# Patient Record
Sex: Male | Born: 1935 | Race: White | Hispanic: No | Smoking: Never smoker
Health system: Southern US, Community
[De-identification: ages and names within clinical notes are randomized; demographics above are authoritative.]

## PROBLEM LIST (undated history)

## (undated) DIAGNOSIS — Z860100 Personal history of colon polyps, unspecified: Secondary | ICD-10-CM

## (undated) DIAGNOSIS — Z9889 Other specified postprocedural states: Secondary | ICD-10-CM

## (undated) DIAGNOSIS — E785 Hyperlipidemia, unspecified: Secondary | ICD-10-CM

## (undated) DIAGNOSIS — F419 Anxiety disorder, unspecified: Secondary | ICD-10-CM

## (undated) DIAGNOSIS — K5790 Diverticulosis of intestine, part unspecified, without perforation or abscess without bleeding: Secondary | ICD-10-CM

## (undated) DIAGNOSIS — T7840XA Allergy, unspecified, initial encounter: Secondary | ICD-10-CM

## (undated) DIAGNOSIS — M5430 Sciatica, unspecified side: Secondary | ICD-10-CM

## (undated) DIAGNOSIS — E291 Testicular hypofunction: Secondary | ICD-10-CM

## (undated) DIAGNOSIS — Z8719 Personal history of other diseases of the digestive system: Secondary | ICD-10-CM

## (undated) HISTORY — DX: Anxiety disorder, unspecified: F41.9

## (undated) HISTORY — DX: Other specified postprocedural states: Z98.890

## (undated) HISTORY — DX: Personal history of colon polyps, unspecified: Z86.0100

## (undated) HISTORY — DX: Testicular hypofunction: E29.1

## (undated) HISTORY — DX: Diverticulosis of intestine, part unspecified, without perforation or abscess without bleeding: K57.90

## (undated) HISTORY — PX: HERNIA REPAIR: SHX51

## (undated) HISTORY — DX: Allergy, unspecified, initial encounter: T78.40XA

## (undated) HISTORY — DX: Sciatica, unspecified side: M54.30

## (undated) HISTORY — PX: CARPAL TUNNEL RELEASE: SHX101

## (undated) HISTORY — DX: Hyperlipidemia, unspecified: E78.5

## (undated) HISTORY — DX: Personal history of other diseases of the digestive system: Z87.19

## (undated) HISTORY — DX: Personal history of colonic polyps: Z86.010

---

## 1997-11-12 ENCOUNTER — Other Ambulatory Visit: Admission: RE | Admit: 1997-11-12 | Discharge: 1997-11-12 | Payer: Self-pay | Admitting: Orthopedic Surgery

## 2004-10-25 ENCOUNTER — Encounter: Admission: RE | Admit: 2004-10-25 | Discharge: 2004-10-25 | Payer: Self-pay | Admitting: Surgery

## 2004-10-27 ENCOUNTER — Ambulatory Visit (HOSPITAL_COMMUNITY): Admission: RE | Admit: 2004-10-27 | Discharge: 2004-10-27 | Payer: Self-pay | Admitting: Surgery

## 2004-10-27 ENCOUNTER — Ambulatory Visit (HOSPITAL_BASED_OUTPATIENT_CLINIC_OR_DEPARTMENT_OTHER): Admission: RE | Admit: 2004-10-27 | Discharge: 2004-10-28 | Payer: Self-pay | Admitting: Surgery

## 2005-11-08 ENCOUNTER — Ambulatory Visit: Payer: Self-pay | Admitting: Gastroenterology

## 2005-12-06 ENCOUNTER — Encounter (INDEPENDENT_AMBULATORY_CARE_PROVIDER_SITE_OTHER): Payer: Self-pay | Admitting: Specialist

## 2005-12-06 ENCOUNTER — Ambulatory Visit: Payer: Self-pay | Admitting: Gastroenterology

## 2006-05-24 ENCOUNTER — Ambulatory Visit: Payer: Self-pay | Admitting: Gastroenterology

## 2006-07-07 ENCOUNTER — Ambulatory Visit: Payer: Self-pay | Admitting: Internal Medicine

## 2007-05-30 ENCOUNTER — Encounter: Payer: Self-pay | Admitting: Internal Medicine

## 2007-07-19 ENCOUNTER — Encounter: Payer: Self-pay | Admitting: Internal Medicine

## 2007-07-19 DIAGNOSIS — Z860100 Personal history of colon polyps, unspecified: Secondary | ICD-10-CM | POA: Insufficient documentation

## 2007-07-19 DIAGNOSIS — Z9889 Other specified postprocedural states: Secondary | ICD-10-CM

## 2007-07-19 DIAGNOSIS — E785 Hyperlipidemia, unspecified: Secondary | ICD-10-CM

## 2007-07-19 DIAGNOSIS — E291 Testicular hypofunction: Secondary | ICD-10-CM

## 2007-07-19 DIAGNOSIS — Z8719 Personal history of other diseases of the digestive system: Secondary | ICD-10-CM | POA: Insufficient documentation

## 2007-07-19 DIAGNOSIS — R030 Elevated blood-pressure reading, without diagnosis of hypertension: Secondary | ICD-10-CM | POA: Insufficient documentation

## 2007-08-20 ENCOUNTER — Ambulatory Visit: Payer: Self-pay | Admitting: Internal Medicine

## 2007-08-20 DIAGNOSIS — K5730 Diverticulosis of large intestine without perforation or abscess without bleeding: Secondary | ICD-10-CM | POA: Insufficient documentation

## 2007-08-20 DIAGNOSIS — J309 Allergic rhinitis, unspecified: Secondary | ICD-10-CM | POA: Insufficient documentation

## 2007-08-20 DIAGNOSIS — E739 Lactose intolerance, unspecified: Secondary | ICD-10-CM | POA: Insufficient documentation

## 2007-08-20 DIAGNOSIS — M5430 Sciatica, unspecified side: Secondary | ICD-10-CM

## 2007-11-30 ENCOUNTER — Encounter: Payer: Self-pay | Admitting: Internal Medicine

## 2008-02-05 ENCOUNTER — Ambulatory Visit: Payer: Self-pay | Admitting: Internal Medicine

## 2008-02-05 LAB — CONVERTED CEMR LAB
HDL goal, serum: 40 mg/dL
LDL Goal: 100 mg/dL

## 2008-09-03 ENCOUNTER — Emergency Department (HOSPITAL_COMMUNITY): Admission: EM | Admit: 2008-09-03 | Discharge: 2008-09-03 | Payer: Self-pay | Admitting: Family Medicine

## 2009-01-01 ENCOUNTER — Ambulatory Visit: Payer: Self-pay | Admitting: Internal Medicine

## 2009-01-01 DIAGNOSIS — L719 Rosacea, unspecified: Secondary | ICD-10-CM

## 2009-01-01 DIAGNOSIS — F4322 Adjustment disorder with anxiety: Secondary | ICD-10-CM

## 2009-05-06 ENCOUNTER — Telehealth: Payer: Self-pay | Admitting: Internal Medicine

## 2009-09-04 ENCOUNTER — Emergency Department (HOSPITAL_COMMUNITY): Admission: EM | Admit: 2009-09-04 | Discharge: 2009-09-04 | Payer: Self-pay | Admitting: Family Medicine

## 2010-01-26 ENCOUNTER — Encounter: Payer: Self-pay | Admitting: Internal Medicine

## 2010-01-26 ENCOUNTER — Ambulatory Visit: Payer: Self-pay | Admitting: Internal Medicine

## 2010-01-26 LAB — CONVERTED CEMR LAB
BUN: 18 mg/dL (ref 6–23)
Basophils Relative: 0.7 % (ref 0.0–3.0)
Bilirubin, Direct: 0.2 mg/dL (ref 0.0–0.3)
CO2: 33 meq/L — ABNORMAL HIGH (ref 19–32)
Cholesterol: 215 mg/dL — ABNORMAL HIGH (ref 0–200)
Direct LDL: 136.2 mg/dL
Eosinophils Absolute: 0.3 10*3/uL (ref 0.0–0.7)
HCT: 46 % (ref 39.0–52.0)
HDL: 44.9 mg/dL (ref >39.00)
Hemoglobin: 15.6 g/dL (ref 13.0–17.0)
Lymphs Abs: 2.1 10*3/uL (ref 0.7–4.0)
MCHC: 33.8 g/dL (ref 30.0–36.0)
MCV: 93.7 fL (ref 78.0–100.0)
Monocytes Absolute: 0.6 10*3/uL (ref 0.1–1.0)
Neutro Abs: 4.2 10*3/uL (ref 1.4–7.7)
Neutrophils Relative %: 58.7 % (ref 43.0–77.0)
Platelets: 225 10*3/uL (ref 150.0–400.0)
RDW: 13.7 % (ref 11.5–14.6)
Sodium: 141 meq/L (ref 135–145)
Total Bilirubin: 0.8 mg/dL (ref 0.3–1.2)
Total CHOL/HDL Ratio: 5
Triglycerides: 173 mg/dL — ABNORMAL HIGH (ref 0.0–149.0)

## 2010-04-08 NOTE — Assessment & Plan Note (Signed)
Summary: wellness exam/#/cd   Vital Signs:  Patient profile:   77 year old male Height:      69 inches Weight:      178 pounds BMI:     26.38 O2 Sat:      96 % on Room air Temp:     98.4 degrees F oral Pulse rate:   64 / minute BP sitting:   130 / 60  (left arm) Cuff size:   regular  Vitals Entered By: Bill Salinas CMA (January 26, 2010 1:24 PM)  O2 Flow:  Room air  Primary Care Provider:  Jacques Navy MD   History of Present Illness: Patient presents for a preventive care visit. He reports that he is doing well. He does report that he has increased  flatus but he has no other complaints.  He is very active and 100% independent in ADLs. He lives alone and manages all his own affairs and business including balancing his checkbook. His daughters does help. He has had no falls and has no increased risk. He has no depression. He does have a girlfriend and they travel and have fun together.  Preventive Screening-Counseling & Management  Alcohol-Tobacco     Alcohol drinks/day: <1     Alcohol type: mixed drinks     >5/day in last 3 mos: yes     Smoking Status: quit     Year Quit: 1930s  Caffeine-Diet-Exercise     Caffeine use/day: none     Diet Comments: healthy     Does Patient Exercise: yes     Type of exercise: walking     Exercise (avg: min/session): <30     Times/week: <3  Hep-HIV-STD-Contraception     Hepatitis Risk: no risk noted     HIV Risk: no risk noted     STD Risk: no risk noted     Dental Visit-last 6 months no     Sun Exposure-Excessive: no  Safety-Violence-Falls     Seat Belt Use: yes     Helmet Use: n/a     Firearms in the Home: firearms in the home     Smoke Detectors: yes     Violence in the Home: no risk noted     Sexual Abuse: no     Fall Risk: slight fall risk      Drug Use:  never.        Blood Transfusions:  no.    Current Medications (verified): 1)  Vitamin D3 1000 Unit Tabs (Cholecalciferol) .... Take 1 Tablet By Mouth Once A  Day 2)  Aspirin 81 Mg  Tabs (Aspirin) .... Take 1 Tablet By Mouth Once A Day 3)  Fish Oil 1000 Mg Caps (Omega-3 Fatty Acids) .... Take 1 Tablet By Mouth Once A Day 4)  Multivitamins   Tabs (Multiple Vitamin) .... Take 1 Tablet By Mouth Once A Day 5)  Flax Seed Oil 1000 Mg Caps (Flaxseed (Linseed)) .... Take 1 Tablet By Mouth Three Times A Day 6)  Metronidazole 0.75 % Gel (Metronidazole) .... Apply Once A Day To Facial Rash Prn 7)  Alprazolam 0.5 Mg Tabs (Alprazolam) .Marland Kitchen.. 1 By Mouth Q 6 As Needed "nerves."  Allergies (verified): 1)  ! Penicillin 2)  ! Novocain 3)  ! * Bee Sting  Past History:  Past Medical History: ANXIETY DISORDER, SITUATIONAL, MILD (ICD-309.24) ROSACEA (ICD-695.3) DIVERTICULOSIS, COLON (ICD-562.10) GLUCOSE INTOLERANCE (ICD-271.3) ALLERGIC RHINITIS (ICD-477.9) SCIATICA, RIGHT (ICD-724.3) INGUINAL HERNIORRHAPHY, RIGHT, HX OF (ICD-V45.89) HEMORRHOIDS, HX OF (ICD-V12.79)  HYPOGONADISM (ICD-257.2) HYPERTENSION (ICD-401.9) HYPERLIPIDEMIA (ICD-272.4) COLONIC POLYPS, HX OF (ICD-V12.72)  Past Surgical History: Reviewed history from 02/05/2008 and no changes required. 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 He is dating- 50 y.o woman he knew her in high school.   Caffeine use/day:  none Does Patient Exercise:  yes Dental Care w/in 6 mos.:  no Sun Exposure-Excessive:  no Seat Belt Use:  yes Fall Risk:  slight fall risk Hepatitis Risk:  no risk noted HIV Risk:  no risk noted STD Risk:  no risk noted Drug Use:  never Blood Transfusions:  no  Review of Systems  The patient denies anorexia, fever, weight loss, weight gain, decreased hearing, syncope, peripheral edema, prolonged cough, hemoptysis, abdominal pain, incontinence, muscle weakness,  difficulty walking, unusual weight change, abnormal bleeding, and angioedema.         nocturia x 1  Physical Exam  General:  alert, well-developed, and well-nourished.   Head:  Normocephalic and atraumatic without obvious abnormalities. No apparent alopecia or balding. Eyes:  vision grossly intact, pupils equal, pupils round, and corneas and lenses clear.   Ears:  R ear normal and L ear normal.   Nose:  no external deformity and no external erythema.   Mouth:  upper denture, missing many lower teeth Neck:  supple, full ROM, and no thyromegaly.   Chest Wall:  no deformities and no tenderness.   Lungs:  Normal respiratory effort, chest expands symmetrically. Lungs are clear to auscultation, no crackles or wheezes. Heart:  Normal rate and regular rhythm. S1 and S2 normal without gallop, murmur, click, rub or other extra sounds. Abdomen:  soft, non-tender, no guarding, and no hepatomegaly.   Prostate:  deferred to age Msk:  normal ROM, no joint tenderness, no joint swelling, no joint warmth, and no joint deformities.   Pulses:  2+ radial pulses Extremities:  No clubbing, cyanosis, edema, or deformity noted with normal full range of motion of all joints.   Neurologic:  alert & oriented X3, cranial nerves II-XII intact, strength normal in all extremities, gait normal, and DTRs symmetrical and normal.   Skin:  turgor normal, color normal, no rashes, and no suspicious lesions.   Cervical Nodes:  no anterior cervical adenopathy and no posterior cervical adenopathy.   Psych:  Oriented X3, memory intact for recent and remote, normally interactive, good eye contact, and not anxious appearing.     Impression & Recommendations:  Problem # 1:  ROSACEA (ICD-695.3) stable without current flare  Problem # 2:  GLUCOSE INTOLERANCE (ICD-271.3) diet management only. Due for lab.  addendum - serum glucose 105 - normal range  Problem # 3:  HYPOGONADISM (ICD-257.2)  Orders: TLB-Testosterone, Total  (84403-TESTO)  testosterone low at 131.  Plan - patient candidate for testosterone replacement - will need to discuss options at return visit.  Problem # 4:  HYPERTENSION (ICD-401.9)  Orders: TLB-BMP (Basic Metabolic Panel-BMET) (80048-METABOL)  BP today: 130/60 Prior BP: 140/62 (01/01/2009)  Normal range BP - no need for medication at this level  Problem # 5:  HYPERLIPIDEMIA (ICD-272.4)  Orders: TLB-Lipid Panel (80061-LIPID) TLB-Hepatic/Liver Function Pnl (80076-HEPATIC) TLB-TSH (Thyroid Stimulating Hormone) (84443-TSH)  addendum - LDL 136  Plan - no indication for medical management           recommend heart healthy diet.  Problem # 6:  Preventive Health Care (ICD-V70.0)  Unremarkable interval history and a normal physical exam. Lab results are within normal limits except for low testosterone. Current with colorectal cancer screening with last colonoscopy in  Oct '07. Immunizations: Tetnus Dec '09; Pneumovax Dec '09. 12 lead EKG without evidence of active ischemia or injury.  In summary - a very nice man who is medically stable and doing well. He is advised/conseled to follow a heart healthy diet, to exercise on a regular basis and to win at the slots with my lucky quarter. He will return as needed or in 1 year.   Orders: Medicare -1st Annual Wellness Visit [phone])  Complete Medication List: 1)  Vitamin D3 1000 Unit Tabs (Cholecalciferol) .... Take 1 tablet by mouth once a day 2)  Aspirin 81 Mg Tabs (Aspirin) .... Take 1 tablet by mouth once a day 3)  Fish Oil 1000 Mg Caps (Omega-3 fatty acids) .... Take 1 tablet by mouth once a day 4)  Multivitamins Tabs (Multiple vitamin) .... Take 1 tablet by mouth once a day 5)  Flax Seed Oil 1000 Mg Caps (Flaxseed (linseed)) .... Take 1 tablet by mouth three times a day 6)  Metronidazole 0.75 % Gel (Metronidazole) .... Apply once a day to facial rash prn 7)  Alprazolam 0.5 Mg Tabs (Alprazolam) .Marland Kitchen.. 1 by mouth q 6 as needed  "nerves."  Other Orders: TLB-CBC Platelet - w/Differential (85025-CBCD)  Patient: Manuel Reilly Note: All result statuses are Final unless otherwise noted.  Tests: (1) Testosterone, Total (TESTO)   Testosterone         [L]  131.29 ng/dL                914.78-295.62  Tests: (2) BMP (METABOL)   Sodium                    141 mEq/L                   135-145   Potassium                 4.6 mEq/L                   3.5-5.1   Chloride                  103 mEq/L                   96-112   Carbon Dioxide       [H]  33 mEq/L                    19-32   Glucose              [H]  105 mg/dL                   13-08   BUN                       18 mg/dL                    6-57   Creatinine                0.8 mg/dL                   8.4-6.9   Calcium  9.2 mg/dL                   1.6-10.9   GFR                       103.66 mL/min               >60  Tests: (3) Lipid Panel (LIPID)   Cholesterol          [H]  215 mg/dL                   6-045     ATP III Classification            Desirable:  < 200 mg/dL                    Borderline High:  200 - 239 mg/dL               High:  > = 240 mg/dL   Triglycerides        [H]  173.0 mg/dL                 4.0-981.1     Normal:  <150 mg/dL     Borderline High:  914 - 199 mg/dL   HDL                       78.29 mg/dL                 >56.21   VLDL Cholesterol          34.6 mg/dL                  3.0-86.5  CHO/HDL Ratio:  CHD Risk                             5                    Men          Women     1/2 Average Risk     3.4          3.3     Average Risk          5.0          4.4     2X Average Risk          9.6          7.1     3X Average Risk          15.0          11.0                           Tests: (4) Hepatic/Liver Function Panel (HEPATIC)   Total Bilirubin           0.8 mg/dL                   7.8-4.6   Direct Bilirubin          0.2 mg/dL                   9.6-2.9   Alkaline Phosphatase      85 U/L  39-117    AST                       19 U/L                      0-37   ALT                       17 U/L                      0-53   Total Protein             6.8 g/dL                    1.6-1.0   Albumin                   4.1 g/dL                    9.6-0.4  Tests: (5) TSH (TSH)   FastTSH                   1.87 uIU/mL                 0.35-5.50  Tests: (6) CBC Platelet w/Diff (CBCD)   White Cell Count          7.2 K/uL                    4.5-10.5   Red Cell Count            4.91 Mil/uL                 4.22-5.81   Hemoglobin                15.6 g/dL                   54.0-98.1   Hematocrit                46.0 %                      39.0-52.0   MCV                       93.7 fl                     78.0-100.0   MCHC                      33.8 g/dL                   19.1-47.8   RDW                       13.7 %                      11.5-14.6   Platelet Count            225.0 K/uL                  150.0-400.0   Neutrophil %              58.7 %  43.0-77.0   Lymphocyte %              28.7 %                      12.0-46.0   Monocyte %                8.3 %                       3.0-12.0   Eosinophils%              3.6 %                       0.0-5.0   Basophils %               0.7 %                       0.0-3.0   Neutrophill Absolute      4.2 K/uL                    1.4-7.7   Lymphocyte Absolute       2.1 K/uL                    0.7-4.0   Monocyte Absolute         0.6 K/uL                    0.1-1.0  Eosinophils, Absolute                             0.3 K/uL                    0.0-0.7   Basophils Absolute        0.0 K/uL                    0.0-0.1  Tests: (7) Cholesterol LDL - Direct (DIRLDL)  Cholesterol LDL - Direct                             136.2 mg/dLPrescriptions: ALPRAZOLAM 0.5 MG TABS (ALPRAZOLAM) 1 by mouth q 6 as needed "nerves." Brand medically necessary #100 x 5   Entered and Authorized by:   Jacques Navy MD   Signed by:   Jacques Navy MD on 01/26/2010   Method  used:   Print then Give to Patient   RxID:   [id number] METRONIDAZOLE 0.75 % GEL (METRONIDAZOLE) apply once a day to facial rash prn  #60g x 1   Entered and Authorized by:   Jacques Navy MD   Signed by:   Jacques Navy MD on 01/26/2010   Method used:   Print then Give to Patient   RxID:   [id number]    Orders Added: 1)  Medicare -1st Annual Wellness Visit [G0438] 2)  Est. Patient Level III [84696] 3)  TLB-Testosterone, Total [84403-TESTO] 4)  TLB-BMP (Basic Metabolic Panel-BMET) [80048-METABOL] 5)  TLB-Lipid Panel [80061-LIPID] 6)  TLB-Hepatic/Liver Function Pnl [80076-HEPATIC] 7)  TLB-TSH (Thyroid Stimulating Hormone) [84443-TSH] 8)  TLB-CBC Platelet - w/Differential [85025-CBCD]  Appended Document: wellness exam/#/cd cognition - patient is highly functional in all realms of cognition - capable, competent and focused.

## 2010-04-08 NOTE — Progress Notes (Signed)
Summary: Alprazolam  Phone Note Refill Request Message from:  Fax from Pharmacy on May 06, 2009 2:53 PM  Refills Requested: Medication #1:  ALPRAZOLAM 0.5 MG TABS 1 by mouth q 6 as needed "nerves.". Initial call taken by: Lucious Groves,  May 06, 2009 2:53 PM  Follow-up for Phone Call        OK 2 ref OV w/Dr Laverne Klugh Follow-up by: Tresa Garter MD,  May 06, 2009 4:06 PM    Prescriptions: ALPRAZOLAM 0.5 MG TABS (ALPRAZOLAM) 1 by mouth q 6 as needed "nerves." Brand medically necessary #100 x 2   Entered by:   Lucious Groves   Authorized by:   Tresa Garter MD   Signed by:   Lucious Groves on 05/06/2009   Method used:   Telephoned to ...       [facility] [address] [phone]* (retail)       [address]       [place], [place]  [address]       Ph: [phone]       Fax: [phone]   RxID:   [id number]

## 2010-07-23 NOTE — Assessment & Plan Note (Signed)
[facility]                           PRIMARY CARE OFFICE NOTE   NIVIN, BRANIFF                    MRN:          [id number]  DATE:07/07/2006                            DOB:          1923/03/10    Mr. Manuel Reilly is an 77 year old Caucasian gentleman who presents to  establish for ongoing continuity care.  Patient has no active complaints  at today's visit.  Patient was formally followed by Dr. Mosetta Putt,  and because of insurance reasons, has had to change primary care  physicians.   PAST MEDICAL HISTORY:  1. Surgery:  Patient had a right herniorrhaphy in the 60s with a      repeat herniorrhaphy repair in a similar area in 2006.  2. Trauma:  None.  3. Patient had the usual childhood diseases.  4. Hypertension with lifestyle management.  5. Hyperlipidemia with lifestyle management.  6. Colon polyps.  7. Hemorrhoids.  8. Hypogonadism.   CURRENT MEDICATIONS:  1. Aspirin 81 mg daily.  2. __________ weekly.  3. AndroGel applied daily.   DRUG ALLERGIES:  PENICILLIN, NOVOCAIN, BEE STINGS.   FAMILY HISTORY:  Positive for arthritis.  Mother had colon cancer.  No  family history of lung cancer, prostate cancer, heart disease, diabetes.   SOCIAL HISTORY:  Patient is a high Garment/textile technologist.  He has been an  Multimedia programmer.  He has worked in a Education officer, environmental.  He has had  his own curb market.  He is presently considering himself retired.  He  has been married 57 years.  He has three daughters, five grandchildren,  and one great-grandchild.  Patient is totally independent in his  activities of daily living.   HABITS:  Alcohol none but a former drinker.  Tobacco none but a former  smoker.  Patient does exercise on a regular basis.   REVIEW OF SYSTEMS:  Patient has had no fevers, sweats, chills, or other  constitutional symptoms.  He has had an eye exam in approximately the  last 12 months.  HEENT:  The patient is edentulous on the  maxilla.  Has remaining  dentition on the mandible.  No hearing loss.  CARDIOVASCULAR:  The  patient has had palpitations in the past.  No chest pain, chest  discomfort, or limitations in activities.  The patient reports that he  has apneic spells as reported by his wife.  The patient does have  occasional bloating and flatus, but no other GI complaints.  GU is  negative with nocturia x0 to 1.  MUSCULOSKELETAL:  Significant for some  right arm pain.  Dermatologic and neurological is normal.   EXAMINATION:  Temperature was 98.1, blood pressure 132/69, pulse 75,  weight 185.4.  GENERAL APPEARANCE:  This is a well-nourished, well-developed gentleman  in no acute distress.  HEENT:  Normocephalic, atraumatic.  EACs and TMs normal.  The patient  has no oral lesions with dentition as noted in the review of systems.  Conjunctivae and sclerae clear.  Pupils are equal, round, and reactive  to light and accommodation.  NECK:  Supple without thyromegaly.  NODES:  No adenopathy  was noted in the cervical or supraclavicular  regions.  CHEST:  No CVA tenderness.  LUNGS:  Clear.  CARDIOVASCULAR:  2+ radial pulses.  No JVD or carotid bruits.  He had a  quiet precordium with a regular rate and rhythm without murmurs, rubs,  or gallops.  ABDOMEN:  Soft with no guarding or rebound.  No organo-splenomegaly was  noted.  No further examination conducted.   No laboratory at this time.   ASSESSMENT AND PLAN:  1. Hypertension.  The patient's blood pressure is well controlled on      present lifestyle regimen without need of medications and will      continue the same.  2. Lipids.  I have asked the patient to obtain records from Dr.      Duaine Dredge before I order laboratory.  3. Hypo-kinetism.  The patient was given refills of prescriptions for      vardenafil and AndroGel patches.  4. Health maintenance.  The patient had a colonoscopy October, I      believe, of 2007 by Dr. Claudette Head.  Report on the  chart      indicates colon polyps with diverticulosis with followup study      recommended in 10 years.  He otherwise is stable from a ti_      perspective.   SUMMARY:  He is a very pleasant gentleman who is established for ongoing  continuity care.  He was oriented to the practice, our call service, our  hospital service, and issues of access.  I have asked him to return to  see me on a p.r.n. basis or in 6 months for followup.     Rosalyn Gess Norins, MD  Electronically Signed    MEN/MedQ  DD: 07/08/2006  DT: 07/08/2006  Job #: [id number]   cc:   Angelyn Punt

## 2010-07-23 NOTE — Op Note (Signed)
NAMECHALMERS, IDDINGS             ACCOUNT NO.:  [id number]   MEDICAL RECORD NO.:  [id number]          PATIENT TYPE:  AMB   LOCATION:  [facility]                          FACILITY:  [facility]   PHYSICIAN:  Thornton Park. Daphine Deutscher, MD  DATE OF BIRTH:  1924-01-18   DATE OF PROCEDURE:  10/27/2004  DATE OF DISCHARGE:                                 OPERATIVE REPORT   PREOPERATIVE DIAGNOSIS:  Recurrent right inguinal hernia.   POSTOPERATIVE DIAGNOSIS:  P[place]aloon type hernia involving internal ring and  a medial more Spigelian type hernia in the right lower quadrant.   PROCEDURE:  Repair with mesh.   SURGEON:  Thornton Park. Daphine Deutscher, M.D.   ANESTHESIA:  General.   DESCRIPTION OF PROCEDURE:  Mr. Grover is a 77 year old man who had a right  inguinal hernia repair by Dr. Leona Singleton many years ago. Recently, he has noticed  a bulge and squishing feeling in his right lower quadrant. He was seen and  referred. He was taken back to room #4 at [facility], given  general anesthesia. The abdomen was prepped with Betadine and draped  sterilely. I had previously marked the area and made a transverse incision  cut down on this. There I found a dilated ring with evidence of  properitoneal recurrence. When I freed this up and put my finger into this,  I could feel a second defect medially which I cut down on and demonstrated  via the external oblique. I then dissected that sac inside and reduced it. I  put a piece of Prolene mesh system which is a dual layer mesh, inserted it  from the internal ring and pulled it out and used it to buttress the medial  hole suturing it to the edge with some horizontal mattress sutures. I then,  after closing that and closing that layer, laid the outside layer on top of  the external oblique and then tacked it to the anterior fascia with  interrupted 2-0 Prolenes. External oblique had been closed. The wound was  irrigated. It was infiltrated with 0.5% Marcaine and then the wound  was  closed 4-0 Vicryl subcutaneously and 5-0 Vicryl subcuticularly with Benzoin  and Steri-Strips. The patient seemed to tolerate the procedure well and was  taken to the recovery room in satisfactory condition.      Thornton Park Daphine Deutscher, MD  Electronically Signed     MBM/MEDQ  D:  10/27/2004  T:  10/27/2004  Job:  [id number]   cc:   Mosetta Putt, M.D.  [address] [place]  [place] [address]  Fax: [phone]

## 2010-07-23 NOTE — Assessment & Plan Note (Signed)
[facility]                         GASTROENTEROLOGY OFFICE NOTE   AMOR, PACKARD                    MRN:          [id number]  DATE:05/24/2006                            DOB:          [birth year]    Mr. Moring relates problems with significant intestinal gas and flatus.  He notes no diarrhea or constipation. He does relate frequent bloating.  These symptoms have been present for many years and do not appear to  have changed. He has a history of adenomatous colon polyps and his last  colonoscopy was in October 2007, which showed a tubulovillous adenoma.  He relates no rectal bleeding or change in bowel habit. He is changing  primary physicians as Dr. Duaine Dredge no longer takes his insurance.   CURRENT MEDICATIONS:  Listed on the chart, updated and reviewed.   ALLERGIES:  PENICILLIN  BEE STINGS  NOVOCAIN   PHYSICAL EXAMINATION:  GENERAL:  No acute distress.  VITAL SIGNS:  Weight 185 pounds, blood pressure 138/60, pulse 84 and  regular.  CHEST:  Clear to auscultation bilaterally.  CARDIAC:  Regular rate and rhythm without murmurs.  ABDOMEN:  Soft and nontender.   ASSESSMENT AND PLAN:  1. Gas, bloating, and flatulence. He is to begin a lactose free diet      for one week to assess the impact on his symptoms. If this not      effective, he will begin a standard low gas diet with the use of      Gas-X daily p.r.n. If this is ineffective, he will consider a trial      of Xifaxan.  2. Personal history of tubulovillous adenomatous colon polyps. Recall      colonoscopy for October 2010.  3. Primary care physician referral to [facility], as the      patient is changing physicians due to insurance reasons.     Venita Lick. Russella Dar, MD, [facility]  Electronically Signed    MTS/MedQ  DD: 05/24/2006  DT: 05/25/2006  Job #: [id number]

## 2010-07-23 NOTE — Assessment & Plan Note (Signed)
[facility]                           GASTROENTEROLOGY OFFICE NOTE   NAME:COLEMANJobe Igo                    MRN:          [id number]  DATE:11/08/2005                            DOB:          [birth year]    CHIEF COMPLAINT:  An 77 year old white male who is self referred for  evaluation of intermittent diarrhea associated with gas and bloating.  He  has a family history of colon cancer.  His mother developed colon cancer in  her 2s and he has two brothers with colon polyps.  Last colonoscopy was  performed in July 2004 which showed diverticulosis, internal hemorrhoids and  two adenomatous colon polyps.  Blood work performed in May of this year at  Dr. Geoffery Lyons office revealed a normal CBC, CMET, TSH and PSA except for  minimally  elevated glucose at 130.  He denies any melena, hematochezia,  change in stool caliber, weight loss, fevers, chills, nausea or vomiting.   PAST MEDICAL HISTORY:  1. Hypertension.  2. Arthritis.  3. Allergies.  4. Erectile dysfunction.  5. Elevated glucose.  6. Status post right inguinal hernia repair.  7. Known left inguinal hernia - not repaired.  8. Status post right carpal tunnel release.   MEDICATIONS:  On the chart and reviewed.   ALLERGIES:  PENICILLIN leading to a rash and shortness of breath.  NOVOCAIN.   SOCIAL HISTORY AND REVIEW OF SYSTEMS:  Per the hand written evaluation form.   EXAMINATION:  GENERAL:  In now acute distress.  VITAL SIGNS:  Height 5 feet 10 inches.  Weight 183.6 pounds.  Blood pressure  is 124/48.  Pulse 74 and regular.  HEENT:  Anicteric sclerae.  . Oropharynx clear.  CHEST:  Clear to auscultation bilaterally.  CARDIAC:  Regular rate and rhythm without murmurs appreciated.  ABDOMEN:  Soft, non-tender, non-distended.  Normal active bowel sounds, no  organomegaly, no masses. Small left inguinal hernia is appreciated.  EXTREMITIES:  Are without clubbing, cyanosis, or edema.  NEUROLOGIC:  Alert and oriented x3.  Cranial nerves II-XII are intact.   ASSESSMENT/PLAN:  Change in bowel habits with increased diarrhea with  intermittent gas.  Family history has colon cancer, family history of colon  polyps, personal history of colon polyps.  He was begun on a low gas diet  and decrease milk product intake.  He is to avoid carbonated  beverages. Risks, benefits and alternatives to coloscopy, possible biopsy,  possible polypectomy was discussed with the patient and he consents to  proceed. He has been scheduled electively.                                   Venita Lick. Russella Dar, MD, Clementeen Graham   MTS/MedQ  DD:  11/16/2005  DT:  11/17/2005  Job #:  [id number]   cc:   Mosetta Putt, M.D.

## 2010-07-23 NOTE — Assessment & Plan Note (Signed)
[facility]                           PRIMARY CARE OFFICE NOTE   NAME:COLEMANJobe Igo                    MRN:          [id number]  DATE:07/08/2006                            DOB:          Feb 19, 1924    This is Dr. Debby Bud continuing a dictation that was interrupted on Mr.  Myrick Mcnairy.  __________ try to make this a continuous dictation.   REVIEW OF SYSTEMS:  HEENT:  The patient is edentulous on the maxilla.  Has remaining dentition on the mandible.  No hearing loss.  CARDIOVASCULAR:  The patient has had palpitations in the past.  No chest  pain, chest discomfort, or limitations in activities.  The patient  reports that he has apneic spells as reported by his wife.  The patient  does have occasional bloating and flatus, but no other GI complaints.  GU is negative with nocturia x0 to 1.  MUSCULOSKELETAL:  Significant for  some right arm pain.  Dermatologic and neurological is normal.   EXAMINATION:  Temperature was 98.1, blood pressure 132/69, pulse 75,  weight 185.4.  GENERAL APPEARANCE:  This is a well-nourished, well-developed gentleman  in no acute distress.  HEENT:  Normocephalic, atraumatic.  EACs and TMs normal.  The patient  has no oral lesions with dentition as noted in the review of systems.  Conjunctivae and sclerae clear.  Pupils are equal, round, and reactive  to light and accommodation.  NECK:  Supple without thyromegaly.  NODES:  No adenopathy was noted in the cervical or supraclavicular  regions.  CHEST:  No CVA tenderness.  LUNGS:  Clear.  CARDIOVASCULAR:  2+ radial pulses.  No JVD or carotid bruits.  He had a  quiet precordium with a regular rate and rhythm without murmurs, rubs,  or gallops.  ABDOMEN:  Soft with no guarding or rebound.  No organo-splenomegaly was  noted.  No further examination conducted.   No laboratory at this time.   ASSESSMENT AND PLAN:  1. Hypertension.  The patient's blood pressure is well  controlled on      present lifestyle regimen without need of medications and will      continue the same.  2. Lipids.  I have asked the patient to obtain records from Dr.      Duaine Dredge before I order laboratory.  3. Hypo-kinetism.  The patient was given refills of prescriptions for      vardenafil and AndroGel patches.  4. Health maintenance.  The patient had a colonoscopy October, I      believe, of 2007 by Dr. Claudette Head.  Report on the chart      indicates colon polyps with diverticulosis with followup study      recommended in 10 years.  He otherwise is stable from a ti_      perspective.   SUMMARY:  He is a very pleasant gentleman who is established for ongoing  continuity care.  He was oriented to the practice, our call service, our  hospital service, and issues of access.  I have asked him to return to  see me on a p.r.n. basis  or in 6 months for followup.     Rosalyn Gess Norins, MD  Electronically Signed    MEN/MedQ  DD: 07/08/2006  DT: 07/08/2006  Job #: [id number]   cc:   Angelyn Punt

## 2012-04-03 ENCOUNTER — Encounter (HOSPITAL_COMMUNITY): Payer: Self-pay | Admitting: *Deleted

## 2012-04-03 ENCOUNTER — Emergency Department (HOSPITAL_COMMUNITY)
Admission: EM | Admit: 2012-04-03 | Discharge: 2012-04-03 | Disposition: A | Discharge status: Home / Self Care | Payer: Medicare Other | Source: Home / Self Care | Attending: Emergency Medicine | Admitting: Emergency Medicine

## 2012-04-03 DIAGNOSIS — J209 Acute bronchitis, unspecified: Secondary | ICD-10-CM

## 2012-04-03 MED ORDER — AZITHROMYCIN 250 MG PO TABS: 250.0000 mg | ORAL_TABLET | Freq: Every day | ORAL | Status: DC

## 2012-04-03 MED ORDER — PREDNISONE 20 MG PO TABS: 20.0000 mg | ORAL_TABLET | Freq: Every day | ORAL | Status: AC

## 2012-04-03 MED ORDER — GUAIFENESIN-CODEINE 100-10 MG/5ML PO SYRP: 5.0000 mL | ORAL_SOLUTION | Freq: Three times a day (TID) | ORAL | Status: DC | PRN

## 2012-04-03 NOTE — ED Notes (Signed)
Pt  Reports  Symptoms  Of  Cough  /  Congestion  X   4  Days   With  Some  Shortness  Of  Breath  As  Well     Pt  Reports  The   Symptoms  Not  releived  By otc  meds   The  Pt  Is   Awake  And  Alert and  Is  Sitting  Upright on  Exam table   No  History  Of  Any  Copd

## 2012-04-03 NOTE — ED Provider Notes (Signed)
History     CSN: [id number]  Arrival date & time 04/03/12  1136   First MD Initiated Contact with Patient 04/03/12 1159      Chief Complaint  Patient presents with  . Cough    (Consider location/radiation/quality/duration/timing/severity/associated sxs/prior treatment) HPI Comments: Patient presents urgent care describing for about 4 days he's been coughing and congestion of his nose. He has some shortness of breath. You feel like having a cold, some sore throat that he had to do gargles with water and peroxide. Symptoms have subsided now feel better mild nasal congestion, denies any fevers chest pains, nausea vomiting or diarrheas. No feels his chest congestions getting up some phlegm.  Patient is a 79 y.o. male presenting with cough. The history is provided by the patient.  Cough This is a new problem. The current episode started more than 2 days ago. The problem occurs constantly. The problem has been gradually worsening. The cough is productive of sputum. Associated symptoms include chills, rhinorrhea, sore throat, myalgias and shortness of breath. Pertinent negatives include no wheezing. The treatment provided no relief.    History reviewed. No pertinent past medical history.  Past Surgical History  Procedure Date  . Hernia repair     No family history on file.  History  Substance Use Topics  . Smoking status: Not on file  . Smokeless tobacco: Not on file  . Alcohol Use:       Review of Systems  Constitutional: Positive for fever, chills, diaphoresis, activity change and appetite change.  HENT: Positive for sore throat and rhinorrhea. Negative for mouth sores, neck pain, neck stiffness and sinus pressure.   Respiratory: Positive for cough and shortness of breath. Negative for wheezing.   Musculoskeletal: Positive for myalgias.  Skin: Negative for rash.  Neurological: Negative for dizziness.    Allergies  Penicillins and Procaine hcl  Home Medications    Current Outpatient Rx  Name  Route  Sig  Dispense  Refill  . AZITHROMYCIN 250 MG PO TABS   Oral   Take 1 tablet (250 mg total) by mouth daily. Take first 2 tablets together, then 1 every day until finished.   6 tablet   0   . GUAIFENESIN-CODEINE 100-10 MG/5ML PO SYRP   Oral   Take 5 mLs by mouth 3 (three) times daily as needed for cough.   120 mL   0   . PREDNISONE 20 MG PO TABS   Oral   Take 1 tablet (20 mg total) by mouth daily.   5 tablet   0     BP 139/54  Pulse 70  Temp 98.5 F (36.9 C) (Oral)  Resp 16  SpO2 95%  Physical Exam  Vitals reviewed. Constitutional: Vital signs are normal. He appears well-developed and well-nourished.  Non-toxic appearance. He does not have a sickly appearance. He does not appear ill.  HENT:  Head: Normocephalic.  Right Ear: Tympanic membrane normal.  Left Ear: Tympanic membrane normal.  Mouth/Throat: Mucous membranes are normal. Posterior oropharyngeal erythema present.  Eyes: Conjunctivae normal are normal. Pupils are equal, round, and reactive to light. No scleral icterus.  Neck: Neck supple. No JVD present.  Cardiovascular: Normal rate and regular rhythm.  Exam reveals no gallop and no friction rub.   No murmur heard. Pulmonary/Chest: Effort normal and breath sounds normal. No respiratory distress. He has no decreased breath sounds. He has no wheezes. He has no rhonchi. He has no rales. He exhibits no tenderness.  Lymphadenopathy:  He has no cervical adenopathy.  Neurological: He is alert.  Skin: Rash noted.    ED Course  Procedures (including critical care time)  Labs Reviewed - No data to display No results found.   1. Bronchitis, acute       MDM  Patient looks comfortable. In no respiratory distress afebrile. Have encouraged patient to symptomatically treat his symptoms with both a cough syrup and prednisone for the next couple days. If no improvement worsening symptoms to either return or start with provided  condition antibiotic.        Jimmie Molly, MD 04/03/12 1416

## 2012-04-21 ENCOUNTER — Other Ambulatory Visit: Payer: Self-pay

## 2012-11-26 ENCOUNTER — Ambulatory Visit (INDEPENDENT_AMBULATORY_CARE_PROVIDER_SITE_OTHER): Payer: Medicare Other | Admitting: Internal Medicine

## 2012-11-26 ENCOUNTER — Ambulatory Visit (INDEPENDENT_AMBULATORY_CARE_PROVIDER_SITE_OTHER)
Admission: RE | Admit: 2012-11-26 | Discharge: 2012-11-26 | Disposition: A | Discharge status: Home / Self Care | Payer: Medicare Other | Source: Ambulatory Visit | Attending: Internal Medicine | Admitting: Internal Medicine

## 2012-11-26 ENCOUNTER — Encounter: Payer: Self-pay | Admitting: Internal Medicine

## 2012-11-26 VITALS — BP 138/52 | HR 72 | Temp 99.0°F | Wt 182.4 lb

## 2012-11-26 DIAGNOSIS — J449 Chronic obstructive pulmonary disease, unspecified: Secondary | ICD-10-CM

## 2012-11-26 NOTE — Progress Notes (Signed)
Subjective:    Patient ID: Manuel Reilly, male    DOB: [birth year], 79 y.o.   MRN: [id number]  HPI Mr. Rathgeber was last seen Nov 2011. In the interval he has been doing OK. He was seen at urgent care 1 year ago for bronchitis, treated and resolved. He was seen at [facility] April 16, '14 for sinusitis/bronchitis, Sept 2, '14 for recurrent bronchitis.Records reviewed.  At the last visit he was treated with doxycyline. He denied having fever at that time, cough was minimally productive but he was feeling a little SOB. He reports that cough did clear up. He reports he never did feel bad.   He returns today due to concern about his condition and the need for a follow up chest x-ray. He currently has no fever, no cough, no increased SOB  IN the 3 year interval he has otherwise been well.   Past Medical History  Diagnosis Date  . Anxiety   . Allergy   . Hypertension   . Hyperlipidemia   . History of colon polyps   . Hypogonadism, male   . History of hemorrhoids   . Sciatica     right  . History of inguinal herniorrhaphy     right  . Diverticulosis    Past Surgical History  Procedure Laterality Date  . Hernia repair    . Carpal tunnel release     Family History  Problem Relation Age of Onset  . Cancer Mother     colon  . Cancer Father     prostate  . Colon polyps Brother   . Diabetes Brother   . Heart disease Brother    History   Social History  . Marital Status: Widowed    Spouse Name: N/A    Number of Children: N/A  . Years of Education: N/A   Occupational History  . Not on file.   Social History Main Topics  . Smoking status: Never Smoker   . Smokeless tobacco: Not on file  . Alcohol Use: Yes  . Drug Use: No  . Sexual Activity: Not on file   Other Topics Concern  . 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   No current facility-administered medications on file prior to visit.      Review of Systems System review is negative for any constitutional, cardiac, pulmonary, GI or neuro symptoms or complaints other than as described in the HPI.      Objective:   Physical Exam Filed Vitals:   11/26/12 1639  BP: 138/52  Pulse: 72  Temp: 99 F (37.2 C)   Wt Readings from Last 3 Encounters:  11/26/12 182 lb 6.4 oz (82.736 kg)  01/26/10 178 lb (80.74 kg)  01/01/09 181 lb (82.101 kg)   Gen'l - elderly white man in no distress looks younger than his stated age.  HEENT C&S clear Cor - 2+ radial pulse, RRR but distant Heart sounds PUlm - increased AP diameter, no increased WOB, Breath sounds diminished, no rales or wheezing  Neuro - A&O x 3, normal get up and go, normal gait.        Assessment &  Plan:

## 2012-11-26 NOTE — Assessment & Plan Note (Signed)
Patient recurrent bronchitis with 3 events in the last year - followed at [facility]. No major DOE/SOB  Plan CXR with recommendations to follow - may need PFTs.

## 2012-11-26 NOTE — Patient Instructions (Addendum)
Chronic bronchitis - question of COPD/chronic bronchitis  Plan  No need for medical treatment today  Chest x-ray with recommendations to follow.   Chronic Obstructive Pulmonary Disease Exacerbation Chronic obstructive pulmonary disease (COPD) is a condition that limits airflow. COPD may include chronic bronchitis, pulmonary emphysema, or both. COPD exacerbation means that your COPD has gotten worse. Without treatment, this can be a life-threatening problem. COPD exacerbation requires immediate medical care. CAUSES  COPD exacerbation can be caused by:  Exposure to smoke.  Exposure to air pollution, chemical fumes, or dust.  Respiratory infections.  Genetics, particularly alpha 1-antitrypsin deficiency.  A condition in which the body's immune system attacks itself (autoimmunity). SYMPTOMS   Increased coughing.  Increased wheezing.  Increased shortness of breath.  Swelling due to a buildup of fluid (peripheral edema) related to heart strain.  Rapid breathing.  Chest enlargement (barrel chest).  Chest tightness. DIAGNOSIS  There is no single test that can diagnosis COPD exacerbation. Your history, physical exam, and other tests will help your caregiver make a diagnosis. Tests may include a chest X-ray, pulmonary function tests, spirometry, basic lab tests, and an arterial blood gas test. TREATMENT  Severe problems may require a stay in the hospital. Depending on the cause of your problems, the following may be prescribed:  Antibiotic medicines.  Bronchodilators (inhaled or tablets).  Cortisone medicines (inhaled or tablets).  Supplemental oxygen therapy.  Pulmonary rehabilitation. This is a broad program that may involve exercise, nutrition counseling, breathing techniques, and further education about your condition. It is important to use good technique with inhaled medicines. Spacer devices may be needed to help improve drug delivery. HOME CARE INSTRUCTIONS   Do not  smoke. Quitting smoking is very important to prevent worsening of COPD.  Avoid exposure to all substances that irritate the airway, especially tobacco smoke.  If prescribed, take your antibiotics as directed. Finish them even if you start to feel better.  Only take over-the-counter or prescription medicines as directed by your caregiver.  Drink enough fluids to keep your urine clear or pale yellow. This can help thin bronchial secretions.  Use a cool mist vaporizer. This makes it easier to clear your chest when you cough.  If you have a home nebulizer and oxygen, continue to use them as directed.  Maintain all necessary vaccinations to prevent infections.  Exercise regularly.  Eat a healthy diet.  Keep all follow-up appointments as directed by your caregiver. SEEK IMMEDIATE MEDICAL CARE IF:  You have extreme shortness of breath.  You have trouble talking.  You have severe chest pain or blood in your sputum.  You have a high fever, weakness, repeated vomiting, or fainting.  You feel confused.  You keep getting worse. MAKE SURE YOU:   Understand these instructions.  Will watch your condition.  Will get help right away if you are not doing well or get worse. Document Released: 12/19/2006 Document Revised: 05/16/2011 Document Reviewed: 10/19/2010 [facility] Patient Information 2014 [place], [place].

## 2012-12-04 ENCOUNTER — Encounter: Payer: Self-pay | Admitting: Internal Medicine

## 2012-12-04 ENCOUNTER — Ambulatory Visit (INDEPENDENT_AMBULATORY_CARE_PROVIDER_SITE_OTHER): Payer: Medicare Other | Admitting: Internal Medicine

## 2012-12-04 VITALS — BP 158/62 | HR 77 | Temp 98.8°F | Wt 179.0 lb

## 2012-12-04 DIAGNOSIS — J449 Chronic obstructive pulmonary disease, unspecified: Secondary | ICD-10-CM

## 2012-12-04 MED ORDER — SILDENAFIL CITRATE 50 MG PO TABS: 50.0000 mg | ORAL_TABLET | Freq: Every day | ORAL | Status: DC | PRN

## 2012-12-04 NOTE — Patient Instructions (Addendum)
Thanks for coming in.  Your exam is normal.  Will check liver functions and abdominal U/S as follow up of fatty liver.  Flu shot today - this should be acceptable to [facility].  You need an eye exam please - checking for glaucoma  Return in 2 years for routine follow up sooner as needed.

## 2012-12-04 NOTE — Assessment & Plan Note (Signed)
Patient with senile emphysema, chronic recurrent bronchitis. He is not in need of any chronic therapy.  Plan - advised to return at first sign of any respiratory infection  Continue to walk and be active.

## 2012-12-04 NOTE — Progress Notes (Signed)
  Subjective:    Patient ID: Manuel Reilly, male    DOB: 1923-12-02, 78 y.o.   MRN: [id number]  HPI Mr. Rasmusson presents to discuss x-ray findings. He was seen for acute bronchitis on 9/22. He did well with antibiotic with complete resolution of his symptoms. Xray that date did reveal COPD and mild emphysematous change. Reviewed CXR from '06 - also with COPD on report.   He is otherwise doing very well, on his way to [place] to push the buttons and have a good time.   PMH, FamHx and SocHx reviewed for any changes and relevance.  Current Outpatient Prescriptions on File Prior to Visit  Medication Sig Dispense Refill  . aspirin 81 MG tablet Take 81 mg by mouth daily.      . cholecalciferol (VITAMIN D) 1000 UNITS tablet Take 1,000 Units by mouth daily.      . Cyanocobalamin (VITAMIN B 12 PO) Take 1,000 mcg by mouth daily.      . fish oil-omega-3 fatty acids 1000 MG capsule Take 2 g by mouth daily.      . Multiple Vitamin (MULTI-VITAMIN DAILY PO) Take 1 tablet by mouth daily.       No current facility-administered medications on file prior to visit.       Review of Systems Rosb     Objective:   Physical Exam Filed Vitals:   12/04/12 0922  BP: 158/62  Pulse: 77  Temp: 98.8 F (37.1 C)   BP Readings from Last 3 Encounters:  12/04/12 158/62  11/26/12 138/52  04/03/12 139/54   Gen'l - WNWD elderly man looking younger than his stated age. HEENT- C&S clear, missing dentition Cor - 2+, RRR, no murmurs PUlm - no increased WOB, no rales or wheezes Neuro - intact, alert, ambulates normally w/o assist.       Assessment & Plan:

## 2013-08-06 ENCOUNTER — Telehealth: Payer: Self-pay | Admitting: Internal Medicine

## 2013-08-06 NOTE — Telephone Encounter (Signed)
rec'd records form [facility]., Forwarding 4 pages to Dr.John,James

## 2013-12-13 ENCOUNTER — Other Ambulatory Visit (INDEPENDENT_AMBULATORY_CARE_PROVIDER_SITE_OTHER): Payer: Medicare Other

## 2013-12-13 ENCOUNTER — Ambulatory Visit (INDEPENDENT_AMBULATORY_CARE_PROVIDER_SITE_OTHER): Payer: Medicare Other | Admitting: Internal Medicine

## 2013-12-13 ENCOUNTER — Encounter: Payer: Self-pay | Admitting: Internal Medicine

## 2013-12-13 VITALS — BP 140/62 | HR 72 | Temp 98.8°F | Resp 18 | Ht 70.0 in | Wt 180.8 lb

## 2013-12-13 DIAGNOSIS — Z0000 Encounter for general adult medical examination without abnormal findings: Secondary | ICD-10-CM

## 2013-12-13 DIAGNOSIS — E785 Hyperlipidemia, unspecified: Secondary | ICD-10-CM

## 2013-12-13 DIAGNOSIS — J449 Chronic obstructive pulmonary disease, unspecified: Secondary | ICD-10-CM

## 2013-12-13 DIAGNOSIS — R030 Elevated blood-pressure reading, without diagnosis of hypertension: Secondary | ICD-10-CM

## 2013-12-13 LAB — BASIC METABOLIC PANEL
BUN: 19 mg/dL (ref 6–23)
CALCIUM: 9.3 mg/dL (ref 8.4–10.5)
CO2: 27 meq/L (ref 19–32)
CREATININE: 0.9 mg/dL (ref 0.4–1.5)
Chloride: 109 mEq/L (ref 96–112)
GFR: 85.61 mL/min (ref >60.00)
GLUCOSE: 92 mg/dL (ref 70–99)
POTASSIUM: 4.4 meq/L (ref 3.5–5.1)
SODIUM: 141 meq/L (ref 135–145)

## 2013-12-13 LAB — LIPID PANEL
CHOL/HDL RATIO: 6
CHOLESTEROL: 207 mg/dL — AB (ref 0–200)
HDL: 34.7 mg/dL — ABNORMAL LOW (ref >39.00)
NonHDL: 172.3
TRIGLYCERIDES: 275 mg/dL — AB (ref 0.0–149.0)
VLDL: 55 mg/dL — ABNORMAL HIGH (ref 0.0–40.0)

## 2013-12-13 MED ORDER — ALPRAZOLAM 0.25 MG PO TABS: 0.2500 mg | ORAL_TABLET | Freq: Every evening | ORAL | Status: DC | PRN

## 2013-12-13 NOTE — Assessment & Plan Note (Signed)
He is not symptomatic from this and not breathless so will not pursue further workup at this time.

## 2013-12-13 NOTE — Assessment & Plan Note (Signed)
Check lipid panel today 

## 2013-12-13 NOTE — Patient Instructions (Signed)
We will have you come back in about 1 year if you are doing well. If you have any problems please come back sooner or call us.  You are doing great!  We will check your blood work today and call you with the results.

## 2013-12-13 NOTE — Assessment & Plan Note (Signed)
BP normal today, not on medicines will observe.

## 2013-12-13 NOTE — Progress Notes (Signed)
Pre visit review using our clinic review tool, if applicable. No additional management support is needed unless otherwise documented below in the visit note. 

## 2013-12-13 NOTE — Progress Notes (Signed)
   Subjective:    Patient ID: Manuel Reilly, male    DOB: [birth year], 80 y.o.   MRN: [id number]  HPI The patient is an 80 YO man who is coming in today to establish care. He has PMH of high blood pressure without hypertension, allergies, chronic bronchitis. He has something on his face that he would like me to look at. He denies chest pains, SOB, abdominal pain, constipation, diarrhea. He was in the [employer] so he has decreased hearing as a result of noise exposure and uses hearing aid with good success. No problems with his balance and no recent falls. He does have a girlfriend and no mood problems.   Review of Systems  Constitutional: Negative for fever, activity change, appetite change and fatigue.  HENT: Negative.   Eyes: Negative.   Respiratory: Negative for cough, chest tightness, shortness of breath and wheezing.   Cardiovascular: Negative for chest pain, palpitations and leg swelling.  Gastrointestinal: Negative for abdominal pain, diarrhea, constipation and abdominal distention.  Genitourinary: Negative.   Musculoskeletal: Negative.   Neurological: Negative.   Psychiatric/Behavioral: Negative.        Objective:   Physical Exam  Constitutional: He is oriented to person, place, and time. He appears well-developed and well-nourished.  HENT:  Head: Normocephalic and atraumatic.  Eyes: EOM are normal.  Neck: Normal range of motion.  Cardiovascular: Normal rate and regular rhythm.   Pulmonary/Chest: Effort normal and breath sounds normal. No respiratory distress. He has no wheezes. He has no rales.  Abdominal: Soft. Bowel sounds are normal. He exhibits no distension. There is no tenderness. There is no rebound.  Neurological: He is alert and oriented to person, place, and time. Coordination normal.  Skin: Skin is warm and dry.  Small lesion on the cheek left side, not worrisome for cancer will follow at next visit.   Filed Vitals:   12/13/13 1543  BP: 140/62  Pulse: 72  Temp:  98.8 F (37.1 C)  TempSrc: Oral  Resp: 18  Height: 5\' 10"  (1.778 m)  Weight: 180 lb 12.8 oz (82.01 kg)  SpO2: 90%      Assessment & Plan:  Flu shot given at today's visit.

## 2013-12-16 LAB — LDL CHOLESTEROL, DIRECT: LDL DIRECT: 132.9 mg/dL

## 2013-12-17 ENCOUNTER — Telehealth: Payer: Self-pay | Admitting: Internal Medicine

## 2013-12-17 NOTE — Telephone Encounter (Signed)
Patient would like the results of the labs that were drawn recently.

## 2013-12-18 NOTE — Telephone Encounter (Signed)
Patient returned your call.

## 2013-12-18 NOTE — Telephone Encounter (Signed)
Left message for patient to call me back. 

## 2014-01-06 ENCOUNTER — Encounter: Payer: Self-pay | Admitting: Gastroenterology

## 2014-05-12 DIAGNOSIS — J018 Other acute sinusitis: Secondary | ICD-10-CM | POA: Diagnosis not present

## 2014-05-15 ENCOUNTER — Telehealth: Payer: Self-pay | Admitting: Internal Medicine

## 2014-05-15 NOTE — Telephone Encounter (Signed)
rec'd records from [facility] Forwarding 4pages to Dr.John

## 2014-05-19 ENCOUNTER — Telehealth: Payer: Self-pay | Admitting: Internal Medicine

## 2014-05-19 NOTE — Telephone Encounter (Signed)
Rec'd from [facility] forward 4 pages  to Dr. John °

## 2014-06-05 DIAGNOSIS — J209 Acute bronchitis, unspecified: Secondary | ICD-10-CM | POA: Diagnosis not present

## 2014-06-05 DIAGNOSIS — J018 Other acute sinusitis: Secondary | ICD-10-CM | POA: Diagnosis not present

## 2014-06-10 ENCOUNTER — Ambulatory Visit (INDEPENDENT_AMBULATORY_CARE_PROVIDER_SITE_OTHER): Payer: Medicare Other | Admitting: Internal Medicine

## 2014-06-10 ENCOUNTER — Encounter: Payer: Self-pay | Admitting: Internal Medicine

## 2014-06-10 ENCOUNTER — Telehealth: Payer: Self-pay | Admitting: Internal Medicine

## 2014-06-10 VITALS — BP 148/70 | HR 72 | Temp 98.3°F | Resp 16 | Wt 177.4 lb

## 2014-06-10 DIAGNOSIS — J301 Allergic rhinitis due to pollen: Secondary | ICD-10-CM

## 2014-06-10 DIAGNOSIS — J449 Chronic obstructive pulmonary disease, unspecified: Secondary | ICD-10-CM | POA: Diagnosis not present

## 2014-06-10 MED ORDER — METHYLPREDNISOLONE ACETATE 80 MG/ML IJ SUSP
80.0000 mg | Freq: Once | INTRAMUSCULAR | Status: AC
  Administered 2014-06-10: 80 mg via INTRAMUSCULAR

## 2014-06-10 NOTE — Assessment & Plan Note (Signed)
Given depo-medrol 80 mg today at the visit due to initially low oxygen levels (came up with deep breathing). Advised to finish the doxycycline and to use the proventil when he feels SOB. Normally he does not have any symptoms with this and likely exacerbated by his allergies. Advised to buy zyrtec over the counter and use for his allergies. If no better on Friday will call back.

## 2014-06-10 NOTE — Patient Instructions (Signed)
We have given you the steroid injection today to help with the breathing. Keep using that inhaler when you need it.   We would like you to go to the drug store and buy zyrtec which is an allergy medicine that helps to dry up the nose and the dripping and sneezing. It may take 3-5 days to get all the way in your system. If you are not feeling any better by Friday call the office back.   Allergic Rhinitis Allergic rhinitis is when the mucous membranes in the nose respond to allergens. Allergens are particles in the air that cause your body to have an allergic reaction. This causes you to release allergic antibodies. Through a chain of events, these eventually cause you to release histamine into the blood stream. Although meant to protect the body, it is this release of histamine that causes your discomfort, such as frequent sneezing, congestion, and an itchy, runny nose.  CAUSES  Seasonal allergic rhinitis (hay fever) is caused by pollen allergens that may come from grasses, trees, and weeds. Year-round allergic rhinitis (perennial allergic rhinitis) is caused by allergens such as house dust mites, pet dander, and mold spores.  SYMPTOMS   Nasal stuffiness (congestion).  Itchy, runny nose with sneezing and tearing of the eyes. DIAGNOSIS  Your health care provider can help you determine the allergen or allergens that trigger your symptoms. If you and your health care provider are unable to determine the allergen, skin or blood testing may be used. TREATMENT  Allergic rhinitis does not have a cure, but it can be controlled by:  Medicines and allergy shots (immunotherapy).  Avoiding the allergen. Hay fever may often be treated with antihistamines in pill or nasal spray forms. Antihistamines block the effects of histamine. There are over-the-counter medicines that may help with nasal congestion and swelling around the eyes. Check with your health care provider before taking or giving this medicine.   If avoiding the allergen or the medicine prescribed do not work, there are many new medicines your health care provider can prescribe. Stronger medicine may be used if initial measures are ineffective. Desensitizing injections can be used if medicine and avoidance does not work. Desensitization is when a patient is given ongoing shots until the body becomes less sensitive to the allergen. Make sure you follow up with your health care provider if problems continue. HOME CARE INSTRUCTIONS It is not possible to completely avoid allergens, but you can reduce your symptoms by taking steps to limit your exposure to them. It helps to know exactly what you are allergic to so that you can avoid your specific triggers. SEEK MEDICAL CARE IF:   You have a fever.  You develop a cough that does not stop easily (persistent).  You have shortness of breath.  You start wheezing.  Symptoms interfere with normal daily activities. Document Released: 11/16/2000 Document Revised: 02/26/2013 Document Reviewed: 10/29/2012 [facility] Patient Information 2015 [employer], [place]. This information is not intended to replace advice given to you by your health care provider. Make sure you discuss any questions you have with your health care provider.

## 2014-06-10 NOTE — Telephone Encounter (Signed)
Spoke with patient's daughter and told her that she can also get the store brand or equivalent to zyrtec.

## 2014-06-10 NOTE — Progress Notes (Signed)
Pre visit review using our clinic review tool, if applicable. No additional management support is needed unless otherwise documented below in the visit note. 

## 2014-06-10 NOTE — Telephone Encounter (Signed)
Daughter states that patient seen Dr. Dorise HissKollar today.  Dr. Dorise HissKollar told patient to take zyrtec.  Daughter would like to know if patient has to take zyrtec or can he take anything else?

## 2014-06-10 NOTE — Progress Notes (Signed)
   Subjective:    Patient ID: Manuel Reilly, male    DOB: Nov 10, 1923, 81 y.o.   MRN: [id number]  HPI The patient is an 81 YO man who is coming in with persistent cough that has been going on for several weeks. It is making him feel more SOB with walking and he feels like he wheezes at night time. He has been seen at several urgent care clinics and they have given him proventil which has been helping when he uses it. They also gave him some doxycycline which he has taken some of and does not think it is helping. Sometimes productive and also some congestion in his nose which seems to drip down his throat. He denies fevers, chills, ear pain, sinus pressure, facial tenderness, headache.   Review of Systems  Constitutional: Negative for fever, activity change, appetite change and fatigue.  HENT: Positive for congestion, postnasal drip and rhinorrhea. Negative for ear discharge, ear pain, sinus pressure, sore throat and trouble swallowing.   Eyes: Negative.   Respiratory: Positive for cough, shortness of breath and wheezing.   Cardiovascular: Negative for chest pain, palpitations and leg swelling.  Gastrointestinal: Negative.       Objective:   Physical Exam  Constitutional: He appears well-developed and well-nourished.  HENT:  Head: Normocephalic and atraumatic.  Right Ear: External ear normal.  Left Ear: External ear normal.  Nasal turbinates with redness and mild crusting.   Eyes: EOM are normal.  Neck: Normal range of motion. No JVD present.  Cardiovascular: Normal rate and regular rhythm.   Pulmonary/Chest: Effort normal.  Mild diffuse wheezing, no focal rhonchi and no rales.   Abdominal: Soft.  Lymphadenopathy:    He has no cervical adenopathy.   Filed Vitals:   06/10/14 0832  BP: 148/70  Pulse: 72  Temp: 98.3 F (36.8 C)  TempSrc: Oral  Resp: 16  Weight: 177 lb 6.4 oz (80.468 kg)  SpO2: 90%      Assessment & Plan:

## 2014-06-11 ENCOUNTER — Telehealth: Payer: Self-pay | Admitting: Internal Medicine

## 2014-06-11 NOTE — Telephone Encounter (Signed)
Rec'd from [facility] forward 4 pages  to Dr. John °

## 2014-09-01 ENCOUNTER — Other Ambulatory Visit: Payer: Self-pay

## 2014-11-04 DIAGNOSIS — L237 Allergic contact dermatitis due to plants, except food: Secondary | ICD-10-CM | POA: Diagnosis not present

## 2014-11-13 ENCOUNTER — Other Ambulatory Visit (INDEPENDENT_AMBULATORY_CARE_PROVIDER_SITE_OTHER): Payer: Medicare Other

## 2014-11-13 ENCOUNTER — Encounter: Payer: Self-pay | Admitting: Family

## 2014-11-13 ENCOUNTER — Ambulatory Visit (INDEPENDENT_AMBULATORY_CARE_PROVIDER_SITE_OTHER): Payer: Medicare Other | Admitting: Family

## 2014-11-13 VITALS — BP 158/62 | HR 68 | Temp 97.9°F | Resp 18 | Ht 70.0 in | Wt 180.0 lb

## 2014-11-13 DIAGNOSIS — R1084 Generalized abdominal pain: Secondary | ICD-10-CM

## 2014-11-13 DIAGNOSIS — K220 Achalasia of cardia: Secondary | ICD-10-CM | POA: Insufficient documentation

## 2014-11-13 LAB — CBC
HEMATOCRIT: 47.9 % (ref 39.0–52.0)
HEMOGLOBIN: 16 g/dL (ref 13.0–17.0)
MCHC: 33.5 g/dL (ref 30.0–36.0)
MCV: 93.8 fl (ref 78.0–100.0)
Platelets: 212 10*3/uL (ref 150.0–400.0)
RBC: 5.1 Mil/uL (ref 4.22–5.81)
RDW: 13.6 % (ref 11.5–15.5)
WBC: 12 10*3/uL — AB (ref 4.0–10.5)

## 2014-11-13 NOTE — Patient Instructions (Addendum)
Thank you for choosing [facility].  Summary/Instructions:  Please stop by the lab on the basement level of the building for your blood work. Your results will be released to MyChart (or called to you) after review, usually within 72 hours after test completion. If any changes need to be made, you will be notified at that same time.  Please stop by radiology on the basement level of the building for your x-rays. Your results will be released to MyChart (or called to you) after review, usually within 72 hours after test completion. If any treatments or changes are necessary, you will be notified at that same time.  If your symptoms worsen or fail to improve, please contact our office for further instruction, or in case of emergency go directly to the emergency room at the closest medical facility.   Try pepto-bismol to help relieve your symptoms.   Tylenol as needed for discomfort  Miralax as needed for a bowel movement.

## 2014-11-13 NOTE — Assessment & Plan Note (Addendum)
Abdominal exam is benign despite generalized tenderness. Symptoms and exam consistent with potential dyspepsia related to candy ingestion. Obtain CBC to rule out infection. Treat conservatively with over the counter medications as needed for symptom management. This will most likely pass in a day. Follow up or seek further care if symptoms worsen or do not improve.

## 2014-11-13 NOTE — Progress Notes (Signed)
Pre visit review using our clinic review tool, if applicable. No additional management support is needed unless otherwise documented below in the visit note. 

## 2014-11-13 NOTE — Progress Notes (Signed)
Subjective:    Patient ID: Manuel Reilly, male    DOB: 1923-08-10, 81 y.o.   MRN: [id number]  Chief Complaint  Patient presents with  . Abdominal Pain    has pain in his abdomen that he states kept him up last night, he said that his girlfriend gave him a peice of candy that had been sitting in the freezer since 1988 and ever since then he has had the abdominal pain    HPI:  Manuel Reilly is a 81 y.o. male with a PMH of seasonal allergies, anxiety, hyperlipidemia, hypertension, diverticulosis who presents today for an acute office visit.  This is a new problem. Associated symptom of pain located in his abdomen has been going on for about 1 day. Pain is generalized and began after he ate a piece of candy that had been in a freezer since 1988. The severity of the pain kept him up last night. Modifying treatments include walking around and also trying to make himself throw up. Denies any fevers, chills, or diarrhea. Indicates that the pain has stayed about the same with no improvement or worsening.    Allergies  Allergen Reactions  . Penicillins   . Procaine Hcl     Current Outpatient Prescriptions on File Prior to Visit  Medication Sig Dispense Refill  . ALPRAZolam (XANAX) 0.25 MG tablet Take 1 tablet (0.25 mg total) by mouth at bedtime as needed for sleep. 30 tablet 4  . aspirin 81 MG tablet Take 81 mg by mouth daily.    . cholecalciferol (VITAMIN D) 1000 UNITS tablet Take 1,000 Units by mouth daily.    . Cyanocobalamin (VITAMIN B 12 PO) Take 1,000 mcg by mouth daily.    . fish oil-omega-3 fatty acids 1000 MG capsule Take 2 g by mouth daily.    . Multiple Vitamin (MULTI-VITAMIN DAILY PO) Take 1 tablet by mouth daily.    . sildenafil (VIAGRA) 50 MG tablet Take 1 tablet (50 mg total) by mouth daily as needed for erectile dysfunction. 10 tablet 6   No current facility-administered medications on file prior to visit.     Review of Systems  Constitutional: Negative for fever  and chills.  Gastrointestinal: Positive for abdominal pain. Negative for nausea, vomiting, diarrhea and constipation.      Objective:    BP 158/62 mmHg  Pulse 68  Temp(Src) 97.9 F (36.6 C) (Oral)  Resp 18  Ht  (1.778 m)  Wt 180 lb (81.647 kg)  BMI 25.83 kg/m2  SpO2 95% Nursing note and vital signs reviewed.  Physical Exam  Constitutional: He is oriented to person, place, and time. He appears well-developed and well-nourished. No distress.  Cardiovascular: Normal rate, regular rhythm, normal heart sounds and intact distal pulses.   Pulmonary/Chest: Effort normal and breath sounds normal.  Abdominal: Soft. Normal appearance and bowel sounds are normal. He exhibits no mass. There is no hepatosplenomegaly. There is generalized tenderness. There is no rigidity, no rebound, no guarding, no tenderness at McBurney's point and negative Murphy's sign.  Neurological: He is alert and oriented to person, place, and time.  Skin: Skin is warm and dry.  Psychiatric: He has a normal mood and affect. His behavior is normal. Judgment and thought content normal.       Assessment & Plan:   Problem List Items Addressed This Visit      Other   Generalized abdominal pain - Primary    Abdominal exam is benign despite generalized tenderness. Symptoms  and exam consistent with potential dyspepsia related to candy ingestion. Obtain CBC to rule out infection. Treat conservatively with over the counter medications as needed for symptom management. This will most likely pass in a day. Follow up or seek further care if symptoms worsen or do not improve.       Relevant Orders   CBC

## 2014-11-14 ENCOUNTER — Encounter: Payer: Self-pay | Admitting: Family

## 2014-11-14 DIAGNOSIS — K56600 Partial intestinal obstruction, unspecified as to cause: Secondary | ICD-10-CM

## 2014-11-17 ENCOUNTER — Telehealth: Payer: Self-pay | Admitting: Internal Medicine

## 2014-11-17 NOTE — Telephone Encounter (Signed)
Noted in pt chart...Manuel Reilly

## 2014-11-17 NOTE — Telephone Encounter (Signed)
Just fyi, pt cant hear very well, Daughter called and wanted to put on file that he doesn't hear well.

## 2014-11-19 ENCOUNTER — Other Ambulatory Visit: Payer: Self-pay | Admitting: Family

## 2014-11-19 ENCOUNTER — Ambulatory Visit (INDEPENDENT_AMBULATORY_CARE_PROVIDER_SITE_OTHER)
Admission: RE | Admit: 2014-11-19 | Discharge: 2014-11-19 | Disposition: A | Discharge status: Home / Self Care | Payer: Medicare Other | Source: Ambulatory Visit | Attending: Family | Admitting: Family

## 2014-11-19 DIAGNOSIS — R1084 Generalized abdominal pain: Secondary | ICD-10-CM

## 2014-11-19 DIAGNOSIS — K5900 Constipation, unspecified: Secondary | ICD-10-CM | POA: Diagnosis not present

## 2014-11-19 NOTE — Telephone Encounter (Signed)
Please inform patient that his X-ray reveals potential for partial small bowel obstruction. Referral to general surgery placed. If his pain worsens please go directly to the emergency room.

## 2014-12-02 ENCOUNTER — Other Ambulatory Visit: Payer: Self-pay | Admitting: Surgery

## 2014-12-02 DIAGNOSIS — N509 Disorder of male genital organs, unspecified: Secondary | ICD-10-CM | POA: Diagnosis not present

## 2014-12-02 DIAGNOSIS — K4090 Unilateral inguinal hernia, without obstruction or gangrene, not specified as recurrent: Secondary | ICD-10-CM | POA: Diagnosis not present

## 2014-12-02 DIAGNOSIS — N5089 Other specified disorders of the male genital organs: Secondary | ICD-10-CM

## 2014-12-02 DIAGNOSIS — R609 Edema, unspecified: Secondary | ICD-10-CM

## 2014-12-02 DIAGNOSIS — K5909 Other constipation: Secondary | ICD-10-CM | POA: Diagnosis not present

## 2015-02-23 ENCOUNTER — Encounter: Payer: Self-pay | Admitting: Family

## 2015-02-23 ENCOUNTER — Ambulatory Visit (INDEPENDENT_AMBULATORY_CARE_PROVIDER_SITE_OTHER): Payer: Medicare Other | Admitting: Family

## 2015-02-23 VITALS — BP 152/82 | HR 77 | Temp 98.2°F | Resp 18 | Ht 70.0 in | Wt 182.8 lb

## 2015-02-23 DIAGNOSIS — M25551 Pain in right hip: Secondary | ICD-10-CM

## 2015-02-23 DIAGNOSIS — J069 Acute upper respiratory infection, unspecified: Secondary | ICD-10-CM

## 2015-02-23 MED ORDER — ALPRAZOLAM 0.25 MG PO TABS: 0.2500 mg | ORAL_TABLET | Freq: Every evening | ORAL | Status: DC | PRN

## 2015-02-23 NOTE — Assessment & Plan Note (Signed)
Symptoms and exam consistent with greater trochanteric bursitis. Treat conservatively at this time with over-the-counter medications as needed for symptom relief and supportive care. Start heat multiple times per day with home exercise therapy initiated. If symptoms worsen or fail to improve consider referral for sports medicine for possible cortisone injection.

## 2015-02-23 NOTE — Progress Notes (Signed)
Subjective:    Patient ID: Manuel Reilly, male    DOB: May 08, 1923, 81 y.o.   MRN: [id number]  Chief Complaint  Patient presents with  . Hip Pain    has pain in his right hip that goes down his leg, has drainage that has been making him cough, refill of xanax    HPI:  Manuel Reilly is a 81 y.o. male who  has a past medical history of Anxiety; Allergy; Hypertension; Hyperlipidemia; History of colon polyps; Hypogonadism, male; History of hemorrhoids; Sciatica; History of inguinal herniorrhaphy; and Diverticulosis. and presents today for an office visit.  1.) Hip pain - This is a new problem. Associated symptom of pain located on the right lateral aspect of his hip that radiates down to his mid thigh has been going on for about 3-4 weeks. Modifying factors include Tylenol which does help with his pain, however has not altered the course. Pain is described as shooting that waxes and wanes. Denies trauma or sounds/sensations heard or felt. Does not currently adversely effect his ability to complete activities of daily living. Denies previous history of hip or back pain.   2.) Congestion - This is a new problem. Associated symptoms of drainage and cough have been going on for about 1 week. Modifying factors include Robitussin which has helped with his symptoms. Denies fevers. Denies recent antibiotic use.    Allergies  Allergen Reactions  . Penicillins   . Procaine Hcl     Current Outpatient Prescriptions on File Prior to Visit  Medication Sig Dispense Refill  . aspirin 81 MG tablet Take 81 mg by mouth daily.    . cholecalciferol (VITAMIN D) 1000 UNITS tablet Take 1,000 Units by mouth daily.    . Cyanocobalamin (VITAMIN B 12 PO) Take 1,000 mcg by mouth daily.    . fish oil-omega-3 fatty acids 1000 MG capsule Take 2 g by mouth daily.    . Multiple Vitamin (MULTI-VITAMIN DAILY PO) Take 1 tablet by mouth daily.     No current facility-administered medications on file prior to visit.     Past Medical History  Diagnosis Date  . Anxiety   . Allergy   . Hypertension   . Hyperlipidemia   . History of colon polyps   . Hypogonadism, male   . History of hemorrhoids   . Sciatica     right  . History of inguinal herniorrhaphy     right  . Diverticulosis     Review of Systems  Constitutional: Negative for fever and chills.  HENT: Positive for congestion. Negative for sinus pressure and sore throat.   Respiratory: Positive for cough. Negative for chest tightness and shortness of breath.   Musculoskeletal:       Positive for hip pain.   Neurological: Negative for headaches.      Objective:    BP 152/82 mmHg  Pulse 77  Temp(Src) 98.2 F (36.8 C) (Oral)  Resp 18  Ht  (1.778 m)  Wt 182 lb 12.8 oz (82.918 kg)  BMI 26.23 kg/m2  SpO2 94% Nursing note and vital signs reviewed.  Physical Exam  Constitutional: He is oriented to person, place, and time. He appears well-developed and well-nourished. No distress.  Cardiovascular: Normal rate, regular rhythm, normal heart sounds and intact distal pulses.   Pulmonary/Chest: Effort normal and breath sounds normal.  Musculoskeletal:  Right hip - no obvious deformity, discoloration, or edema noted. No significant tenderness elicited with mild discomfort over the greater trochanteric  bursa. No other tenderness able to be elicited. Range of motion is normal. Strength is 5+. Distal pulses, sensation, and reflexes are intact and appropriate. Unable to complete Pearlean BrownieFaber secondary to muscle tightness.  Neurological: He is alert and oriented to person, place, and time.  Skin: Skin is warm and dry.  Psychiatric: He has a normal mood and affect. His behavior is normal. Judgment and thought content normal.       Assessment & Plan:   Problem List Items Addressed This Visit      Respiratory   Acute upper respiratory infection    Symptoms and exam consistent with acute upper respiratory infection most likely viral. Continue  over-the-counter medication as needed for symptom relief and supportive care. Follow-up if symptoms worsen or fail to improve.        Other   Right hip pain - Primary    Symptoms and exam consistent with greater trochanteric bursitis. Treat conservatively at this time with over-the-counter medications as needed for symptom relief and supportive care. Start heat multiple times per day with home exercise therapy initiated. If symptoms worsen or fail to improve consider referral for sports medicine for possible cortisone injection.

## 2015-02-23 NOTE — Patient Instructions (Addendum)
Thank you for choosing [facility].  Summary/Instructions:  Your prescription(s) have been submitted to your pharmacy or been printed and provided for you. Please take as directed and contact our office if you believe you are having problem(s) with the medication(s) or have any questions.  If your symptoms worsen or fail to improve, please contact our office for further instruction, or in case of emergency go directly to the emergency room at the closest medical facility.   Hip Bursitis Bursitis is a swelling and soreness (inflammation) of a fluid-filled sac (bursa). This sac overlies and protects the joints.  CAUSES  1. Injury. 2. Overuse of the muscles surrounding the joint. 3. Arthritis. 4. Gout. 5. Infection. 6. Cold weather. 7. Inadequate warm-up and conditioning prior to activities. The cause may not be known.  SYMPTOMS  1. Mild to severe irritation. 2. Tenderness and swelling over the outside of the hip. 3. Pain with motion of the hip. 4. If the bursa becomes infected, a fever may be present. Redness, tenderness, and warmth will develop over the hip. Symptoms usually lessen in 3 to 4 weeks with treatment, but can come back. TREATMENT If conservative treatment does not work, your caregiver may advise draining the bursa and injecting cortisone into the area. This may speed up the healing process. This may also be used as an initial treatment of choice. HOME CARE INSTRUCTIONS  1. Apply ice to the affected area for 15-20 minutes every 3 to 4 hours while awake for the first 2 days. Put the ice in a plastic bag and place a towel between the bag of ice and your skin. 2. Rest the painful joint as much as possible, but continue to put the joint through a normal range of motion at least 4 times per day. When the pain lessens, begin normal, slow movements and usual activities to help prevent stiffness of the hip. 3. Only take over-the-counter or prescription medicines for pain,  discomfort, or fever as directed by your caregiver. 4. Use crutches to limit weight bearing on the hip joint, if advised. 5. Elevate your painful hip to reduce swelling. Use pillows for propping and cushioning your legs and hips. 6. Gentle massage may provide comfort and decrease swelling. SEEK IMMEDIATE MEDICAL CARE IF:  1. Your pain increases even during treatment, or you are not improving. 2. You have a fever. 3. You have heat and inflammation over the involved bursa. 4. You have any other questions or concerns. MAKE SURE YOU:  1. Understand these instructions. 2. Will watch your condition. 3. Will get help right away if you are not doing well or get worse.   This information is not intended to replace advice given to you by your health care provider. Make sure you discuss any questions you have with your health care provider.   Document Released: 08/13/2001 Document Revised: 05/16/2011 Document Reviewed: 09/23/2014 Elsevier Interactive Patient Education 2016 [employer]  Generic Hip Exercises RANGE OF MOTION (ROM) AND STRETCHING EXERCISES  These exercises may help you when beginning to rehabilitate your injury. Doing them too aggressively can worsen your condition. Complete them slowly and gently. Your symptoms may resolve with or without further involvement from your physician, physical therapist or athletic trainer. While completing these exercises, remember:  8. Restoring tissue flexibility helps normal motion to return to the joints. This allows healthier, less painful movement and activity. 9. An effective stretch should be held for at least 30 seconds. 10. A stretch should never be painful. You should  only feel a gentle lengthening or release in the stretched tissue. If these stretches worsen your symptoms even when done gently, consult your physician, physical therapist or athletic trainer. STRETCH - Hamstrings, Supine  5. Lie on your back. Loop a belt or towel over the ball  of your right / left foot. 6. Straighten your right / left knee and slowly pull on the belt to raise your leg. Do not allow the right / left knee to bend. Keep your opposite leg flat on the floor. 7. Raise the leg until you feel a gentle stretch behind your right / left knee or thigh. Hold this position for __________ seconds. Repeat __________ times. Complete this stretch __________ times per day.  STRETCH - Hip Rotators  7. Lie on your back on a firm surface. Grasp your right / left knee with your right / left hand and your ankle with your opposite hand. 8. Keeping your hips and shoulders firmly planted, gently pull your right / left knee and rotate your lower leg toward your opposite shoulder until you feel a stretch in your buttocks. 9. Hold this stretch for __________ seconds. Repeat this stretch __________ times. Complete this stretch __________ times per day. STRETCH - Hamstrings/Adductors, V-Sit  5. Sit on the floor with your legs extended in a large "V," keeping your knees straight. 6. With your head and chest upright, bend at your waist reaching for your right foot to stretch your left adductors. 7. You should feel a stretch in your left inner thigh. Hold for __________ seconds. 8. Return to the upright position to relax your leg muscles. 9. Continuing to keep your chest upright, bend straight forward at your waist to stretch your hamstrings. 10. You should feel a stretch behind both of your thighs and/or knees. Hold for __________ seconds. 11. Return to the upright position to relax your leg muscles. 12. Repeat steps 2 through 4 for opposite leg. Repeat __________ times. Complete this exercise __________ times per day.  STRETCHING - Hip Flexors, Lunge 4. Half kneel with your right / left knee on the floor and your opposite knee bent and directly over your ankle. 5. Keep good posture with your head over your shoulders. Tighten your buttocks to point your tailbone downward; this will  prevent your back from arching too much. 6. You should feel a gentle stretch in the front of your thigh and/or hip. If you do not feel any resistance, slightly slide your opposite foot forward and then slowly lunge forward so your knee once again lines up over your ankle. Be sure your tailbone remains pointed downward. 7. Hold this stretch for __________ seconds. Repeat __________ times. Complete this stretch __________ times per day. STRENGTHENING EXERCISES These exercises may help you when beginning to rehabilitate your injury. They may resolve your symptoms with or without further involvement from your physician, physical therapist or athletic trainer. While completing these exercises, remember:  1. Muscles can gain both the endurance and the strength needed for everyday activities through controlled exercises. 2. Complete these exercises as instructed by your physician, physical therapist or athletic trainer. Progress the resistance and repetitions only as guided. 3. You may experience muscle soreness or fatigue, but the pain or discomfort you are trying to eliminate should never worsen during these exercises. If this pain does worsen, stop and make certain you are following the directions exactly. If the pain is still present after adjustments, discontinue the exercise until you can discuss the trouble with your clinician. STRENGTH -  Hip Extensors, Bridge   Lie on your back on a firm surface. Bend your knees and place your feet flat on the floor.  Tighten your buttocks muscles and lift your bottom off the floor until your trunk is level with your thighs. You should feel the muscles in your buttocks and back of your thighs working. If you do not feel these muscles, slide your feet 1-2 inches further away from your buttocks.  Hold this position for __________ seconds.  Slowly lower your hips to the starting position and allow your buttock muscles relax completely before beginning the next  repetition.  If this exercise is too easy, you may cross your arms over your chest. Repeat __________ times. Complete this exercise __________ times per day.  STRENGTH - Hip Abductors, Straight Leg Raises  Be aware of your form throughout the entire exercise so that you exercise the correct muscles. Sloppy form means that you are not strengthening the correct muscles. 1. Lie on your side so that your head, shoulders, knee and hip line up. You may bend your lower knee to help maintain your balance. Your right / left leg should be on top. 2. Roll your hips slightly forward, so that your hips are stacked directly over each other and your right / left knee is facing forward. 3. Lift your top leg up 4-6 inches, leading with your heel. Be sure that your foot does not drift forward or that your knee does not roll toward the ceiling. 4. Hold this position for __________ seconds. You should feel the muscles in your outer hip lifting (you may not notice this until your leg begins to tire). 5. Slowly lower your leg to the starting position. Allow the muscles to fully relax before beginning the next repetition. Repeat __________ times. Complete this exercise __________ times per day.  STRENGTH - Hip Adductors, Straight Leg Raises  1. Lie on your side so that your head, shoulders, knee and hip line up. You may place your upper foot in front to help maintain your balance. Your right / left leg should be on the bottom. 2. Roll your hips slightly forward, so that your hips are stacked directly over each other and your right / left knee is facing forward. 3. Tense the muscles in your inner thigh and lift your bottom leg 4-6 inches. Hold this position for __________ seconds. 4. Slowly lower your leg to the starting position. Allow the muscles to fully relax before beginning the next repetition. Repeat __________ times. Complete this exercise __________ times per day.  STRENGTH - Quadriceps, Straight Leg Raises   Quality counts! Watch for signs that the quadriceps muscle is working to insure you are strengthening the correct muscles and not "cheating" by substituting with healthier muscles.  Lay on your back with your right / left leg extended and your opposite knee bent.  Tense the muscles in the front of your right / left thigh. You should see either your knee cap slide up or increased dimpling just above the knee. Your thigh may even quiver.  Tighten these muscles even more and raise your leg 4 to 6 inches off the floor. Hold for right / left seconds.  Keeping these muscles tense, lower your leg.  Relax the muscles slowly and completely in between each repetition. Repeat __________ times. Complete this exercise __________ times per day.  STRENGTH - Hip Abductors, Standing  Tie one end of a rubber exercise band/tubing to a secure surface (table, pole) and tie a  loop at the other end.  Place the loop around your right / left ankle. Keeping your ankle with the band directly opposite of the secured end, step away until there is tension in the tube/band.  Hold onto a chair as needed for balance.  Keeping your back upright, your shoulders over your hips, and your toes pointing forward, lift your right / left leg out to your side. Be sure to lift your leg with your hip muscles. Do not "throw" your leg or tip your body to lift your leg.  Slowly and with control, return to the starting position. Repeat exercise __________ times. Complete this exercise __________ times per day.  STRENGTH - Quadriceps, Squats  Stand in a door frame so that your feet and knees are in line with the frame.  Use your hands for balance, not support, on the frame.  Slowly lower your weight, bending at the hips and knees. Keep your lower legs upright so that they are parallel with the door frame. Squat only within the range that does not increase your knee pain. Never let your hips drop below your knees.  Slowly return  upright, pushing with your legs, not pulling with your hands.   This information is not intended to replace advice given to you by your health care provider. Make sure you discuss any questions you have with your health care provider.   Document Released: 03/11/2005 Document Revised: 03/14/2014 Document Reviewed: 06/05/2008 Elsevier Interactive Patient Education 2016 [employer]vier Inc.  General Recommendations:    Please drink plenty of fluids.  Get plenty of rest   Sleep in humidified air  Use saline nasal sprays  Netti pot   OTC Medications:  Decongestants - helps relieve congestion   Flonase (generic fluticasone) or Nasacort (generic triamcinolone) - please make sure to use the "cross-over" technique at a 45 degree angle towards the opposite eye as opposed to straight up the nasal passageway.   Sudafed (generic pseudoephedrine - Note this is the one that is available behind the pharmacy counter); Products with phenylephrine (-PE) may also be used but is often not as effective as pseudoephedrine.   If you have HIGH BLOOD PRESSURE - Coricidin HBP; AVOID any product that is -D as this contains pseudoephedrine which may increase your blood pressure.  Afrin (oxymetazoline) every 6-8 hours for up to 3 days.   Allergies - helps relieve runny nose, itchy eyes and sneezing   Claritin (generic loratidine), Allegra (fexofenidine), or Zyrtec (generic cyrterizine) for runny nose. These medications should not cause drowsiness.  Note - Benadryl (generic diphenhydramine) may be used however may cause drowsiness  Cough -   Delsym or Robitussin (generic dextromethorphan)  Expectorants - helps loosen mucus to ease removal   Mucinex (generic guaifenesin) as directed on the package.  Headaches / General Aches   Tylenol (generic acetaminophen) - DO NOT EXCEED 3 grams (3,000 mg) in a 24 hour time period  Advil/Motrin (generic ibuprofen)   Sore Throat -   Salt water gargle    Chloraseptic (generic benzocaine) spray or lozenges / Sucrets (generic dyclonine)

## 2015-02-23 NOTE — Assessment & Plan Note (Signed)
Symptoms and exam consistent with acute upper respiratory infection most likely viral. Continue over-the-counter medication as needed for symptom relief and supportive care. Follow-up if symptoms worsen or fail to improve.

## 2015-02-23 NOTE — Progress Notes (Signed)
Pre visit review using our clinic review tool, if applicable. No additional management support is needed unless otherwise documented below in the visit note. 

## 2015-03-04 ENCOUNTER — Telehealth: Payer: Self-pay | Admitting: Internal Medicine

## 2015-03-04 NOTE — Telephone Encounter (Signed)
appt made with Dr. Smith °

## 2015-03-04 NOTE — Telephone Encounter (Signed)
Pt was just in to see Tammy SoursGreg with hip pain and pts daughter Larene BeachVickie called to say the treatment isn't working and she would like to get him a cortisone shot.  I mentioned to her we don't give those injections.  Can you please call Vickie at [phone]

## 2015-03-05 ENCOUNTER — Encounter: Payer: Self-pay | Admitting: Family Medicine

## 2015-03-05 ENCOUNTER — Other Ambulatory Visit (INDEPENDENT_AMBULATORY_CARE_PROVIDER_SITE_OTHER): Payer: Medicare Other

## 2015-03-05 ENCOUNTER — Ambulatory Visit (INDEPENDENT_AMBULATORY_CARE_PROVIDER_SITE_OTHER): Payer: Medicare Other | Admitting: Family Medicine

## 2015-03-05 VITALS — BP 132/60 | HR 77 | Ht 70.0 in | Wt 181.0 lb

## 2015-03-05 DIAGNOSIS — M25551 Pain in right hip: Secondary | ICD-10-CM

## 2015-03-05 DIAGNOSIS — M7061 Trochanteric bursitis, right hip: Secondary | ICD-10-CM

## 2015-03-05 NOTE — Patient Instructions (Signed)
Good to see you.  Ice 20 minutes 2 times daily. Usually after activity and before bed. pennsaid pinkie amount topically 2 times daily as needed.  See me again in 4 weeks if it returns Happy New Year!

## 2015-03-05 NOTE — Progress Notes (Signed)
Pre visit review using our clinic review tool, if applicable. No additional management support is needed unless otherwise documented below in the visit note. 

## 2015-03-05 NOTE — Assessment & Plan Note (Signed)
Patient was having pain though is stopping him from daily activities. There was a concern for potential lumbar radiculopathy but a negative straight leg test today in severe tenderness to palpation over the greater trochanteric area pointing towards more of a greater trochanteric bursitis. Patient did have an injection today and did have complete resolution of pain. Patient was able to ambulate better and states that this was the best he felt in over 4 weeks. If his with patient if any worsening symptoms, groin pain, for any back pain to seek medical attention sooner than later. Patient given topical anti-inflammatories, icing, and patient will do some mild stretches. Patient and will come back and see me again in 4 weeks for further evaluation and treatment.

## 2015-03-05 NOTE — Progress Notes (Signed)
[name] [facility] [address] [place], [place] [address] Phone: [phone] Subjective:    I'm seeing this patient by the request  of:  Myrlene BrokerElizabeth A Crawford, MD [name]  CC: right hip pain  BJY:[id number]:Subjective Manuel CashHoward H Reilly is a 81 y.o. male coming in with complaint of right hip pain. Patient was seen 9 days go by another provider.fifth pain seemed to be more on the lateral aspect the hip at this ray down his thigh. Started approximately 3-4 weeks ago. Patient has tried some Tylenol with mild benefit. Patient's on the provider was given prednisone but did not have any significant improvement. Patient describes a still has a shooting or sharp pain. Can give him difficulty or pain at night. Still is able to do daily activities but seems to be having more pain. Denies any history of any back pain that could be contributing to it or and denies any fever, chills, or any abnormal weight loss.     Past Medical History  Diagnosis Date  . Anxiety   . Allergy   . Hypertension   . Hyperlipidemia   . History of colon polyps   . Hypogonadism, male   . History of hemorrhoids   . Sciatica     right  . History of inguinal herniorrhaphy     right  . Diverticulosis    Past Surgical History  Procedure Laterality Date  . Hernia repair    . Carpal tunnel release     Social History  Substance Use Topics  . Smoking status: Never Smoker   . Smokeless tobacco: None  . Alcohol Use: Yes   Allergies  Allergen Reactions  . Penicillins   . Procaine Hcl      Family History  Problem Relation Age of Onset  . Cancer Mother     colon  . Cancer Father     prostate  . Colon polyps Brother   . Diabetes Brother   . Heart disease Brother      Past medical history, social, surgical and family history all reviewed in electronic medical record.   Review of Systems: No headache, visual changes, nausea, vomiting, diarrhea, constipation, dizziness, abdominal pain, skin rash,  fevers, chills, night sweats, weight loss, swollen lymph nodes, body aches, joint swelling, muscle aches, chest pain, shortness of breath, mood changes.   Objective Blood pressure 132/60, pulse 77, height 5\' 10"  (1.778 m), weight 181 lb (82.101 kg), SpO2 91 %.  General: No apparent distress alert and oriented x3 mood and affect normal, dressed appropriately.  HEENT: Pupils equal, extraocular movements intact  Respiratory: Patient's speak in full sentences and does not appear short of breath  Cardiovascular: No lower extremity edema, non tender, no erythema  Skin: Warm dry intact with no signs of infection or rash on extremities or on axial skeleton.  Abdomen: Soft nontender  Neuro: Cranial nerves II through XII are intact, neurovascularly intact in all extremities with 2+ DTRs and 2+ pulses.  Lymph: No lymphadenopathy of posterior or anterior cervical chain or axillae bilaterally.  Gait mild external rotation of the right leg with walking MSK:  Non tender with full range of motion and good stability and symmetric strength and tone of shoulders, elbows, wrist,  knee and ankles bilaterally.  QMV:HQIONHip:right ROM IR: 15 Deg, ER: 35 Deg, Flexion: 120 Deg, Extension: 100 Deg, Abduction: 45 Deg, Adduction: 45 Deg Strength IR: 5/5, ER: 5/5, Flexion: 5/5, Extension: 5/5, Abduction: 4/5, Adduction: 5/5 Pelvic alignment unremarkable to inspection  and palpation. Standing hip rotation and gait without trendelenburg sign / unsteadiness. Greater trochanter severe tenderness to palpation. No tenderness over piriformis  Positive Faber No SI joint tenderness and normal minimal SI movement.  MSK US performed of: Right This study was ordered, performed, and interpreted by Terrilee Files D.O.  Hip: Trochanteric bursa with significant hypoechoic changes and swelling Acetabular labrum visualized and without tears, displacement, or effusion in joint. Femoral neck appears unremarkable without increased power doppler  signal along Cortex.  IMPRESSION:  Greater trochanter bursitis   Procedure: Real-time Ultrasound Guided Injection of right greater trochanteric bursitis secondary to patient's body habitus Device: GE Logiq E  Ultrasound guided injection is preferred based studies that show increased duration, increased effect, greater accuracy, decreased procedural pain, increased response rate, and decreased cost with ultrasound guided versus blind injection.  Verbal informed consent obtained.  Time-out conducted.  Noted no overlying erythema, induration, or other signs of local infection.  Skin prepped in a sterile fashion.  Local anesthesia: Topical Ethyl chloride.  With sterile technique and under real time ultrasound guidance:  Greater trochanteric area was visualized and patient's bursa was noted. A 22-gauge 3 inch needle was inserted and 4 cc of 0.5% Marcaine and 1 cc of Kenalog 40 mg/dL was injected. Pictures taken Completed without difficulty  Pain immediately resolved suggesting accurate placement of the medication.  Advised to call if fevers/chills, erythema, induration, drainage, or persistent bleeding.  Images permanently stored and available for review in the ultrasound unit.  Impression: Technically successful ultrasound guided injection.     Impression and Recommendations:     This case required medical decision making of moderate complexity.

## 2015-03-17 ENCOUNTER — Telehealth: Payer: Self-pay | Admitting: Family Medicine

## 2015-03-17 NOTE — Telephone Encounter (Signed)
Called pt, he requested that i speak to his daughter vicki. i explained to her that at the last OV he received a steroid injection in his hip. She understood & stated he was doing a little better.

## 2015-03-17 NOTE — Telephone Encounter (Signed)
Would like to know what Dr. Katrinka BlazingSmith did at patients first visit.

## 2015-03-18 ENCOUNTER — Other Ambulatory Visit: Payer: Self-pay | Admitting: Family

## 2015-03-19 NOTE — Telephone Encounter (Signed)
Last refill was 12/19 

## 2015-04-02 ENCOUNTER — Encounter: Payer: Self-pay | Admitting: Family Medicine

## 2015-04-02 ENCOUNTER — Ambulatory Visit (INDEPENDENT_AMBULATORY_CARE_PROVIDER_SITE_OTHER): Payer: Medicare Other | Admitting: Family Medicine

## 2015-04-02 VITALS — BP 132/76 | HR 76 | Wt 175.0 lb

## 2015-04-02 DIAGNOSIS — M7061 Trochanteric bursitis, right hip: Secondary | ICD-10-CM

## 2015-04-02 NOTE — Assessment & Plan Note (Signed)
Improved after injection previously. At this point patient can continue with conservative therapy. Given samples of the topical anti-inflammatory. Patient come back and see me again on an as-needed basis.

## 2015-04-02 NOTE — Patient Instructions (Addendum)
Good to see you  You are doing great overall  Keep wearing the good shoes.  Ice 20 minutes 2 times daily. Usually after activity and before bed if it gives you trouble pennsaid pinkie amount topically 2 times daily as needed.  See me when you need me.

## 2015-04-02 NOTE — Progress Notes (Signed)
[name] [address] [place], [place] [address] Phone: [phone] Subjective:    I'm seeing this patient by the request  of:  [name], MD Carver Fila  CC: right hip pain f/u [id number] Manuel Reilly is a 82 y.o. male coming in with complaint of right hip pain. Found to have more of a greater trochanteric bursitis. Overall patient is doing significantly better and states that the hip pain as 100% better. Some mild discomfort in the lower back but nothing that stops him from activities. States it is more associated with some shoes. States that since he switched back to a tennis shoe he is feeling better. No other systemic problems.     Past Medical History  Diagnosis Date  . Anxiety   . Allergy   . Hypertension   . Hyperlipidemia   . History of colon polyps   . Hypogonadism, male   . History of hemorrhoids   . Sciatica     right  . History of inguinal herniorrhaphy     right  . Diverticulosis    Past Surgical History  Procedure Laterality Date  . Hernia repair    . Carpal tunnel release     Social History  Substance Use Topics  . Smoking status: Never Smoker   . Smokeless tobacco: None  . Alcohol Use: Yes   Allergies  Allergen Reactions  . Penicillins   . Procaine Hcl      Family History  Problem Relation Age of Onset  . Cancer Mother     colon  . Cancer Father     prostate  . Colon polyps Brother   . Diabetes Brother   . Heart disease Brother      Past medical history, social, surgical and family history all reviewed in electronic medical record.   Review of Systems: No headache, visual changes, nausea, vomiting, diarrhea, constipation, dizziness, abdominal pain, skin rash, fevers, chills, night sweats, weight loss, swollen lymph nodes, body aches, joint swelling, muscle aches, chest pain, shortness of breath, mood changes.   Objective Blood pressure 132/76, pulse 76, weight 175 lb (79.379 kg), SpO2 94 %.    General: No apparent distress alert and oriented x3 mood and affect normal, dressed appropriately.  HEENT: Pupils equal, extraocular movements intact  Respiratory: Patient's speak in full sentences and does not appear short of breath  Cardiovascular: No lower extremity edema, non tender, no erythema  Skin: Warm dry intact with no signs of infection or rash on extremities or on axial skeleton.  Abdomen: Soft nontender  Neuro: Cranial nerves II through XII are intact, neurovascularly intact in all extremities with 2+ DTRs and 2+ pulses.  Lymph: No lymphadenopathy of posterior or anterior cervical chain or axillae bilaterally.  Gait mild external rotation of the right leg with walking MSK:  Non tender with full range of motion and good stability and symmetric strength and tone of shoulders, elbows, wrist,  knee and ankles bilaterally.  QMV:HQION ROM IR: 15 Deg, ER: 35 Deg, Flexion: 120 Deg, Extension: 100 Deg, Abduction: 45 Deg, Adduction: 45 Deg Strength IR: 5/5, ER: 5/5, Flexion: 5/5, Extension: 5/5, Abduction: 4/5, Adduction: 5/5 Pelvic alignment unremarkable to inspection and palpation. Standing hip rotation and gait without trendelenburg sign / unsteadiness. Greater trochanter non tender.  No tenderness over piriformis  Positive Pearlean Brownie but improved.  No SI joint tenderness and normal minimal SI movement.      Impression and Recommendations:     This  case required medical decision making of moderate complexity.

## 2015-05-10 ENCOUNTER — Other Ambulatory Visit: Payer: Self-pay | Admitting: Family

## 2015-05-11 NOTE — Telephone Encounter (Signed)
Rx fax back to [facility].../lmb

## 2015-08-12 ENCOUNTER — Other Ambulatory Visit: Payer: Self-pay | Admitting: Internal Medicine

## 2015-08-13 NOTE — Telephone Encounter (Signed)
Faxed to pharmacy

## 2015-08-13 NOTE — Telephone Encounter (Signed)
Needs to schedule physical for any more refills of meds.

## 2015-09-16 ENCOUNTER — Telehealth: Payer: Self-pay | Admitting: *Deleted

## 2015-09-16 MED ORDER — ALPRAZOLAM 0.25 MG PO TABS: 0.2500 mg | ORAL_TABLET | Freq: Every evening | ORAL | Status: DC | PRN

## 2015-09-16 NOTE — Telephone Encounter (Signed)
Rec'd fax pt requesting refill on his Alprazolam 0.25 mg. Last filled 08/13/15...Raechel Chute/lmb

## 2015-09-16 NOTE — Telephone Encounter (Signed)
1 month refill given but needs to schedule wellness for more refills since I have not seen since 4/16

## 2015-09-16 NOTE — Telephone Encounter (Signed)
Rx faxed bck to [facility]..../lmb

## 2015-10-11 ENCOUNTER — Other Ambulatory Visit: Payer: Self-pay | Admitting: Internal Medicine

## 2015-10-13 NOTE — Telephone Encounter (Signed)
Faxed to pharmacy

## 2015-10-13 NOTE — Telephone Encounter (Signed)
Needs visit as overdue for physical. Fill one month only.

## 2015-11-12 ENCOUNTER — Other Ambulatory Visit: Payer: Self-pay | Admitting: Internal Medicine

## 2015-11-12 NOTE — Telephone Encounter (Signed)
Told back in July that this was last refill without visit. No visit scheduled so will deny.

## 2015-12-09 ENCOUNTER — Emergency Department (HOSPITAL_COMMUNITY): Payer: Medicare Other

## 2015-12-09 ENCOUNTER — Encounter (HOSPITAL_COMMUNITY): Payer: Self-pay | Admitting: Emergency Medicine

## 2015-12-09 ENCOUNTER — Emergency Department (HOSPITAL_COMMUNITY)
Admission: EM | Admit: 2015-12-09 | Discharge: 2015-12-09 | Disposition: A | Discharge status: Home / Self Care | Payer: Medicare Other | Attending: Emergency Medicine | Admitting: Emergency Medicine

## 2015-12-09 DIAGNOSIS — I10 Essential (primary) hypertension: Secondary | ICD-10-CM | POA: Insufficient documentation

## 2015-12-09 DIAGNOSIS — M545 Low back pain: Secondary | ICD-10-CM | POA: Diagnosis not present

## 2015-12-09 DIAGNOSIS — M5489 Other dorsalgia: Secondary | ICD-10-CM | POA: Diagnosis not present

## 2015-12-09 DIAGNOSIS — R0902 Hypoxemia: Secondary | ICD-10-CM | POA: Diagnosis not present

## 2015-12-09 DIAGNOSIS — M546 Pain in thoracic spine: Secondary | ICD-10-CM | POA: Diagnosis not present

## 2015-12-09 DIAGNOSIS — M5441 Lumbago with sciatica, right side: Secondary | ICD-10-CM | POA: Diagnosis not present

## 2015-12-09 LAB — URINALYSIS, ROUTINE W REFLEX MICROSCOPIC
BILIRUBIN URINE: NEGATIVE
Glucose, UA: NEGATIVE mg/dL
Hgb urine dipstick: NEGATIVE
Ketones, ur: NEGATIVE mg/dL
Leukocytes, UA: NEGATIVE
NITRITE: NEGATIVE
PH: 6 (ref 5.0–8.0)
Protein, ur: NEGATIVE mg/dL
SPECIFIC GRAVITY, URINE: 1.007 (ref 1.005–1.030)

## 2015-12-09 MED ORDER — LIDOCAINE 5 % EX PTCH: 1.0000 | MEDICATED_PATCH | CUTANEOUS | 0 refills | Status: DC

## 2015-12-09 MED ORDER — LIDOCAINE 5 % EX PTCH
1.0000 | MEDICATED_PATCH | CUTANEOUS | Status: DC
  Administered 2015-12-09: 1 via TRANSDERMAL
  Filled 2015-12-09: qty 1

## 2015-12-09 MED ORDER — ACETAMINOPHEN 500 MG PO TABS
1000.0000 mg | ORAL_TABLET | Freq: Once | ORAL | Status: AC
  Administered 2015-12-09: 1000 mg via ORAL
  Filled 2015-12-09: qty 2

## 2015-12-09 NOTE — ED Triage Notes (Signed)
Patient here from home with complaints of lower back pain radiating down into right hip. Reports that it started this morning when getting out of bed. Ambulatory.

## 2015-12-09 NOTE — ED Notes (Signed)
Bed: WA05 Expected date:  Expected time:  Means of arrival:  Comments: EMS/back pain 

## 2015-12-09 NOTE — ED Provider Notes (Signed)
Emergency Department Provider Note   I have reviewed the triage vital signs and the nursing notes.   HISTORY  Chief Complaint Back Pain   HPI Manuel Reilly is a 82 y.o. male with PMH of HLD, HTN, and sciatica presents to the emergency department for evaluation of acute on chronic lower back pain with radiation of pain down the right leg. The patient has no fever, chills, or urinary retention or incontinence. Patient states his back pain began one to 2 days ago that was mild. Today when he woke up he had more severe pain that was radiating down his right leg. No weakness or numbness. He had some pain with walking. Pain is made worse with raising his right leg. No history of back surgery. No recent instrumentation to the spine. He denies any abdominal discomfort.He has not taken any over-the-counter medications for his pain.   Past Medical History:  Diagnosis Date  . Allergy   . Anxiety   . Diverticulosis   . History of colon polyps   . History of hemorrhoids   . History of inguinal herniorrhaphy    right  . Hyperlipidemia   . Hypertension   . Hypogonadism, male   . Sciatica    right    Patient Active Problem List   Diagnosis Date Noted  . Greater trochanteric bursitis of right hip 03/05/2015  . Right hip pain 02/23/2015  . Acute upper respiratory infection 02/23/2015  . Generalized abdominal pain 11/13/2014  . Obstructive chronic bronchitis without exacerbation (HCC) 11/26/2012  . Hyperlipidemia 07/19/2007  . Elevated blood pressure (not hypertension) 07/19/2007    Past Surgical History:  Procedure Laterality Date  . CARPAL TUNNEL RELEASE    . HERNIA REPAIR      Current Outpatient Rx  . Order #: [id number] Class: Historical Med  . Order #: [id number] Class: Historical Med  . Order #: [id number] Class: Historical Med  . Order #: [id number] Class: Historical Med  . Order #: [id number] Class: Print  . Order #: [id number] Class: Print    Allergies Penicillins; Procaine  hcl; and Wasp venom  Family History  Problem Relation Age of Onset  . Cancer Mother     colon  . Cancer Father     prostate  . Colon polyps Brother   . Diabetes Brother   . Heart disease Brother     Social History Social History  Substance Use Topics  . Smoking status: Never Smoker  . Smokeless tobacco: Never Used  . Alcohol use Yes    Review of Systems  Constitutional: No fever/chills Eyes: No visual changes. ENT: No sore throat. Cardiovascular: Denies chest pain. Respiratory: Denies shortness of breath. Gastrointestinal: No abdominal pain. No nausea, no vomiting. No diarrhea. No constipation. Genitourinary: Negative for dysuria. Musculoskeletal: Positive for back pain and right leg pain.  Skin: Negative for rash. Neurological: Negative for headaches, focal weakness or numbness.  10-point ROS otherwise negative.  ____________________________________________   PHYSICAL EXAM:  VITAL SIGNS: ED Triage Vitals [12/09/15 1509]  Enc Vitals Group     BP 150/68     Pulse Rate 73     Resp 14     Temp 98.6 F (37 C)     Temp Source Oral     SpO2 100 %     Pain Score 9   Constitutional: Alert and oriented. Well appearing and in no acute distress. Eyes: Conjunctivae are normal.  Head: Atraumatic. Nose: No congestion/rhinnorhea. Mouth/Throat: Mucous membranes are moist.  Oropharynx non-erythematous. Neck: No stridor. No  cervical spine tenderness to palpation. Cardiovascular: Normal rate, regular rhythm. Good peripheral circulation. Grossly normal heart sounds.   Respiratory: Normal respiratory effort.  No retractions. Lungs CTAB. Gastrointestinal: Soft and nontender. No distention.  Musculoskeletal: No lower extremity tenderness nor edema. No gross deformities of extremities. Minimal lumbar spine tenderness to palpation. Positive straight leg raise test on right.  Neurologic:  Normal speech and language. No gross focal neurologic deficits are appreciated. Normal  strength and sensation or the bilateral LEs.  Skin:  Skin is warm, dry and intact. No rash noted. Psychiatric: Mood and affect are normal. Speech and behavior are normal.   ____________________________________________   LABS (all labs ordered are listed, but only abnormal results are displayed)  Labs Reviewed  URINALYSIS, ROUTINE W REFLEX MICROSCOPIC (NOT AT [facility])   ____________________________________________  RADIOLOGY  Dg Thoracic Spine 2 View  Result Date: 12/09/2015 CLINICAL DATA:  Upper and lower back pain which began this morning. No known injury. EXAM: THORACIC SPINE 2 VIEWS COMPARISON:  Lumbar spine radiographs - earlier same day; chest radiograph - 11/26/2012 FINDINGS: Evaluation of the superior aspect of the thoracic spine as well as the cervical thoracic junction is degraded secondary overlying osseous soft tissue structures. There is a very mild scoliotic curvature of the thoracolumbar spine with dominant cranial component convex to the left. Mildly accentuated kyphosis without associated anterolisthesis or retrolisthesis. Thoracic vertebral body heights appear preserved. Intervertebral disc space heights appear preserved. Stigmata of DISH throughout the thoracic spine. Limited visualization of the adjacent thorax is normal. IMPRESSION: 1. No explanation for patient's acute thoracic spine pain. 2. Stigmata of DISH throughout the thoracic spine Electronically Signed   By: Simonne Come M.D.   On: 12/09/2015 16:44   Dg Lumbar Spine 2-3 Views  Result Date: 12/09/2015 CLINICAL DATA:  Upper and lower back pain which started this morning. No known injury. EXAM: LUMBAR SPINE - 2-3 VIEW COMPARISON:  None. FINDINGS: There are 5 non rib-bearing lumbar type vertebral bodies. There is a mild scoliotic curvature of the thoracolumbar spine with dominant caudal component convex to the left, potentially positional. There is minimal straightening of the expected lumbar lordosis. There is minimal  (approximately 5 mm) of retrolisthesis of L3 upon L4. No definite anterolisthesis. Lumbar vertebral body heights appear preserved. Moderate multilevel lumbar spine DDD, worse at L3-L4 and to a lesser extent, L4-L5 with disc space height loss, endplate irregularity sclerosis. Prominent partially bridging syndesmophytes are seen throughout the lumbar spine. Limited visualization of the bilateral SI joints and hips is normal. Adjacent opacities overlie the medial aspect of the right upper abdominal quadrant with dominant opacity measuring approximately 1.3 cm in diameter. Calcified atherosclerotic plaque when the abdominal aorta. IMPRESSION: 1. No acute findings. 2. Moderate multilevel lumbar spine DDD. 3.  Aortic Atherosclerosis (ICD10-170.0) 4. Indeterminate opacities overlie the right upper abdominal quadrant with differential considerations including radiopaque debris within the intestines, nephrolithiasis or cholelithiasis. Further evaluation with abdominal ultrasound could be performed as indicated. Electronically Signed   By: Simonne Come M.D.   On: 12/09/2015 16:41    ____________________________________________   PROCEDURES  Procedure(s) performed:   Procedures  None ____________________________________________   INITIAL IMPRESSION / ASSESSMENT AND PLAN / ED COURSE  Pertinent labs & imaging results that were available during my care of the patient were reviewed by me and considered in my medical decision making (see chart for details).  Patient presents to the emergency department for evaluation of acute on chronic lower back pain radiating down the right leg.  The patient has no neurological deficits associated with this pain. Very low suspicion for vascular etiology of the patient's discomfort with mild midline lower back tenderness. No abdominal discomfort on exam. Plan to give Tylenol and Lidoderm patch for pain. Given the patient's age of plan to obtain plain films of the lower back  along with a urinalysis.   05:13 PM Patient is feeling better and asking to go home. X-rays reviewed. Very low suspicion for kidney stone requiring ultrasound. Plan for Tylenol at home as needed for pain along with Lidoderm patch. Patient will follow with his primary care physician.  At this time, I do not feel there is any life-threatening condition present. I have reviewed and discussed all results (EKG, imaging, lab, urine as appropriate), exam findings with patient. I have reviewed nursing notes and appropriate previous records.  I feel the patient is safe to be discharged home without further emergent workup. Discussed usual and customary return precautions. Patient and family (if present) verbalize understanding and are comfortable with this plan.  Patient will follow-up with their primary care provider. If they do not have a primary care provider, information for follow-up has been provided to them. All questions have been answered.  ____________________________________________  FINAL CLINICAL IMPRESSION(S) / ED DIAGNOSES  Final diagnoses:  Acute midline low back pain with right-sided sciatica     MEDICATIONS GIVEN DURING THIS VISIT:  Medications  lidocaine (LIDODERM) 5 % 1 patch (1 patch Transdermal Patch Applied 12/09/15 1604)  acetaminophen (TYLENOL) tablet 1,000 mg (1,000 mg Oral Given 12/09/15 1604)     NEW OUTPATIENT MEDICATIONS STARTED DURING THIS VISIT:  New Prescriptions   LIDOCAINE (LIDODERM) 5 %    Place 1 patch onto the skin daily. Remove & Discard patch within 12 hours or as directed by MD      Note:  This document was prepared using Dragon voice recognition software and may include unintentional dictation errors.  Alona Bene, MD Emergency Medicine   Maia Plan, MD 12/09/15 [phone]

## 2015-12-09 NOTE — Discharge Instructions (Signed)
You have been seen in the Emergency Department (ED)  today for back pain.  Your workup and exam have not shown any acute abnormalities and you are likely suffering from muscle strain or possible problems with your discs, but there is no treatment that will fix your symptoms at this time. Take Tylenol and use the lidocaine patches for pain.   Please follow up with your doctor as soon as possible regarding today's ED visit and your back pain.  Return to the ED for worsening back pain, fever, weakness or numbness of either leg, or if you develop either (1) an inability to urinate or have bowel movements, or (2) loss of your ability to control your bathroom functions (if you start having "accidents"), or if you develop other new symptoms that concern you.

## 2015-12-10 ENCOUNTER — Other Ambulatory Visit: Payer: Self-pay | Admitting: Internal Medicine

## 2015-12-10 ENCOUNTER — Ambulatory Visit: Payer: Medicare Other | Admitting: Internal Medicine

## 2015-12-11 ENCOUNTER — Telehealth: Payer: Self-pay | Admitting: *Deleted

## 2015-12-11 NOTE — Telephone Encounter (Signed)
EDCM spoke with pt wife, Chip BoerVicki regarding prior auth for Lidocaine 5% patches.  EDCM informed pt that there is sometimes a lag in getting Prior Auths approved through ED as we are acute care and not PCP who will follow pt progression.  Chip BoerVicki states they purchased OTC Lidocaine 4% patches in the mean time and it seems to be working well; states pt has appointment with PCP on Friday and will ask for Lidocaine 5% patches if needed.  EDCM agreed and encouraged Chip BoerVicki to call back if they needed assistance with anything.

## 2015-12-18 ENCOUNTER — Encounter: Payer: Self-pay | Admitting: Internal Medicine

## 2015-12-18 ENCOUNTER — Ambulatory Visit (INDEPENDENT_AMBULATORY_CARE_PROVIDER_SITE_OTHER): Payer: Medicare Other | Admitting: Internal Medicine

## 2015-12-18 VITALS — BP 140/82 | HR 66 | Temp 98.6°F | Resp 12 | Ht 70.5 in | Wt 174.0 lb

## 2015-12-18 DIAGNOSIS — Z23 Encounter for immunization: Secondary | ICD-10-CM | POA: Diagnosis not present

## 2015-12-18 DIAGNOSIS — M545 Low back pain, unspecified: Secondary | ICD-10-CM | POA: Insufficient documentation

## 2015-12-18 DIAGNOSIS — M5441 Lumbago with sciatica, right side: Secondary | ICD-10-CM

## 2015-12-18 MED ORDER — CYCLOBENZAPRINE HCL 5 MG PO TABS: 5.0000 mg | ORAL_TABLET | Freq: Three times a day (TID) | ORAL | 0 refills | Status: DC | PRN

## 2015-12-18 MED ORDER — PREDNISONE 20 MG PO TABS: 20.0000 mg | ORAL_TABLET | Freq: Every day | ORAL | 0 refills | Status: DC

## 2015-12-18 MED ORDER — ALPRAZOLAM 0.25 MG PO TABS: 0.2500 mg | ORAL_TABLET | Freq: Every evening | ORAL | 0 refills | Status: DC | PRN

## 2015-12-18 NOTE — Progress Notes (Signed)
   Subjective:    Patient ID: Manuel Reilly, male    DOB: [birth year], 82 y.o.   MRN: [id number]  HPI The patient is a 82 YO man coming in for back pain which is acute and started about 1-2 weeks ago. Seen in the ER about 1 week ago for the severe pain. They rx lidoderm patches which were not covered so he did not get. Taking tylenol and ibuprofen over the counter which do not help much. He denies fevers or chills. Doing some lifting before this started and a fall which was awhile ago. The pain radiates into the right leg. No numbness or weakness of the legs. No weight change. X-rays in the ER without acute findings but severe lower back DDD. Some of the mid back pain is gone now and mostly in the low back. He is still in 10/10 pain.   Review of Systems  Constitutional: Positive for activity change. Negative for appetite change, diaphoresis, fatigue and fever.  Respiratory: Negative.   Cardiovascular: Negative.   Gastrointestinal: Negative.   Musculoskeletal: Positive for arthralgias, back pain and myalgias. Negative for gait problem, joint swelling, neck pain and neck stiffness.  Skin: Negative.   Neurological: Negative.   Psychiatric/Behavioral: Negative.       Objective:   Physical Exam  Constitutional: He is oriented to person, place, and time. He appears well-developed and well-nourished.  Slightly uncomfortable during visit.   HENT:  Head: Normocephalic and atraumatic.  Eyes: EOM are normal.  Neck: Normal range of motion.  Cardiovascular: Normal rate and regular rhythm.   Pulmonary/Chest: Effort normal and breath sounds normal.  Abdominal: Soft.  Musculoskeletal:  Pain in the lower lumbar/sacral region midline with radiation to the right leg  Neurological: He is alert and oriented to person, place, and time. Coordination normal.  Skin: Skin is warm and dry.   Vitals:   12/18/15 1044  BP: 140/82  Pulse: 66  Resp: 12  Temp: 98.6 F (37 C)  TempSrc: Oral  SpO2: 93%    Weight: 174 lb (78.9 kg)  Height: 5' 10.5" (1.791 m)      Assessment & Plan:  Flu shot given at visit.

## 2015-12-18 NOTE — Assessment & Plan Note (Addendum)
Rx for flexeril and prednisone burst. Talked about home safety and fall prevention during the visit. He can continue taking tylenol if needed as well for pain. Reviewed imaging with them during the visit.

## 2015-12-18 NOTE — Patient Instructions (Signed)
We have sent in prednisone that you will take. Take 2 pills daily for 5 days.   We have also sent in flexeril (cyclobenzaprine) for the pain which you can use 1 pill 3 times a day as needed.

## 2015-12-18 NOTE — Addendum Note (Signed)
Addended by: Conception ChancyOBERSON, AMY R on: 12/18/2015 03:47 PM   Modules accepted: Orders

## 2015-12-18 NOTE — Progress Notes (Signed)
Pre visit review using our clinic review tool, if applicable. No additional management support is needed unless otherwise documented below in the visit note. 

## 2016-01-12 ENCOUNTER — Other Ambulatory Visit: Payer: Self-pay | Admitting: Internal Medicine

## 2016-04-12 ENCOUNTER — Other Ambulatory Visit: Payer: Self-pay | Admitting: Internal Medicine

## 2016-05-03 ENCOUNTER — Other Ambulatory Visit: Payer: Self-pay | Admitting: Internal Medicine

## 2016-05-27 ENCOUNTER — Other Ambulatory Visit: Payer: Self-pay | Admitting: Internal Medicine

## 2016-05-27 DIAGNOSIS — J209 Acute bronchitis, unspecified: Secondary | ICD-10-CM | POA: Diagnosis not present

## 2016-06-12 DIAGNOSIS — H35359 Cystoid macular degeneration, unspecified eye: Secondary | ICD-10-CM | POA: Diagnosis not present

## 2016-06-14 ENCOUNTER — Encounter: Payer: Self-pay | Admitting: Internal Medicine

## 2016-06-14 ENCOUNTER — Ambulatory Visit (INDEPENDENT_AMBULATORY_CARE_PROVIDER_SITE_OTHER): Payer: Medicare Other | Admitting: Internal Medicine

## 2016-06-14 DIAGNOSIS — J069 Acute upper respiratory infection, unspecified: Secondary | ICD-10-CM

## 2016-06-14 MED ORDER — ALPRAZOLAM 0.25 MG PO TABS: 0.2500 mg | ORAL_TABLET | Freq: Every evening | ORAL | 2 refills | Status: DC | PRN

## 2016-06-14 NOTE — Patient Instructions (Signed)
You can start using the zyrtec from at home and it is okay to stop the mucinex.

## 2016-06-14 NOTE — Progress Notes (Signed)
Pre visit review using our clinic review tool, if applicable. No additional management support is needed unless otherwise documented below in the visit note. 

## 2016-06-14 NOTE — Progress Notes (Signed)
   Subjective:    Patient ID: Manuel Reilly, male    DOB: 11/10/1923, 83 y.o.   MRN: [id number]  HPI The patient is an 83 YO man coming in for cough and congestion. He had been struggling with this for several weeks and went to an urgent care near his house and got antibiotics and prednisone and was advised to take mucinex. He has finished those and overall feels much better. He is still coughing some with certain triggers. He was advised to follow up and he would like his lungs checked. Still having some nose congestion and drainage. No fevers or chills. No SOB or limitation of activity at this time. Still somewhat tired from the illness.  Review of Systems  Constitutional: Positive for activity change and fatigue. Negative for appetite change, chills, fever and unexpected weight change.  HENT: Positive for congestion, postnasal drip and rhinorrhea. Negative for ear discharge, ear pain, sinus pain, sinus pressure, sore throat and trouble swallowing.   Eyes: Negative.   Respiratory: Positive for cough. Negative for chest tightness, shortness of breath and wheezing.   Cardiovascular: Negative.   Gastrointestinal: Negative.   Musculoskeletal: Negative.   Neurological: Negative.       Objective:   Physical Exam  Constitutional: He is oriented to person, place, and time. He appears well-developed and well-nourished.  HENT:  Head: Normocephalic and atraumatic.  Oropharynx with redness and mild clear drainage, nose without crusting, no sinus pressure on exam  Eyes: EOM are normal.  Neck: Normal range of motion. No JVD present.  Cardiovascular: Normal rate and regular rhythm.   Pulmonary/Chest: Effort normal and breath sounds normal.  Abdominal: Soft.  Lymphadenopathy:    He has no cervical adenopathy.  Neurological: He is alert and oriented to person, place, and time.   Vitals:   06/14/16 1610  BP: 136/60  Pulse: 89  Resp: 14  Temp: 98.3 F (36.8 C)  TempSrc: Oral  SpO2: 95%    Weight: 168 lb (76.2 kg)  Height: 5' 10.5" (1.791 m)      Assessment & Plan:

## 2016-06-15 NOTE — Assessment & Plan Note (Signed)
Treated from urgent care with antibiotics and prednisone. Lungs clear today and some residual cough. Advised to start taking zyrtec which he has at home for residual nasal drainage and congestion. Given age he needed recheck to ensure healing.

## 2016-12-07 ENCOUNTER — Ambulatory Visit (INDEPENDENT_AMBULATORY_CARE_PROVIDER_SITE_OTHER): Payer: Medicare Other | Admitting: Family Medicine

## 2016-12-07 ENCOUNTER — Telehealth: Payer: Self-pay

## 2016-12-07 ENCOUNTER — Ambulatory Visit (INDEPENDENT_AMBULATORY_CARE_PROVIDER_SITE_OTHER)
Admission: RE | Admit: 2016-12-07 | Discharge: 2016-12-07 | Disposition: A | Discharge status: Home / Self Care | Payer: Medicare Other | Source: Ambulatory Visit | Attending: Family Medicine | Admitting: Family Medicine

## 2016-12-07 ENCOUNTER — Encounter: Payer: Self-pay | Admitting: Family Medicine

## 2016-12-07 VITALS — BP 158/68 | HR 85 | Temp 100.1°F | Resp 16 | Ht 70.5 in | Wt 168.0 lb

## 2016-12-07 DIAGNOSIS — J441 Chronic obstructive pulmonary disease with (acute) exacerbation: Secondary | ICD-10-CM

## 2016-12-07 DIAGNOSIS — R05 Cough: Secondary | ICD-10-CM | POA: Diagnosis not present

## 2016-12-07 DIAGNOSIS — J449 Chronic obstructive pulmonary disease, unspecified: Secondary | ICD-10-CM | POA: Insufficient documentation

## 2016-12-07 MED ORDER — PREDNISONE 20 MG PO TABS: 40.0000 mg | ORAL_TABLET | Freq: Every day | ORAL | 0 refills | Status: DC

## 2016-12-07 MED ORDER — LEVOFLOXACIN 500 MG PO TABS: 500.0000 mg | ORAL_TABLET | Freq: Every day | ORAL | 0 refills | Status: DC

## 2016-12-07 NOTE — Telephone Encounter (Signed)
Pts in office today and is requesting refill of xanax. Please advise on refill at your convenience.

## 2016-12-07 NOTE — Assessment & Plan Note (Signed)
Presentation is suggestive of a COPD exacerbation. - Steroids and Levaquin due to allergy of penicillins - Chest x-ray - If no improvement would suggest being seen in the emergency department.

## 2016-12-07 NOTE — Progress Notes (Signed)
Manuel Reilly - 83 y.o. male MRN [id number]  Date of birth: [birth year]  SUBJECTIVE:  Including CC & ROS.  Chief Complaint  Patient presents with  . Nasal Congestion    cough and congestion, x3 days    Manuel Reilly is a 83 year old male presenting with cough and fever. He reports his symptoms started 3 days ago. He has taken some over-the-counter medications. He has had increased sputum production. He denies any sick contacts. He does have a history of COPD. He denies any significant shortness of breath. His symptoms seem to be worse at night. He has not had any travel recently. He denies any history of COPD exacerbations.   Chest x-ray from 2014 shows COPD changes. Lab work from 2015 shows normal kidney function.  Review of Systems  Constitutional: Positive for fever.  Respiratory: Positive for cough. Negative for shortness of breath.   Cardiovascular: Negative for chest pain.  Musculoskeletal: Negative.   Skin: Negative.     HISTORY: Past Medical, Surgical, Social, and Family History Reviewed & Updated per EMR.   Pertinent Historical Findings include:  Past Medical History:  Diagnosis Date  . Allergy   . Anxiety   . Diverticulosis   . History of colon polyps   . History of hemorrhoids   . History of inguinal herniorrhaphy    right  . Hyperlipidemia   . Hypertension   . Hypogonadism, male   . Sciatica    right    Past Surgical History:  Procedure Laterality Date  . CARPAL TUNNEL RELEASE    . HERNIA REPAIR      Allergies  Allergen Reactions  . Penicillins Hives, Shortness Of Breath and Rash    Has patient had a PCN reaction causing immediate rash, facial/tongue/throat swelling, SOB or lightheadedness with hypotension: Yes Has patient had a PCN reaction causing severe rash involving mucus membranes or skin necrosis: Yes Has patient had a PCN reaction that required hospitalization No Has patient had a PCN reaction occurring within the last 10 years: No If all of the  above answers are "NO", then may proceed with Cephalosporin use.   . Procaine Hcl Anaphylaxis and Swelling  . Wasp Venom Swelling    Swelling of throat and swelling at the site     Family History  Problem Relation Age of Onset  . Cancer Mother        colon  . Cancer Father        prostate  . Colon polyps Brother   . Diabetes Brother   . Heart disease Brother      Social History   Social History  . Marital status: Widowed    Spouse name: N/A  . Number of children: N/A  . Years of education: N/A   Occupational History  . Not on file.   Social History Main Topics  . Smoking status: Never Smoker  . Smokeless tobacco: Never Used  . Alcohol use Yes  . Drug use: No  . Sexual activity: Not on file   Other Topics Concern  . 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   He is dating-83 yr old woman he knew in high school      PHYSICAL EXAM:  VS: BP (!) 158/68 (BP Location: Left Arm, Patient Position: Sitting, Cuff Size:  Normal)   Pulse 85   Temp 100.1 F (37.8 C)   Resp 16   Ht 5' 10.5" (1.791 m)   Wt 168 lb (76.2 kg)   SpO2 91%   BMI 23.76 kg/m  Physical Exam Gen: NAD, alert, cooperative with exam, well-appearing ENT: normal lips, normal nasal mucosa,  Eye: normal EOM, normal conjunctiva and lids CV:  no edema, +2 pedal pulses, S1-S2   Resp: no accessory muscle use, non-labored, wheezing appreciated, Skin: no rashes, no areas of induration  Neuro: normal tone, normal sensation to touch Psych:  normal insight, alert and oriented MSK:  Normal gait, normal strength  ASSESSMENT & PLAN:   COPD exacerbation (HCC) Presentation is suggestive of a COPD exacerbation. - Steroids and Levaquin due to allergy of penicillins - Chest x-ray - If no improvement would suggest being seen in the emergency  department.

## 2016-12-07 NOTE — Patient Instructions (Signed)
Thank you for coming in,   Please use your inhaler every 4-6 hours for the next two days.   We will call you with the results from today   Please let us know if you're not getting any better.    Please feel free to call with any questions or concerns at any time, at [phone]. --Dr. Jordan Likes

## 2016-12-08 ENCOUNTER — Telehealth: Payer: Self-pay | Admitting: Family Medicine

## 2016-12-08 MED ORDER — ALPRAZOLAM 0.25 MG PO TABS: 0.2500 mg | ORAL_TABLET | Freq: Every evening | ORAL | 0 refills | Status: DC | PRN

## 2016-12-08 NOTE — Telephone Encounter (Signed)
Faxed to pharmacy

## 2016-12-08 NOTE — Addendum Note (Signed)
Addended by: Hillard Danker A on: 12/08/2016 01:53 PM   Modules accepted: Orders

## 2016-12-08 NOTE — Telephone Encounter (Signed)
Left VM for patient. If he calls back please have him speak with a nurse/CMA and inform that his chest xray didn't show pneumonia.   If any questions then please take the best time and phone number to call and I will try to call him back.   [name], MD [facility] 12/08/2016, 8:58 AM

## 2016-12-08 NOTE — Telephone Encounter (Signed)
Daughter called back.  She is on DPR.  Gave Dr. Jordan Likes response on x ray.

## 2017-06-05 ENCOUNTER — Other Ambulatory Visit: Payer: Self-pay

## 2017-06-05 ENCOUNTER — Telehealth: Payer: Self-pay | Admitting: Internal Medicine

## 2017-06-05 ENCOUNTER — Emergency Department (HOSPITAL_COMMUNITY)
Admission: EM | Admit: 2017-06-05 | Discharge: 2017-06-05 | Disposition: A | Discharge status: Home / Self Care | Payer: Medicare Other | Attending: Emergency Medicine | Admitting: Emergency Medicine

## 2017-06-05 ENCOUNTER — Encounter (HOSPITAL_COMMUNITY): Payer: Self-pay

## 2017-06-05 ENCOUNTER — Emergency Department (HOSPITAL_COMMUNITY): Payer: Medicare Other

## 2017-06-05 DIAGNOSIS — Z79899 Other long term (current) drug therapy: Secondary | ICD-10-CM | POA: Diagnosis not present

## 2017-06-05 DIAGNOSIS — K626 Ulcer of anus and rectum: Secondary | ICD-10-CM | POA: Diagnosis not present

## 2017-06-05 DIAGNOSIS — K625 Hemorrhage of anus and rectum: Secondary | ICD-10-CM | POA: Insufficient documentation

## 2017-06-05 DIAGNOSIS — R1030 Lower abdominal pain, unspecified: Secondary | ICD-10-CM | POA: Insufficient documentation

## 2017-06-05 DIAGNOSIS — J449 Chronic obstructive pulmonary disease, unspecified: Secondary | ICD-10-CM | POA: Insufficient documentation

## 2017-06-05 LAB — COMPREHENSIVE METABOLIC PANEL
ALT: 17 U/L (ref 17–63)
ANION GAP: 10 (ref 5–15)
AST: 20 U/L (ref 15–41)
Albumin: 3.4 g/dL — ABNORMAL LOW (ref 3.5–5.0)
Alkaline Phosphatase: 74 U/L (ref 38–126)
BUN: 20 mg/dL (ref 6–20)
CALCIUM: 8.8 mg/dL — AB (ref 8.9–10.3)
CHLORIDE: 105 mmol/L (ref 101–111)
CO2: 25 mmol/L (ref 22–32)
Creatinine, Ser: 0.76 mg/dL (ref 0.61–1.24)
Glucose, Bld: 114 mg/dL — ABNORMAL HIGH (ref 65–99)
Potassium: 4.2 mmol/L (ref 3.5–5.1)
SODIUM: 140 mmol/L (ref 135–145)
Total Bilirubin: 0.8 mg/dL (ref 0.3–1.2)
Total Protein: 5.9 g/dL — ABNORMAL LOW (ref 6.5–8.1)

## 2017-06-05 LAB — CBC
HCT: 41.2 % (ref 39.0–52.0)
HEMOGLOBIN: 13.8 g/dL (ref 13.0–17.0)
MCH: 30.9 pg (ref 26.0–34.0)
MCHC: 33.5 g/dL (ref 30.0–36.0)
MCV: 92.4 fL (ref 78.0–100.0)
Platelets: 238 10*3/uL (ref 150–400)
RBC: 4.46 MIL/uL (ref 4.22–5.81)
RDW: 13.4 % (ref 11.5–15.5)
WBC: 7.3 10*3/uL (ref 4.0–10.5)

## 2017-06-05 LAB — TYPE AND SCREEN
ABO/RH(D): O NEG
Antibody Screen: NEGATIVE

## 2017-06-05 LAB — ABO/RH: ABO/RH(D): O NEG

## 2017-06-05 LAB — POC OCCULT BLOOD, ED: FECAL OCCULT BLD: POSITIVE — AB

## 2017-06-05 MED ORDER — IOPAMIDOL (ISOVUE-300) INJECTION 61%
100.0000 mL | Freq: Once | INTRAVENOUS | Status: AC | PRN
  Administered 2017-06-05: 100 mL via INTRAVENOUS

## 2017-06-05 MED ORDER — IOPAMIDOL (ISOVUE-300) INJECTION 61%
INTRAVENOUS | Status: AC
  Filled 2017-06-05: qty 100

## 2017-06-05 MED ORDER — PANTOPRAZOLE SODIUM 20 MG PO TBEC: 20.0000 mg | DELAYED_RELEASE_TABLET | Freq: Every day | ORAL | 0 refills | Status: AC

## 2017-06-05 MED ORDER — PANTOPRAZOLE SODIUM 40 MG IV SOLR
40.0000 mg | Freq: Once | INTRAVENOUS | Status: AC
  Administered 2017-06-05: 40 mg via INTRAVENOUS
  Filled 2017-06-05: qty 40

## 2017-06-05 NOTE — Discharge Instructions (Addendum)
Get rechecked immediately if you start to develop multiple bloody stools, weakness, trouble breathing or feel as if you might pass out.

## 2017-06-05 NOTE — ED Provider Notes (Signed)
[facility] Provider Note   CSN: [id number] Arrival date & time: 06/05/17  1343     History   Chief Complaint Chief Complaint  Patient presents with  . Rectal Bleeding    HPI Manuel Reilly is a 84 y.o. male.  The history is provided by the patient and a relative. No language interpreter was used.  Rectal Bleeding   Manuel Reilly is a 84 y.o. male who presents to the Emergency Department complaining of bloody stools.  He presents to the emergency department accompanied by his daughter for evaluation of blood he stools for the last two days. Three days ago he developed lower abdominal discomfort and he took MiraLAX for the pain. He has persistent pain as well as blood he stools. He denies any fevers, nausea vomiting, diarrhea, dysuria. He had one dark, bloodied bowel movement yesterday and one dark, bloodied bowel movement today. His daughter states when she looked at his stool she did not feel like there was any blood present and appeared brown. Symptoms are moderate and constant in nature. He has no history of similar symptoms. He takes no blood thinners. He does not have a gastroenterologist.  Past Medical History:  Diagnosis Date  . Allergy   . Anxiety   . Diverticulosis   . History of colon polyps   . History of hemorrhoids   . History of inguinal herniorrhaphy    right  . Hyperlipidemia   . Hypogonadism, male   . Sciatica    right    Patient Active Problem List   Diagnosis Date Noted  . COPD exacerbation (HCC) 12/07/2016  . Acute low back pain 12/18/2015  . Greater trochanteric bursitis of right hip 03/05/2015  . Right hip pain 02/23/2015  . Acute upper respiratory infection 02/23/2015  . Generalized abdominal pain 11/13/2014  . Obstructive chronic bronchitis without exacerbation (HCC) 11/26/2012  . Hyperlipidemia 07/19/2007  . Elevated blood pressure (not hypertension) 07/19/2007    Past Surgical History:  Procedure  Laterality Date  . CARPAL TUNNEL RELEASE    . HERNIA REPAIR          Home Medications    Prior to Admission medications   Medication Sig Start Date End Date Taking? Authorizing Provider  acetaminophen (TYLENOL) 500 MG tablet Take 500 mg by mouth every 6 (six) hours as needed for mild pain, moderate pain, fever or headache.   Yes [provider]  albuterol (PROVENTIL HFA;VENTOLIN HFA) 108 (90 Base) MCG/ACT inhaler Inhale 2 puffs into the lungs every 6 (six) hours as needed for wheezing or shortness of breath.   Yes [provider]  ALPRAZolam (XANAX) 0.25 MG tablet Take 1 tablet (0.25 mg total) by mouth at bedtime as needed. Needs visit for refill Patient taking differently: Take 0.25 mg by mouth at bedtime as needed for sleep. Needs visit for refill 12/08/16  Yes [name], MD  cetirizine (ZYRTEC) 10 MG tablet Take 10 mg by mouth daily.   Yes [provider]  cyclobenzaprine (FLEXERIL) 5 MG tablet TAKE 1 TABLET 3 TIMES A DAY AS NEEDED FOR MUSCLE SPASM Patient not taking: Reported on 06/05/2017 05/04/16   [name], MD  levofloxacin (LEVAQUIN) 500 MG tablet Take 1 tablet (500 mg total) by mouth daily. For 7 days Patient not taking: Reported on 06/05/2017 12/07/16   [name], MD  pantoprazole (PROTONIX) 20 MG tablet Take 1 tablet (20 mg total) by mouth daily. 06/05/17   [name],  MD  predniSONE (DELTASONE) 20 MG tablet Take 2 tablets (40 mg total) by mouth daily with breakfast. For 5 days Patient not taking: Reported on 06/05/2017 12/07/16   [name], MD    Family History Family History  Problem Relation Age of Onset  . Cancer Mother        colon  . Cancer Father        prostate  . Colon polyps Brother   . Diabetes Brother   . Heart disease Brother     Social History Social History   Tobacco Use  . Smoking status: Never Smoker  . Smokeless tobacco: Never Used  Substance Use Topics  . Alcohol use: Yes     Comment: occasionally  . Drug use: No     Allergies   Penicillins; Procaine hcl; and Wasp venom   Review of Systems Review of Systems  Gastrointestinal: Positive for hematochezia.  All other systems reviewed and are negative.    Physical Exam Updated Vital Signs BP 130/64 (BP Location: Left Arm)   Pulse 79   Temp 98.2 F (36.8 C) (Oral)   Resp 16   Ht 5\' 9"  (1.753 m)   Wt 72.6 kg (160 lb)   SpO2 95%   BMI 23.63 kg/m   Physical Exam  Constitutional: He is oriented to person, place, and time. He appears well-developed and well-nourished.  HENT:  Head: Normocephalic and atraumatic.  Cardiovascular: Normal rate and regular rhythm.  No murmur heard. Pulmonary/Chest: Effort normal and breath sounds normal. No respiratory distress.  Abdominal: Soft. There is no tenderness. There is no rebound and no guarding.  Genitourinary:  Genitourinary Comments: Nontender. A small amount of black stool present.  Musculoskeletal: He exhibits no edema or tenderness.  Neurological: He is alert and oriented to person, place, and time.  Skin: Skin is warm and dry.  Psychiatric: He has a normal mood and affect. His behavior is normal.  Nursing note and vitals reviewed.    ED Treatments / Results  Labs (all labs ordered are listed, but only abnormal results are displayed) Labs Reviewed  COMPREHENSIVE METABOLIC PANEL - Abnormal; Notable for the following components:      Result Value   Glucose, Bld 114 (*)    Calcium 8.8 (*)    Total Protein 5.9 (*)    Albumin 3.4 (*)    All other components within normal limits  POC OCCULT BLOOD, ED - Abnormal; Notable for the following components:   Fecal Occult Bld POSITIVE (*)    All other components within normal limits  CBC  TYPE AND SCREEN  ABO/RH    EKG None  Radiology Ct Abdomen Pelvis W Contrast  Result Date: 06/05/2017 CLINICAL DATA:  Rectal bleeding for the past 2 days. EXAM: CT ABDOMEN AND PELVIS WITH CONTRAST TECHNIQUE:  Multidetector CT imaging of the abdomen and pelvis was performed using the standard protocol following bolus administration of intravenous contrast. CONTRAST:  ISOVUE-300 IOPAMIDOL (ISOVUE-300) INJECTION 61% COMPARISON:  None. FINDINGS: Lower chest: No acute abnormality. Severe emphysematous changes at the lung bases. Post infectious or postinflammatory scarring in the right greater than left lower lobes. Hepatobiliary: 1.6 cm simple cyst in the left liver. The gallbladder is unremarkable. No biliary dilatation. Pancreas: Severe fatty atrophy of the pancreas. No ductal dilatation or surrounding inflammatory changes. Spleen: Normal in size without focal abnormality. Adrenals/Urinary Tract: The adrenal glands are unremarkable. Mild bilateral renal cortical atrophy. Subcentimeter low-density lesions in both kidneys are too small to characterize.  No renal or ureteral calculi. No hydronephrosis. The bladder is under distended. Stomach/Bowel: There is focal wall thickening and mucosal enhancement of the second portion of the duodenum. The stomach is within normal limits. No bowel obstruction. Mild colonic diverticulosis. Normal appendix. Vascular/Lymphatic: Aortic atherosclerosis. No enlarged abdominal or pelvic lymph nodes. Reproductive: Borderline prostatomegaly containing coarse central calcifications. Other: No free fluid or pneumoperitoneum. Moderate fat containing left inguinal hernia. Partially visualized right hydrocele. Musculoskeletal: No acute or significant osseous findings. Degenerative changes of the lumbar spine worst at L3-L4. Lumbarization of S1. IMPRESSION: 1. Focal wall thickening of the second portion of the duodenum, favoring peptic ulcer disease/duodenitis given clinical history. 2.  Aortic atherosclerosis (ICD10-I70.0). 3.  Emphysema (ICD10-J43.9). Electronically Signed   By: Obie DredgeWilliam T Derry M.D.   On: 06/05/2017 17:51    Procedures Procedures (including critical care time)  Medications  Ordered in ED Medications  iopamidol (ISOVUE-300) 61 % injection 100 mL (100 mLs Intravenous Contrast Given 06/05/17 1725)  pantoprazole (PROTONIX) injection 40 mg (40 mg Intravenous Given 06/05/17 1858)     Initial Impression / Assessment and Plan / ED Course  I have reviewed the triage vital signs and the nursing notes.  Pertinent labs & imaging results that were available during my care of the patient were reviewed by me and considered in my medical decision making (see chart for details).    patient here for evaluation of lower abdominal discomfort for three days as well as two days of black bowel movements. 1 BM yesterday and one today. He is well appearing on examination and in no acute distress. Hemoglobin is within normal limits. CT scan does demonstrate some duodenal thickening, concerning for duodenitis. Vital signs are stable and he is asymptomatic on assessment in the emergency department. Discussed with on-call gastroenterologist. Plan to start on PPI with close outpatient follow-up as well as return precautions. Patient is in agreement with plan.  No current evidence of major hemorrhage. Final Clinical Impressions(s) / ED Diagnoses   Final diagnoses:  Rectal bleeding    ED Discharge Orders        Ordered    pantoprazole (PROTONIX) 20 MG tablet  Daily     06/05/17 1905       Tilden Fossaees, Roxie Gueye, MD 06/06/17 [phone]

## 2017-06-05 NOTE — ED Triage Notes (Signed)
Patient reports that he has had rectal bleeding x 2 days. Patient states it started out as black and is now maroon colored. Patient c/o abdominal cramping when he has to have a BM.

## 2017-06-05 NOTE — ED Notes (Signed)
Patient transported to CT 

## 2017-06-05 NOTE — ED Notes (Signed)
Wife verbalizes someone told her/pt that scan results should have been back a while ago. Madilyn Hookees MD notified and verbalizes will be in to see pt/family in a few minutes. Pt/family notified provider will be in to speak with them in a few minutes.

## 2017-06-05 NOTE — ED Notes (Signed)
ED Provider at bedside. 

## 2017-06-05 NOTE — Telephone Encounter (Signed)
Call from ER MD tonight Pt presented there with dark stools at home and he was concerned about gi bleeding. Also some lower abd pain.  In ER stool was dark and Heme positive.  ER MD stated it did not seem/appear truly melenic but was heme + Hgb was normal No blood thinners CT abd suggested possible thickening in 2nd part of duodenum. ER MD felt pt looked well and did not meet specific criteria for admission.  Decision made to d/c home. He will need an appt with us soon, with APP this week if possible.  Otherwise will need to see PCP ASAP I recommend repeat Hgb in 48 hours. He has been started on daily PPI for now.   Pt of Dr. Russella DarStark.  I will alert Dr. Russella DarStark and his RN, Lavonna RuaSheri.

## 2017-06-06 NOTE — Telephone Encounter (Signed)
Patient has been scheduled with Willette ClusterPaula Guenther RNP for 06/07/17 9:30.  I left a message on both numbers asking the patient or the family to return call to confirm appt.

## 2017-06-06 NOTE — Telephone Encounter (Signed)
See Dr Lauro FranklinPyrtle's message. Needs office appt this week. Likely will need EGD too.

## 2017-06-07 ENCOUNTER — Ambulatory Visit: Payer: Medicare Other | Admitting: Nurse Practitioner

## 2017-06-07 NOTE — Telephone Encounter (Signed)
Patient's [facility] called yesterday and [facility]

## 2017-07-27 ENCOUNTER — Telehealth: Payer: Self-pay | Admitting: Emergency Medicine

## 2017-07-27 NOTE — Telephone Encounter (Signed)
Called patient to schedule AWV. Patient will call back at later date to schedule. 

## 2017-09-09 ENCOUNTER — Other Ambulatory Visit: Payer: Self-pay

## 2017-09-09 ENCOUNTER — Emergency Department (HOSPITAL_COMMUNITY): Payer: Medicare Other

## 2017-09-09 ENCOUNTER — Emergency Department (HOSPITAL_COMMUNITY)
Admission: EM | Admit: 2017-09-09 | Discharge: 2017-09-10 | Disposition: A | Discharge status: Home / Self Care | Payer: Medicare Other | Attending: Emergency Medicine | Admitting: Emergency Medicine

## 2017-09-09 ENCOUNTER — Encounter (HOSPITAL_COMMUNITY): Payer: Self-pay | Admitting: *Deleted

## 2017-09-09 DIAGNOSIS — R079 Chest pain, unspecified: Secondary | ICD-10-CM | POA: Diagnosis not present

## 2017-09-09 DIAGNOSIS — R1013 Epigastric pain: Secondary | ICD-10-CM | POA: Diagnosis not present

## 2017-09-09 DIAGNOSIS — Z79899 Other long term (current) drug therapy: Secondary | ICD-10-CM | POA: Diagnosis not present

## 2017-09-09 DIAGNOSIS — R1110 Vomiting, unspecified: Secondary | ICD-10-CM | POA: Diagnosis not present

## 2017-09-09 DIAGNOSIS — D72829 Elevated white blood cell count, unspecified: Secondary | ICD-10-CM | POA: Insufficient documentation

## 2017-09-09 DIAGNOSIS — R600 Localized edema: Secondary | ICD-10-CM | POA: Insufficient documentation

## 2017-09-09 DIAGNOSIS — R112 Nausea with vomiting, unspecified: Secondary | ICD-10-CM | POA: Diagnosis not present

## 2017-09-09 DIAGNOSIS — K5730 Diverticulosis of large intestine without perforation or abscess without bleeding: Secondary | ICD-10-CM | POA: Diagnosis not present

## 2017-09-09 DIAGNOSIS — R0789 Other chest pain: Secondary | ICD-10-CM | POA: Diagnosis not present

## 2017-09-09 DIAGNOSIS — R609 Edema, unspecified: Secondary | ICD-10-CM

## 2017-09-09 DIAGNOSIS — R1111 Vomiting without nausea: Secondary | ICD-10-CM

## 2017-09-09 DIAGNOSIS — J449 Chronic obstructive pulmonary disease, unspecified: Secondary | ICD-10-CM | POA: Insufficient documentation

## 2017-09-09 LAB — CBC
HCT: 47.7 % (ref 39.0–52.0)
Hemoglobin: 16 g/dL (ref 13.0–17.0)
MCH: 30.5 pg (ref 26.0–34.0)
MCHC: 33.5 g/dL (ref 30.0–36.0)
MCV: 90.9 fL (ref 78.0–100.0)
PLATELETS: 199 10*3/uL (ref 150–400)
RBC: 5.25 MIL/uL (ref 4.22–5.81)
RDW: 13.5 % (ref 11.5–15.5)
WBC: 14.6 10*3/uL — ABNORMAL HIGH (ref 4.0–10.5)

## 2017-09-09 LAB — BASIC METABOLIC PANEL
Anion gap: 11 (ref 5–15)
BUN: 19 mg/dL (ref 8–23)
CALCIUM: 9.1 mg/dL (ref 8.9–10.3)
CO2: 26 mmol/L (ref 22–32)
CREATININE: 0.86 mg/dL (ref 0.61–1.24)
Chloride: 99 mmol/L (ref 98–111)
GFR calc Af Amer: >60 mL/min (ref >60)
Glucose, Bld: 111 mg/dL — ABNORMAL HIGH (ref 70–99)
Potassium: 4 mmol/L (ref 3.5–5.1)
Sodium: 136 mmol/L (ref 135–145)

## 2017-09-09 LAB — I-STAT TROPONIN, ED: TROPONIN I, POC: 0 ng/mL (ref 0.00–0.08)

## 2017-09-09 MED ORDER — FAMOTIDINE IN NACL 20-0.9 MG/50ML-% IV SOLN
20.0000 mg | Freq: Once | INTRAVENOUS | Status: AC
  Administered 2017-09-10: 20 mg via INTRAVENOUS
  Filled 2017-09-09: qty 50

## 2017-09-09 MED ORDER — FUROSEMIDE 10 MG/ML IJ SOLN
20.0000 mg | Freq: Once | INTRAMUSCULAR | Status: AC
  Administered 2017-09-09: 20 mg via INTRAVENOUS
  Filled 2017-09-09: qty 4

## 2017-09-09 MED ORDER — IOPAMIDOL (ISOVUE-370) INJECTION 76%
100.0000 mL | Freq: Once | INTRAVENOUS | Status: AC | PRN
  Administered 2017-09-09: 100 mL via INTRAVENOUS

## 2017-09-09 MED ORDER — ONDANSETRON HCL 4 MG/2ML IJ SOLN
4.0000 mg | Freq: Once | INTRAMUSCULAR | Status: AC
  Administered 2017-09-10: 4 mg via INTRAVENOUS
  Filled 2017-09-09: qty 2

## 2017-09-09 MED ORDER — IOPAMIDOL (ISOVUE-370) INJECTION 76%
INTRAVENOUS | Status: AC
  Administered 2017-09-09: 100 mL via INTRAVENOUS
  Filled 2017-09-09: qty 100

## 2017-09-09 NOTE — ED Notes (Signed)
Patient transported to CT 

## 2017-09-09 NOTE — ED Provider Notes (Signed)
[facility]-EMERGENCY DEPT Provider Note   CSN: [id number] Arrival date & time: 09/09/17  2003     History   Chief Complaint Chief Complaint  Patient presents with  . Chest Pain    last night  . Leg Swelling    HPI Manuel Reilly is a 84 y.o. male.  HPI Patient started to develop a chest discomfort.  Is been in his central chest and epigastric.  He has had some shortness of breath.  He reports that he has started swelling in his legs.  He denies that the legs hurt.  He does feel a recurrent pressure in his epigastrium and has been spitting up clear fluid.  He denies that he had been eating or anything got stuck.  He reports he cuts his food really finally.  Patient has not had similar symptoms previously. Past Medical History:  Diagnosis Date  . Allergy   . Anxiety   . Diverticulosis   . History of colon polyps   . History of hemorrhoids   . History of inguinal herniorrhaphy    right  . Hyperlipidemia   . Hypogonadism, male   . Sciatica    right    Patient Active Problem List   Diagnosis Date Noted  . COPD exacerbation (HCC) 12/07/2016  . Acute low back pain 12/18/2015  . Greater trochanteric bursitis of right hip 03/05/2015  . Right hip pain 02/23/2015  . Acute upper respiratory infection 02/23/2015  . Generalized abdominal pain 11/13/2014  . Obstructive chronic bronchitis without exacerbation (HCC) 11/26/2012  . Hyperlipidemia 07/19/2007  . Elevated blood pressure (not hypertension) 07/19/2007    Past Surgical History:  Procedure Laterality Date  . CARPAL TUNNEL RELEASE    . HERNIA REPAIR          Home Medications    Prior to Admission medications   Medication Sig Start Date End Date Taking? Authorizing Provider  acetaminophen (TYLENOL) 500 MG tablet Take 500 mg by mouth every 6 (six) hours as needed for mild pain, moderate pain, fever or headache.    [provider]  albuterol (PROVENTIL HFA;VENTOLIN HFA) 108 (90 Base)  MCG/ACT inhaler Inhale 2 puffs into the lungs every 6 (six) hours as needed for wheezing or shortness of breath.    [provider]  ALPRAZolam [name]) 0.25 MG tablet Take 1 tablet (0.25 mg total) by mouth at bedtime as needed. Needs visit for refill Patient taking differently: Take 0.25 mg by mouth at bedtime as needed for sleep. Needs visit for refill 12/08/16   [name], MD  cetirizine (ZYRTEC) 10 MG tablet Take 10 mg by mouth daily.    [provider]  cyclobenzaprine (FLEXERIL) 5 MG tablet TAKE 1 TABLET 3 TIMES A DAY AS NEEDED FOR MUSCLE SPASM Patient not taking: Reported on 06/05/2017 05/04/16   [name], MD  levofloxacin (LEVAQUIN) 500 MG tablet Take 1 tablet (500 mg total) by mouth daily. For 7 days Patient not taking: Reported on 06/05/2017 12/07/16   [name], MD  pantoprazole (PROTONIX) 20 MG tablet Take 1 tablet (20 mg total) by mouth daily. 06/05/17   [name], MD  predniSONE (DELTASONE) 20 MG tablet Take 2 tablets (40 mg total) by mouth daily with breakfast. For 5 days Patient not taking: Reported on 06/05/2017 12/07/16   [name], MD    Family History Family History  Problem Relation Age of Onset  . Cancer Mother  colon  . Cancer Father        prostate  . Colon polyps Brother   . Diabetes Brother   . Heart disease Brother     Social History Social History   Tobacco Use  . Smoking status: Never Smoker  . Smokeless tobacco: Never Used  Substance Use Topics  . Alcohol use: Yes    Comment: occasionally  . Drug use: No     Allergies   Penicillins; Procaine hcl; and Wasp venom   Review of Systems Review of Systems  10 Systems reviewed and are negative for acute change except as noted in the HPI. Physical Exam Updated Vital Signs BP (!) 131/48 (BP Location: Right Arm)   Pulse 74   Temp 99.5 F (37.5 C) (Oral)   Resp 18   Ht 5\' 10"  (1.778 m)   Wt 77.4 kg (170 lb 9.6 oz)   SpO2 93%   BMI 24.48  kg/m   Physical Exam  Constitutional: He is oriented to person, place, and time. He appears well-developed and well-nourished.  Patient is alert without active respiratory distress.  He is periodically coughing up a clear phlegm that he has to expectorate.  HENT:  Head: Normocephalic and atraumatic.  Mouth/Throat: Oropharynx is clear and moist.  Eyes: EOM are normal.  Neck: Neck supple.  Cardiovascular: Normal rate, regular rhythm, normal heart sounds and intact distal pulses.  Pulmonary/Chest: Effort normal.  Crackles left lung base.  Lungs otherwise clear.  Patient has frequent throat clearing and sometimes expectorates clear sputum.  Abdominal: Soft. Bowel sounds are normal. He exhibits no distension. There is tenderness. There is no guarding.  Epigastrium mildly tender to palpation.  No guarding.  Lower abdomen nontender.  Musculoskeletal: Normal range of motion. He exhibits edema.  Patient with 2+ edema bilateral lower extremities.  Faint erythema bilateral lower extremities.  No wounds.  He denies the calves are tender to palpation.  Neurological: He is alert and oriented to person, place, and time. He has normal strength. He exhibits normal muscle tone. Coordination normal. GCS eye subscore is 4. GCS verbal subscore is 5. GCS motor subscore is 6.  Skin: Skin is warm, dry and intact.  Psychiatric: He has a normal mood and affect.     ED Treatments / Results  Labs (all labs ordered are listed, but only abnormal results are displayed) Labs Reviewed  BASIC METABOLIC PANEL - Abnormal; Notable for the following components:      Result Value   Glucose, Bld 111 (*)    All other components within normal limits  CBC - Abnormal; Notable for the following components:   WBC 14.6 (*)    All other components within normal limits  HEPATIC FUNCTION PANEL  LIPASE, BLOOD  I-STAT TROPONIN, ED    EKG EKG Interpretation  Date/Time:  Saturday September 09 2017 21:10:39 EDT Ventricular Rate:    72 PR Interval:    QRS Duration: 143 QT Interval:  440 QTC Calculation: 482 R Axis:   -41 Text Interpretation:  Sinus rhythm Atrial premature complex RBBB and LAFB Probable lateral infarct, old Baseline wander in lead(s) I no change from previous Confirmed by Arby Barrette [phone]) on 09/09/2017 11:34:45 PM   Radiology Dg Chest 2 View  Result Date: 09/09/2017 CLINICAL DATA:  Chest pain last night. EXAM: CHEST - 2 VIEW COMPARISON:  December 07, 2016 FINDINGS: There appears to be a bleb in the left base. No pneumothorax. The heart, hila, and mediastinum are normal. No pulmonary nodules,  masses, or focal infiltrates. COPD. IMPRESSION: No active cardiopulmonary disease. Electronically Signed   By: Gerome Samavid  Williams III M.D   On: 09/09/2017 21:29    Procedures Procedures (including critical care time)  Medications Ordered in ED Medications  furosemide (LASIX) injection 20 mg (has no administration in time range)     Initial Impression / Assessment and Plan / ED Course  I have reviewed the triage vital signs and the nursing notes.  Pertinent labs & imaging results that were available during my care of the patient were reviewed by me and considered in my medical decision making (see chart for details).      Final Clinical Impressions(s) / ED Diagnoses   Final diagnoses:  Chest pain, unspecified type  Peripheral edema  Leukocytosis, unspecified type  Vomiting without nausea, intractability of vomiting not specified, unspecified vomiting type   Patient has had a chest pain and some nausea with shortness of breath.  EKG is unchanged.  First troponin negative.  Symptoms of chest pain are central.  He does have peripheral edema.  Difficult to determine how long the edema is been present.  Consideration is given to CHF\PE\pneumonia\cardiac ischemia\gastritis with vomiting.  Patient has been expectorating clear liquid looks like saliva.  This would be suggestive of esophageal impaction but  patient adamantly denies any episode of difficulty swallowing or food catching.  We will proceed with CT to rule out multiple etiologies of potential thoracic and GI origin.  Patient is alert and does not have respiratory distress.  Does have peripheral edema that is symmetric.  Will give 1 dose of IV Lasix while awaiting CT results.  Dr. Adela LankFloyd will review CT results and review clinical assessment of patient for final disposition.  I do feel, that if the CT does not have any acute findings patient is otherwise clinically stable for continued home management. ED Discharge Orders    None       Arby BarrettePfeiffer, Teri Legacy, MD 09/10/17 [phone]

## 2017-09-09 NOTE — ED Notes (Signed)
Spoke with Tasha in the lab, lab to add on lipase and HFP

## 2017-09-09 NOTE — ED Triage Notes (Signed)
Pt c/o chest pain last night.  Pt stated "it was like acid." Vomited x 1 around 1900 today.  Pt denies c/p @ present.  Pt also presents with bilateral LE edema.

## 2017-09-10 DIAGNOSIS — R079 Chest pain, unspecified: Secondary | ICD-10-CM | POA: Diagnosis not present

## 2017-09-10 DIAGNOSIS — K5730 Diverticulosis of large intestine without perforation or abscess without bleeding: Secondary | ICD-10-CM | POA: Diagnosis not present

## 2017-09-10 LAB — LIPASE, BLOOD: LIPASE: 21 U/L (ref 11–51)

## 2017-09-10 LAB — HEPATIC FUNCTION PANEL
ALBUMIN: 4 g/dL (ref 3.5–5.0)
ALT: 28 U/L (ref 0–44)
AST: 42 U/L — AB (ref 15–41)
Alkaline Phosphatase: 78 U/L (ref 38–126)
BILIRUBIN DIRECT: 0.4 mg/dL — AB (ref 0.0–0.2)
Indirect Bilirubin: 1.1 mg/dL — ABNORMAL HIGH (ref 0.3–0.9)
Total Bilirubin: 1.5 mg/dL — ABNORMAL HIGH (ref 0.3–1.2)
Total Protein: 7 g/dL (ref 6.5–8.1)

## 2017-09-10 NOTE — Discharge Instructions (Signed)
Return if you cant eat or drink anything or if your symptoms worsen

## 2017-09-10 NOTE — ED Provider Notes (Signed)
84 yo M with a chief complaint of chest pain and vomiting.  The patient also has had lower extremity edema.  I received the patient in signout from Dr. Clarice PolePfeifer, plan is for a CT scan of the chest abdomen pelvis to further evaluate if this is negative for any acute findings then he can likely be discharged home with follow-up with his PCP.  CT scan with new dilated fluid-filled esophagus with debris in the distal esophagus, some concern initially that the patient can tolerate by mouth but he was able to take liquids without difficulty.  We will give him GI follow-up.  He otherwise is feeling better.   Melene PlanFloyd, Khylei Wilms, DO 09/10/17 [phone]

## 2017-09-19 ENCOUNTER — Encounter: Payer: Self-pay | Admitting: Internal Medicine

## 2017-09-19 ENCOUNTER — Ambulatory Visit (INDEPENDENT_AMBULATORY_CARE_PROVIDER_SITE_OTHER): Payer: Medicare Other | Admitting: Internal Medicine

## 2017-09-19 VITALS — BP 132/70 | HR 67 | Temp 98.6°F | Ht 70.0 in | Wt 167.0 lb

## 2017-09-19 DIAGNOSIS — I872 Venous insufficiency (chronic) (peripheral): Secondary | ICD-10-CM

## 2017-09-19 DIAGNOSIS — F5101 Primary insomnia: Secondary | ICD-10-CM

## 2017-09-19 DIAGNOSIS — K220 Achalasia of cardia: Secondary | ICD-10-CM

## 2017-09-19 MED ORDER — ALPRAZOLAM 0.25 MG PO TABS: 0.2500 mg | ORAL_TABLET | Freq: Every evening | ORAL | 0 refills | Status: DC | PRN

## 2017-09-19 NOTE — Patient Instructions (Signed)
We have sent in the refills today.  Work on keeping your legs propped up when sitting to help the fluid. We have given you a prescription for the compression stockings to wear daily.

## 2017-09-20 DIAGNOSIS — G4700 Insomnia, unspecified: Secondary | ICD-10-CM | POA: Insufficient documentation

## 2017-09-20 DIAGNOSIS — I872 Venous insufficiency (chronic) (peripheral): Secondary | ICD-10-CM | POA: Insufficient documentation

## 2017-09-20 NOTE — Progress Notes (Signed)
   Subjective:    Patient ID: Manuel Reilly, male    DOB: [birth year], 84 y.o.   MRN: [id number]  HPI The patient is a 84 YO man coming in for several concerns including ER follow up (in several times for GERD and esophagus blocked with food caught, given coca cola and this cleared things up). He denies more problems with this since ER visit, taking the protonix daily he thinks which may be helping. He denies blood in stool, nausea or vomiting, diarrhea or constipation. He is also having some more problems with sleeping (has rare xanax usage for sleep, his girlfriend recently diagnosed with terminal cancer and has limited time left to live, he is distraught about this, denies SI/HI) as well as leg swelling (has been going on for some time, they swell when he is up on them more, go down at night time, is not propping them up, his daughter has suggested this and is here with him today to help provide history).   Review of Systems  Constitutional: Negative.   HENT: Negative.   Eyes: Negative.   Respiratory: Negative for cough, chest tightness and shortness of breath.   Cardiovascular: Positive for leg swelling. Negative for chest pain and palpitations.  Gastrointestinal: Negative for abdominal distention, abdominal pain, constipation, diarrhea, nausea and vomiting.       Prior esophagus blockage  Musculoskeletal: Negative.   Skin: Negative.   Neurological: Negative.   Psychiatric/Behavioral: Positive for dysphoric mood and sleep disturbance. Negative for behavioral problems, confusion, self-injury and suicidal ideas. The patient is not nervous/anxious and is not hyperactive.       Objective:   Physical Exam  Constitutional: He is oriented to person, place, and time. He appears well-developed and well-nourished.  HENT:  Head: Normocephalic and atraumatic.  Eyes: EOM are normal.  Neck: Normal range of motion.  Cardiovascular: Normal rate and regular rhythm.  Pulmonary/Chest: Effort normal  and breath sounds normal. No respiratory distress. He has no wheezes. He has no rales.  Abdominal: Soft. Bowel sounds are normal. He exhibits no distension. There is no tenderness. There is no rebound.  Musculoskeletal: He exhibits edema.  1-2+ edema to mid shins bilaterally  Neurological: He is alert and oriented to person, place, and time. Coordination normal.  Skin: Skin is warm and dry.  Psychiatric: He has a normal mood and affect.   Vitals:   09/19/17 1410  BP: 132/70  Pulse: 67  Temp: 98.6 F (37 C)  TempSrc: Oral  SpO2: 92%  Weight: 167 lb (75.8 kg)  Height: 5\' 10"  (1.778 m)      Assessment & Plan:

## 2017-09-20 NOTE — Assessment & Plan Note (Signed)
Compression stockings rx given at visit. He did decline labs today given lots of recent at the ER.

## 2017-09-20 NOTE — Assessment & Plan Note (Signed)
Refill xanax and he is going through a very tough time currently. We talked about the risks of long term usage and regular usage. He uses rarely (last refill Oct 2018 for 30).

## 2017-09-20 NOTE — Assessment & Plan Note (Signed)
Some blockage found on imaging and he was able to clear in the ER. He denies having this problem in the past. He would like to hold off on GI evaluation unless he has the problem again. Agree that this keeps with his desired plan of avoiding unnecessary procedures and labs.

## 2018-01-31 ENCOUNTER — Emergency Department (HOSPITAL_COMMUNITY): Payer: Medicare Other | Admitting: Certified Registered Nurse Anesthetist

## 2018-01-31 ENCOUNTER — Emergency Department (HOSPITAL_COMMUNITY): Payer: Medicare Other

## 2018-01-31 ENCOUNTER — Inpatient Hospital Stay (HOSPITAL_COMMUNITY): Payer: Medicare Other

## 2018-01-31 ENCOUNTER — Encounter (HOSPITAL_COMMUNITY): Payer: Self-pay

## 2018-01-31 ENCOUNTER — Inpatient Hospital Stay (HOSPITAL_COMMUNITY)
Admission: EM | Admit: 2018-01-31 | Discharge: 2018-02-11 | DRG: 380 | Disposition: A | Discharge status: Hospice | Payer: Medicare Other | Attending: Internal Medicine | Admitting: Internal Medicine

## 2018-01-31 ENCOUNTER — Encounter (HOSPITAL_COMMUNITY)
Admission: EM | Disposition: A | Discharge status: Hospice | Payer: Self-pay | Source: Home / Self Care | Attending: Internal Medicine

## 2018-01-31 ENCOUNTER — Other Ambulatory Visit: Payer: Self-pay

## 2018-01-31 DIAGNOSIS — R000 Tachycardia, unspecified: Secondary | ICD-10-CM | POA: Diagnosis not present

## 2018-01-31 DIAGNOSIS — I9589 Other hypotension: Secondary | ICD-10-CM | POA: Diagnosis not present

## 2018-01-31 DIAGNOSIS — E861 Hypovolemia: Secondary | ICD-10-CM | POA: Diagnosis not present

## 2018-01-31 DIAGNOSIS — I10 Essential (primary) hypertension: Secondary | ICD-10-CM | POA: Diagnosis not present

## 2018-01-31 DIAGNOSIS — J90 Pleural effusion, not elsewhere classified: Secondary | ICD-10-CM

## 2018-01-31 DIAGNOSIS — I81 Portal vein thrombosis: Secondary | ICD-10-CM | POA: Diagnosis not present

## 2018-01-31 DIAGNOSIS — R58 Hemorrhage, not elsewhere classified: Secondary | ICD-10-CM | POA: Diagnosis not present

## 2018-01-31 DIAGNOSIS — R1311 Dysphagia, oral phase: Secondary | ICD-10-CM

## 2018-01-31 DIAGNOSIS — Z515 Encounter for palliative care: Secondary | ICD-10-CM | POA: Diagnosis not present

## 2018-01-31 DIAGNOSIS — M2550 Pain in unspecified joint: Secondary | ICD-10-CM | POA: Diagnosis not present

## 2018-01-31 DIAGNOSIS — N179 Acute kidney failure, unspecified: Secondary | ICD-10-CM | POA: Diagnosis present

## 2018-01-31 DIAGNOSIS — Z8719 Personal history of other diseases of the digestive system: Secondary | ICD-10-CM | POA: Diagnosis not present

## 2018-01-31 DIAGNOSIS — R1310 Dysphagia, unspecified: Secondary | ICD-10-CM | POA: Diagnosis not present

## 2018-01-31 DIAGNOSIS — Z884 Allergy status to anesthetic agent status: Secondary | ICD-10-CM | POA: Diagnosis not present

## 2018-01-31 DIAGNOSIS — Z6823 Body mass index (BMI) 23.0-23.9, adult: Secondary | ICD-10-CM

## 2018-01-31 DIAGNOSIS — J439 Emphysema, unspecified: Secondary | ICD-10-CM | POA: Diagnosis present

## 2018-01-31 DIAGNOSIS — I4891 Unspecified atrial fibrillation: Secondary | ICD-10-CM | POA: Diagnosis not present

## 2018-01-31 DIAGNOSIS — I959 Hypotension, unspecified: Secondary | ICD-10-CM | POA: Diagnosis not present

## 2018-01-31 DIAGNOSIS — I248 Other forms of acute ischemic heart disease: Secondary | ICD-10-CM | POA: Diagnosis not present

## 2018-01-31 DIAGNOSIS — E785 Hyperlipidemia, unspecified: Secondary | ICD-10-CM | POA: Diagnosis not present

## 2018-01-31 DIAGNOSIS — Z833 Family history of diabetes mellitus: Secondary | ICD-10-CM | POA: Diagnosis not present

## 2018-01-31 DIAGNOSIS — E43 Unspecified severe protein-calorie malnutrition: Secondary | ICD-10-CM | POA: Diagnosis not present

## 2018-01-31 DIAGNOSIS — K5710 Diverticulosis of small intestine without perforation or abscess without bleeding: Secondary | ICD-10-CM | POA: Diagnosis not present

## 2018-01-31 DIAGNOSIS — K0889 Other specified disorders of teeth and supporting structures: Secondary | ICD-10-CM | POA: Diagnosis not present

## 2018-01-31 DIAGNOSIS — K567 Ileus, unspecified: Secondary | ICD-10-CM | POA: Diagnosis present

## 2018-01-31 DIAGNOSIS — M6258 Muscle wasting and atrophy, not elsewhere classified, other site: Secondary | ICD-10-CM | POA: Diagnosis not present

## 2018-01-31 DIAGNOSIS — I4819 Other persistent atrial fibrillation: Secondary | ICD-10-CM | POA: Diagnosis present

## 2018-01-31 DIAGNOSIS — K255 Chronic or unspecified gastric ulcer with perforation: Secondary | ICD-10-CM | POA: Diagnosis not present

## 2018-01-31 DIAGNOSIS — B370 Candidal stomatitis: Secondary | ICD-10-CM | POA: Diagnosis not present

## 2018-01-31 DIAGNOSIS — I480 Paroxysmal atrial fibrillation: Secondary | ICD-10-CM | POA: Diagnosis present

## 2018-01-31 DIAGNOSIS — R0902 Hypoxemia: Secondary | ICD-10-CM

## 2018-01-31 DIAGNOSIS — Z809 Family history of malignant neoplasm, unspecified: Secondary | ICD-10-CM | POA: Diagnosis not present

## 2018-01-31 DIAGNOSIS — H9190 Unspecified hearing loss, unspecified ear: Secondary | ICD-10-CM | POA: Diagnosis present

## 2018-01-31 DIAGNOSIS — Z66 Do not resuscitate: Secondary | ICD-10-CM | POA: Diagnosis present

## 2018-01-31 DIAGNOSIS — Z8249 Family history of ischemic heart disease and other diseases of the circulatory system: Secondary | ICD-10-CM

## 2018-01-31 DIAGNOSIS — E872 Acidosis: Secondary | ICD-10-CM | POA: Diagnosis not present

## 2018-01-31 DIAGNOSIS — J449 Chronic obstructive pulmonary disease, unspecified: Secondary | ICD-10-CM

## 2018-01-31 DIAGNOSIS — R1010 Upper abdominal pain, unspecified: Secondary | ICD-10-CM | POA: Diagnosis not present

## 2018-01-31 DIAGNOSIS — G9341 Metabolic encephalopathy: Secondary | ICD-10-CM | POA: Diagnosis not present

## 2018-01-31 DIAGNOSIS — J9601 Acute respiratory failure with hypoxia: Secondary | ICD-10-CM | POA: Diagnosis not present

## 2018-01-31 DIAGNOSIS — K56600 Partial intestinal obstruction, unspecified as to cause: Secondary | ICD-10-CM | POA: Diagnosis not present

## 2018-01-31 DIAGNOSIS — K5730 Diverticulosis of large intestine without perforation or abscess without bleeding: Secondary | ICD-10-CM | POA: Diagnosis present

## 2018-01-31 DIAGNOSIS — F419 Anxiety disorder, unspecified: Secondary | ICD-10-CM | POA: Diagnosis present

## 2018-01-31 DIAGNOSIS — D72829 Elevated white blood cell count, unspecified: Secondary | ICD-10-CM | POA: Diagnosis not present

## 2018-01-31 DIAGNOSIS — Z91038 Other insect allergy status: Secondary | ICD-10-CM

## 2018-01-31 DIAGNOSIS — R198 Other specified symptoms and signs involving the digestive system and abdomen: Secondary | ICD-10-CM

## 2018-01-31 DIAGNOSIS — G4700 Insomnia, unspecified: Secondary | ICD-10-CM | POA: Diagnosis not present

## 2018-01-31 DIAGNOSIS — Z880 Allergy status to penicillin: Secondary | ICD-10-CM

## 2018-01-31 DIAGNOSIS — Z4659 Encounter for fitting and adjustment of other gastrointestinal appliance and device: Secondary | ICD-10-CM

## 2018-01-31 DIAGNOSIS — I452 Bifascicular block: Secondary | ICD-10-CM | POA: Diagnosis not present

## 2018-01-31 DIAGNOSIS — I700 Atherosclerosis of aorta: Secondary | ICD-10-CM | POA: Diagnosis present

## 2018-01-31 DIAGNOSIS — R1084 Generalized abdominal pain: Secondary | ICD-10-CM | POA: Diagnosis not present

## 2018-01-31 DIAGNOSIS — R7989 Other specified abnormal findings of blood chemistry: Secondary | ICD-10-CM | POA: Diagnosis not present

## 2018-01-31 DIAGNOSIS — I484 Atypical atrial flutter: Secondary | ICD-10-CM | POA: Diagnosis not present

## 2018-01-31 DIAGNOSIS — D6959 Other secondary thrombocytopenia: Secondary | ICD-10-CM | POA: Diagnosis present

## 2018-01-31 DIAGNOSIS — N433 Hydrocele, unspecified: Secondary | ICD-10-CM | POA: Diagnosis present

## 2018-01-31 DIAGNOSIS — E876 Hypokalemia: Secondary | ICD-10-CM | POA: Diagnosis not present

## 2018-01-31 DIAGNOSIS — R1900 Intra-abdominal and pelvic swelling, mass and lump, unspecified site: Secondary | ICD-10-CM | POA: Diagnosis not present

## 2018-01-31 DIAGNOSIS — R778 Other specified abnormalities of plasma proteins: Secondary | ICD-10-CM

## 2018-01-31 DIAGNOSIS — K5790 Diverticulosis of intestine, part unspecified, without perforation or abscess without bleeding: Secondary | ICD-10-CM | POA: Diagnosis not present

## 2018-01-31 DIAGNOSIS — E8770 Fluid overload, unspecified: Secondary | ICD-10-CM | POA: Diagnosis not present

## 2018-01-31 DIAGNOSIS — Z8371 Family history of colonic polyps: Secondary | ICD-10-CM | POA: Diagnosis not present

## 2018-01-31 DIAGNOSIS — R0602 Shortness of breath: Secondary | ICD-10-CM

## 2018-01-31 DIAGNOSIS — Z7189 Other specified counseling: Secondary | ICD-10-CM | POA: Diagnosis not present

## 2018-01-31 DIAGNOSIS — R109 Unspecified abdominal pain: Secondary | ICD-10-CM

## 2018-01-31 DIAGNOSIS — K261 Acute duodenal ulcer with perforation: Secondary | ICD-10-CM | POA: Diagnosis not present

## 2018-01-31 DIAGNOSIS — R140 Abdominal distension (gaseous): Secondary | ICD-10-CM | POA: Diagnosis not present

## 2018-01-31 DIAGNOSIS — R410 Disorientation, unspecified: Secondary | ICD-10-CM | POA: Diagnosis not present

## 2018-01-31 DIAGNOSIS — Z7401 Bed confinement status: Secondary | ICD-10-CM | POA: Diagnosis not present

## 2018-01-31 DIAGNOSIS — K275 Chronic or unspecified peptic ulcer, site unspecified, with perforation: Secondary | ICD-10-CM | POA: Diagnosis not present

## 2018-01-31 DIAGNOSIS — K6389 Other specified diseases of intestine: Secondary | ICD-10-CM | POA: Diagnosis not present

## 2018-01-31 DIAGNOSIS — T502X5A Adverse effect of carbonic-anhydrase inhibitors, benzothiadiazides and other diuretics, initial encounter: Secondary | ICD-10-CM | POA: Diagnosis not present

## 2018-01-31 DIAGNOSIS — K265 Chronic or unspecified duodenal ulcer with perforation: Secondary | ICD-10-CM

## 2018-01-31 DIAGNOSIS — K279 Peptic ulcer, site unspecified, unspecified as acute or chronic, without hemorrhage or perforation: Secondary | ICD-10-CM | POA: Diagnosis not present

## 2018-01-31 LAB — URINALYSIS, ROUTINE W REFLEX MICROSCOPIC
BILIRUBIN URINE: NEGATIVE
Bacteria, UA: NONE SEEN
Glucose, UA: NEGATIVE mg/dL
KETONES UR: NEGATIVE mg/dL
Leukocytes, UA: NEGATIVE
Nitrite: NEGATIVE
PROTEIN: NEGATIVE mg/dL
SPECIFIC GRAVITY, URINE: 1.039 — AB (ref 1.005–1.030)
pH: 5 (ref 5.0–8.0)

## 2018-01-31 LAB — COMPREHENSIVE METABOLIC PANEL
ALT: 90 U/L — ABNORMAL HIGH (ref 0–44)
AST: 70 U/L — ABNORMAL HIGH (ref 15–41)
Albumin: 3.4 g/dL — ABNORMAL LOW (ref 3.5–5.0)
Alkaline Phosphatase: 102 U/L (ref 38–126)
Anion gap: 14 (ref 5–15)
BILIRUBIN TOTAL: 1.1 mg/dL (ref 0.3–1.2)
BUN: 38 mg/dL — ABNORMAL HIGH (ref 8–23)
CO2: 21 mmol/L — ABNORMAL LOW (ref 22–32)
Calcium: 8.3 mg/dL — ABNORMAL LOW (ref 8.9–10.3)
Chloride: 98 mmol/L (ref 98–111)
Creatinine, Ser: 1.2 mg/dL (ref 0.61–1.24)
GFR calc Af Amer: >60 mL/min (ref >60)
GFR calc non Af Amer: 52 mL/min — ABNORMAL LOW (ref >60)
Glucose, Bld: 73 mg/dL (ref 70–99)
POTASSIUM: 3.6 mmol/L (ref 3.5–5.1)
Sodium: 133 mmol/L — ABNORMAL LOW (ref 135–145)
TOTAL PROTEIN: 6.2 g/dL — AB (ref 6.5–8.1)

## 2018-01-31 LAB — CBC WITH DIFFERENTIAL/PLATELET
ABS IMMATURE GRANULOCYTES: 0.17 10*3/uL — AB (ref 0.00–0.07)
BASOS PCT: 0 %
Basophils Absolute: 0 10*3/uL (ref 0.0–0.1)
Eosinophils Absolute: 0 10*3/uL (ref 0.0–0.5)
Eosinophils Relative: 0 %
HCT: 50.7 % (ref 39.0–52.0)
Hemoglobin: 16.7 g/dL (ref 13.0–17.0)
IMMATURE GRANULOCYTES: 2 %
Lymphocytes Relative: 3 %
Lymphs Abs: 0.3 10*3/uL — ABNORMAL LOW (ref 0.7–4.0)
MCH: 29.9 pg (ref 26.0–34.0)
MCHC: 32.9 g/dL (ref 30.0–36.0)
MCV: 90.7 fL (ref 80.0–100.0)
MONOS PCT: 2 %
Monocytes Absolute: 0.2 10*3/uL (ref 0.1–1.0)
NEUTROS ABS: 9.2 10*3/uL — AB (ref 1.7–7.7)
NEUTROS PCT: 93 %
Platelets: 180 10*3/uL (ref 150–400)
RBC: 5.59 MIL/uL (ref 4.22–5.81)
RDW: 13.9 % (ref 11.5–15.5)
WBC MORPHOLOGY: INCREASED
WBC: 9.9 10*3/uL (ref 4.0–10.5)
nRBC: 0 % (ref 0.0–0.2)

## 2018-01-31 LAB — TROPONIN I: Troponin I: 0.09 ng/mL (ref <0.03)

## 2018-01-31 LAB — LIPASE, BLOOD: Lipase: 59 U/L — ABNORMAL HIGH (ref 11–51)

## 2018-01-31 SURGERY — LAPAROSCOPY, DIAGNOSTIC
Anesthesia: General

## 2018-01-31 MED ORDER — METRONIDAZOLE IN NACL 5-0.79 MG/ML-% IV SOLN
500.0000 mg | Freq: Once | INTRAVENOUS | Status: AC
  Administered 2018-01-31: 500 mg via INTRAVENOUS
  Filled 2018-01-31: qty 100

## 2018-01-31 MED ORDER — FENTANYL CITRATE (PF) 100 MCG/2ML IJ SOLN
INTRAMUSCULAR | Status: AC
  Filled 2018-01-31: qty 2

## 2018-01-31 MED ORDER — PROPOFOL 10 MG/ML IV BOLUS
INTRAVENOUS | Status: AC
  Filled 2018-01-31: qty 20

## 2018-01-31 MED ORDER — SODIUM CHLORIDE 0.9 % IV SOLN
INTRAVENOUS | Status: AC
  Administered 2018-02-01: 02:00:00 via INTRAVENOUS

## 2018-01-31 MED ORDER — SODIUM CHLORIDE 0.9 % IV BOLUS
1000.0000 mL | Freq: Once | INTRAVENOUS | Status: AC
  Administered 2018-02-01: 1000 mL via INTRAVENOUS

## 2018-01-31 MED ORDER — FENTANYL CITRATE (PF) 100 MCG/2ML IJ SOLN
50.0000 ug | Freq: Once | INTRAMUSCULAR | Status: AC
  Administered 2018-01-31: 50 ug via INTRAVENOUS
  Filled 2018-01-31: qty 2

## 2018-01-31 MED ORDER — IOPAMIDOL (ISOVUE-300) INJECTION 61%
INTRAVENOUS | Status: AC
  Filled 2018-01-31: qty 100

## 2018-01-31 MED ORDER — SODIUM CHLORIDE 0.9 % IV SOLN
80.0000 mg | Freq: Once | INTRAVENOUS | Status: AC
  Administered 2018-02-01: 80 mg via INTRAVENOUS
  Filled 2018-01-31: qty 80

## 2018-01-31 MED ORDER — LIDOCAINE 2% (20 MG/ML) 5 ML SYRINGE
INTRAMUSCULAR | Status: AC
  Filled 2018-01-31: qty 5

## 2018-01-31 MED ORDER — BUPIVACAINE-EPINEPHRINE (PF) 0.25% -1:200000 IJ SOLN
INTRAMUSCULAR | Status: AC
  Filled 2018-01-31: qty 30

## 2018-01-31 MED ORDER — IPRATROPIUM-ALBUTEROL 0.5-2.5 (3) MG/3ML IN SOLN
3.0000 mL | Freq: Four times a day (QID) | RESPIRATORY_TRACT | Status: DC | PRN
  Administered 2018-02-03 – 2018-02-08 (×5): 3 mL via RESPIRATORY_TRACT
  Filled 2018-01-31 (×5): qty 3

## 2018-01-31 MED ORDER — MORPHINE SULFATE (PF) 2 MG/ML IV SOLN: 1.0000 mg | INTRAVENOUS | Status: DC | PRN

## 2018-01-31 MED ORDER — PHENYLEPHRINE HCL 10 MG/ML IJ SOLN
INTRAMUSCULAR | Status: AC
  Filled 2018-01-31: qty 1

## 2018-01-31 MED ORDER — SODIUM CHLORIDE 0.9 % IV BOLUS
1000.0000 mL | Freq: Once | INTRAVENOUS | Status: AC
  Administered 2018-01-31: 1000 mL via INTRAVENOUS

## 2018-01-31 MED ORDER — IOPAMIDOL (ISOVUE-300) INJECTION 61%
100.0000 mL | Freq: Once | INTRAVENOUS | Status: AC | PRN
  Administered 2018-01-31: 100 mL via INTRAVENOUS

## 2018-01-31 MED ORDER — CIPROFLOXACIN IN D5W 400 MG/200ML IV SOLN
400.0000 mg | Freq: Once | INTRAVENOUS | Status: AC
  Administered 2018-02-01: 400 mg via INTRAVENOUS
  Filled 2018-01-31: qty 200

## 2018-01-31 MED ORDER — SODIUM CHLORIDE 0.9 % IV BOLUS
500.0000 mL | Freq: Once | INTRAVENOUS | Status: AC
  Administered 2018-01-31: 500 mL via INTRAVENOUS

## 2018-01-31 MED ORDER — METRONIDAZOLE IN NACL 5-0.79 MG/ML-% IV SOLN
500.0000 mg | Freq: Three times a day (TID) | INTRAVENOUS | Status: DC
  Administered 2018-02-01 – 2018-02-07 (×20): 500 mg via INTRAVENOUS
  Filled 2018-01-31 (×20): qty 100

## 2018-01-31 MED ORDER — ONDANSETRON HCL 4 MG/2ML IJ SOLN
4.0000 mg | Freq: Four times a day (QID) | INTRAMUSCULAR | Status: DC | PRN
  Administered 2018-02-09: 4 mg via INTRAVENOUS
  Filled 2018-01-31: qty 2

## 2018-01-31 MED ORDER — SUCRALFATE 1 GM/10ML PO SUSP
1.0000 g | Freq: Four times a day (QID) | ORAL | Status: DC
  Administered 2018-02-02 – 2018-02-09 (×20): 1 g via ORAL
  Filled 2018-01-31 (×21): qty 10

## 2018-01-31 MED ORDER — ACETAMINOPHEN 650 MG RE SUPP: 650.0000 mg | Freq: Four times a day (QID) | RECTAL | Status: DC | PRN

## 2018-01-31 MED ORDER — SODIUM CHLORIDE 0.9 % IV SOLN
8.0000 mg/h | INTRAVENOUS | Status: AC
  Administered 2018-02-01 – 2018-02-03 (×4): 8 mg/h via INTRAVENOUS
  Filled 2018-01-31 (×12): qty 80

## 2018-01-31 MED ORDER — DEXAMETHASONE SODIUM PHOSPHATE 10 MG/ML IJ SOLN
INTRAMUSCULAR | Status: AC
  Filled 2018-01-31: qty 1

## 2018-01-31 MED ORDER — SUCCINYLCHOLINE CHLORIDE 200 MG/10ML IV SOSY
PREFILLED_SYRINGE | INTRAVENOUS | Status: AC
  Filled 2018-01-31: qty 10

## 2018-01-31 MED ORDER — ACETAMINOPHEN 325 MG PO TABS
650.0000 mg | ORAL_TABLET | Freq: Four times a day (QID) | ORAL | Status: DC | PRN
  Administered 2018-02-09 (×2): 650 mg via ORAL
  Filled 2018-01-31 (×3): qty 2

## 2018-01-31 MED ORDER — ONDANSETRON HCL 4 MG/2ML IJ SOLN
4.0000 mg | Freq: Once | INTRAMUSCULAR | Status: AC
  Administered 2018-01-31: 4 mg via INTRAVENOUS
  Filled 2018-01-31: qty 2

## 2018-01-31 MED ORDER — PANTOPRAZOLE SODIUM 40 MG IV SOLR
40.0000 mg | Freq: Two times a day (BID) | INTRAVENOUS | Status: DC
  Administered 2018-02-04 – 2018-02-10 (×14): 40 mg via INTRAVENOUS
  Filled 2018-01-31 (×14): qty 40

## 2018-01-31 MED ORDER — SODIUM CHLORIDE (PF) 0.9 % IJ SOLN
INTRAMUSCULAR | Status: AC
  Filled 2018-01-31: qty 50

## 2018-01-31 MED ORDER — ONDANSETRON HCL 4 MG/2ML IJ SOLN
INTRAMUSCULAR | Status: AC
  Filled 2018-01-31: qty 2

## 2018-01-31 MED ORDER — ROCURONIUM BROMIDE 100 MG/10ML IV SOLN
INTRAVENOUS | Status: AC
  Filled 2018-01-31: qty 1

## 2018-01-31 NOTE — ED Notes (Signed)
Patient transported to CT 

## 2018-01-31 NOTE — ED Provider Notes (Signed)
[facility]-EMERGENCY DEPT Provider Note   CSN: [id number] Arrival date & time: 01/31/18  1626     History   Chief Complaint Chief Complaint  Patient presents with  . Abdominal Pain    HPI Manuel Reilly is a 84 y.o. male.  HPI  84 year old male presents with abdominal pain.  Started within the last few days, though he cannot member exactly what day it started.  The abdominal pain is diffuse.  He rates it as a 10/10.  It seems to get worse with minimal palpation to his abdomen.  He has not had any nausea or vomiting up until now in the emergency department and he now is complaining of nausea.  There is no chest pain or back pain.  No urinary symptoms.  He states that he took Pepto-Bismol on a laxative and had loose bowel movements but this did not improve his pain.  Otherwise he does not take anything for pain.  Past Medical History:  Diagnosis Date  . Allergy   . Anxiety   . Diverticulosis   . History of colon polyps   . History of hemorrhoids   . History of inguinal herniorrhaphy    right  . Hyperlipidemia   . Hypogonadism, male   . Sciatica    right    Patient Active Problem List   Diagnosis Date Noted  . Perforated peptic ulcer (HCC) 01/31/2018  . Elevated troponin 01/31/2018  . Pleural effusion 01/31/2018  . Insomnia 09/20/2017  . Venous insufficiency 09/20/2017  . Greater trochanteric bursitis of right hip 03/05/2015  . Esophageal achalasia 11/13/2014  . Obstructive chronic bronchitis without exacerbation (HCC) 11/26/2012  . Hyperlipidemia 07/19/2007  . Elevated blood pressure (not hypertension) 07/19/2007    Past Surgical History:  Procedure Laterality Date  . CARPAL TUNNEL RELEASE    . HERNIA REPAIR          Home Medications    Prior to Admission medications   Medication Sig Start Date End Date Taking? Authorizing Provider  acetaminophen (TYLENOL) 500 MG tablet Take 500 mg by mouth every 6 (six) hours as needed for mild  pain, moderate pain, fever or headache.   Yes [provider]  albuterol (PROVENTIL HFA;VENTOLIN HFA) 108 (90 Base) MCG/ACT inhaler Inhale 2 puffs into the lungs every 6 (six) hours as needed for wheezing or shortness of breath.   Yes [provider]  ALPRAZolam (XANAX) 0.25 MG tablet Take 1 tablet (0.25 mg total) by mouth at bedtime as needed. Needs visit for refill Patient taking differently: Take 0.25 mg by mouth at bedtime as needed for anxiety. Needs visit for refill 09/19/17   Myrlene Brokerrawford, Elizabeth A, MD  pantoprazole (PROTONIX) 20 MG tablet Take 1 tablet (20 mg total) by mouth daily. Patient not taking: Reported on 01/31/2018 06/05/17   Tilden Fossaees, Elizabeth, MD    Family History Family History  Problem Relation Age of Onset  . Cancer Mother        colon  . Cancer Father        prostate  . Colon polyps Brother   . Diabetes Brother   . Heart disease Brother     Social History Social History   Tobacco Use  . Smoking status: Never Smoker  . Smokeless tobacco: Never Used  Substance Use Topics  . Alcohol use: Yes    Comment: occasionally  . Drug use: No     Allergies   Penicillins; Procaine hcl; and Wasp venom   Review of  Systems Review of Systems  Constitutional: Negative for fever.  Cardiovascular: Negative for chest pain.  Gastrointestinal: Positive for abdominal pain and nausea. Negative for constipation, diarrhea and vomiting.  Genitourinary: Negative for dysuria.  Musculoskeletal: Negative for back pain.  All other systems reviewed and are negative.    Physical Exam Updated Vital Signs BP 92/60   Pulse (!) 123   Temp 98 F (36.7 C) (Oral)   Resp (!) 21   SpO2 91%   Physical Exam  Constitutional: He appears well-developed and well-nourished.  HENT:  Head: Normocephalic and atraumatic.  Right Ear: External ear normal.  Left Ear: External ear normal.  Nose: Nose normal.  Eyes: Right eye exhibits no discharge. Left eye exhibits no discharge.    Neck: Neck supple.  Cardiovascular: Normal rate, regular rhythm and normal heart sounds.  Pulmonary/Chest: Effort normal and breath sounds normal.  Abdominal: Soft. There is generalized tenderness. There is guarding.  Genitourinary:  Genitourinary Comments: No gross blood on DRE. No fecal impaction.  Musculoskeletal: He exhibits no edema.  Neurological: He is alert.  Skin: Skin is warm and dry.  Psychiatric: His mood appears not anxious.  Nursing note and vitals reviewed.    ED Treatments / Results  Labs (all labs ordered are listed, but only abnormal results are displayed) Labs Reviewed  LIPASE, BLOOD - Abnormal; Notable for the following components:      Result Value   Lipase 59 (*)    All other components within normal limits  COMPREHENSIVE METABOLIC PANEL - Abnormal; Notable for the following components:   Sodium 133 (*)    CO2 21 (*)    BUN 38 (*)    Calcium 8.3 (*)    Total Protein 6.2 (*)    Albumin 3.4 (*)    AST 70 (*)    ALT 90 (*)    GFR calc non Af Amer 52 (*)    All other components within normal limits  CBC WITH DIFFERENTIAL/PLATELET - Abnormal; Notable for the following components:   Neutro Abs 9.2 (*)    Lymphs Abs 0.3 (*)    Abs Immature Granulocytes 0.17 (*)    All other components within normal limits  TROPONIN I - Abnormal; Notable for the following components:   Troponin I 0.09 (*)    All other components within normal limits  URINALYSIS, ROUTINE W REFLEX MICROSCOPIC  TROPONIN I  TROPONIN I  TROPONIN I  CBC  CBC  CBC    EKG EKG Interpretation  Date/Time:  Wednesday January 31 2018 19:11:43 EST Ventricular Rate:  100 PR Interval:    QRS Duration: 141 QT Interval:  377 QTC Calculation: 487 R Axis:   -51 Text Interpretation:  Junctional tachycardia RBBB and LAFB rate is faster compared to July 2019 Confirmed by Pricilla Loveless [phone]) on 01/31/2018 7:15:42 PM   Radiology Dg Chest 2 View  Result Date: 01/31/2018 CLINICAL DATA:   Upper abdominal pain EXAM: CHEST - 2 VIEW COMPARISON:  09/09/2017 FINDINGS: Small pleural effusions. No focal consolidation. Scarring in the right lower lobe. Stable cardiomediastinal silhouette. No pneumothorax. Lucency in the left upper quadrant thought to reflect gas within the gastric fundus. IMPRESSION: Small bilateral pleural effusions Electronically Signed   By: Jasmine Pang M.D.   On: 01/31/2018 18:03   Ct Abdomen Pelvis W Contrast  Result Date: 01/31/2018 CLINICAL DATA:  84 year old with acute abdominal pain. EXAM: CT ABDOMEN AND PELVIS WITH CONTRAST TECHNIQUE: Multidetector CT imaging of the abdomen and pelvis was performed using  the standard protocol following bolus administration of intravenous contrast. CONTRAST:  ISOVUE-300 IOPAMIDOL (ISOVUE-300) INJECTION 61% COMPARISON:  CT 09/09/2017 FINDINGS: Lower chest: Unchanged ovoid density in the left lower lobe consistent with round atelectasis. Bibasilar scarring. Subpleural bleb in the right lower lobe small right pleural effusion is new. There are coronary artery calcifications. Hepatobiliary: 15 mm cyst in the left lobe of the liver. Minimal gallbladder wall thickening and adjacent stranding is likely reactive secondary to gastric/duodenal inflammation, similar to slightly improved from prior exam. No calcified gallstone. No biliary dilatation. Pancreas: Fatty atrophy.  No ductal dilatation or inflammation. Spleen: Normal in size without focal abnormality. Adrenals/Urinary Tract: Normal adrenal glands. No hydronephrosis or perinephric edema. Homogeneous renal enhancement. Absent renal excretion on delayed phase imaging suggesting underlying renal dysfunction. Symmetric age-related renal atrophy. 12 mm cyst from the lower right kidney. Urinary bladder is physiologically distended without wall thickening. Stomach/Bowel: Stomach distended with air-fluid level. Peri pyloric wall thickening of the distal stomach and duodenum with mucosal  enhancement and perigastric/peri duodenal inflammation. Small foci of extraluminal air in the porta hepatis consistent perforation. Regional stranding and free fluid without organized abscess. Duodenal diverticulum about the fourth portion again seen without inflammation. Remaining small bowel is unremarkable. No obstruction. Mild distal descending and sigmoid diverticulosis without diverticulitis. Appendix is not confidently visualized. Vascular/Lymphatic: Aorta bi-iliac atherosclerosis. No aneurysm. Circumaortic left renal vein. No adenopathy. Reproductive: Prostatic calcifications. Other: Foci of air in the right upper quadrant of the abdomen in the porta hepatis consistent with bowel perforation. Right upper quadrant inflammatory change and free fluid without organized abscess. Small amount of free fluid tracks into the pelvis. Left inguinal canal contains fat and small amount of free fluid. Prior right inguinal hernia repair. Right scrotal hydroceles partially included. Musculoskeletal: Multilevel degenerative change throughout spine. Unchanged grade 1 retrolisthesis of L3 on L4, degenerative. There are no acute or suspicious osseous abnormalities. IMPRESSION: 1. Findings highly suspicious for perforated peptic ulcer with small foci of extraluminal air in the porta hepatis and right upper quadrant, wall thickening of the peri pyloric stomach and proximal duodenum. No organized abscess. 2. Mild gallbladder wall thickening and adjacent stranding is likely reactive secondary to gastric/duodenal inflammation, similar to slightly improved from prior exam. 3. Colonic diverticulosis without diverticulitis. 4. Absent renal excretion on delayed phase imaging consistent with underlying renal dysfunction. 5. Small right pleural effusion. 6. Emphysema at the lung bases with round atelectasis in the left lower lobe. Emphysema (ICD10-J43.9). 7.  Aortic Atherosclerosis (ICD10-I70.0). Critical Value/emergent results were  called by telephone at the time of interpretation on 01/31/2018 at 9:22 pm to Dr. Pricilla Loveless , who verbally acknowledged these results. Electronically Signed   By: Narda Rutherford M.D.   On: 01/31/2018 21:24    Procedures .Critical Care Performed by: Pricilla Loveless, MD Authorized by: Pricilla Loveless, MD   Critical care provider statement:    Critical care time (minutes):  30   Critical care time was exclusive of:  Separately billable procedures and treating other patients   Critical care was necessary to treat or prevent imminent or life-threatening deterioration of the following conditions:  Shock   Critical care was time spent personally by me on the following activities:  Development of treatment plan with patient or surrogate, discussions with consultants, evaluation of patient's response to treatment, examination of patient, obtaining history from patient or surrogate, ordering and performing treatments and interventions, ordering and review of laboratory studies, ordering and review of radiographic studies, pulse oximetry, re-evaluation of  patient's condition and review of old charts   (including critical care time)  Medications Ordered in ED Medications  iopamidol (ISOVUE-300) 61 % injection (has no administration in time range)  sodium chloride (PF) 0.9 % injection (has no administration in time range)  ciprofloxacin (CIPRO) IVPB 400 mg (has no administration in time range)  metroNIDAZOLE (FLAGYL) IVPB 500 mg (500 mg Intravenous New Bag/Given 01/31/18 2247)  metroNIDAZOLE (FLAGYL) IVPB 500 mg (has no administration in time range)  morphine 2 MG/ML injection 1 mg (has no administration in time range)  ondansetron (ZOFRAN) injection 4 mg (has no administration in time range)  sodium chloride 0.9 % bolus 1,000 mL (has no administration in time range)  0.9 %  sodium chloride infusion (has no administration in time range)  pantoprazole (PROTONIX) 80 mg in sodium chloride 0.9 % 100  mL IVPB (has no administration in time range)  pantoprazole (PROTONIX) 80 mg in sodium chloride 0.9 % 250 mL (0.32 mg/mL) infusion (has no administration in time range)  pantoprazole (PROTONIX) injection 40 mg (has no administration in time range)  sucralfate (CARAFATE) 1 GM/10ML suspension 1 g (has no administration in time range)  acetaminophen (TYLENOL) tablet 650 mg (has no administration in time range)    Or  acetaminophen (TYLENOL) suppository 650 mg (has no administration in time range)  fentaNYL (SUBLIMAZE) injection 50 mcg (50 mcg Intravenous Given 01/31/18 1848)  ondansetron (ZOFRAN) injection 4 mg (4 mg Intravenous Given 01/31/18 1848)  sodium chloride 0.9 % bolus 500 mL (500 mLs Intravenous New Bag/Given 01/31/18 1847)  iopamidol (ISOVUE-300) 61 % injection 100 mL (100 mLs Intravenous Contrast Given 01/31/18 2059)  fentaNYL (SUBLIMAZE) injection 50 mcg (50 mcg Intravenous Given 01/31/18 2054)  sodium chloride 0.9 % bolus 1,000 mL (1,000 mLs Intravenous New Bag/Given 01/31/18 2242)     Initial Impression / Assessment and Plan / ED Course  I have reviewed the triage vital signs and the nursing notes.  Pertinent labs & imaging results that were available during my care of the patient were reviewed by me and considered in my medical decision making (see chart for details).     Patient CT scan was initially delayed due to a delay in his CMP and lipase resulting.  However the CT scan now shows what appears to be contained perforated peptic ulcer.  He was given intra-abdominal antibiotics and IV fluids.  His pain is much better after IV fentanyl.  I discussed with Dr. Ezzard Standing who was consulted on the patient.  Hospitalist has also been consulted at his request.  He will be admitted to the stepdown unit.    Final Clinical Impressions(s) / ED Diagnoses   Final diagnoses:  Perforated peptic ulcer [facility])    ED Discharge Orders    None       Pricilla Loveless, MD 01/31/18 2344

## 2018-01-31 NOTE — ED Notes (Signed)
Date and time results received: 01/31/18 1942 (use smartphrase ".now" to insert current time)  Test: troponin  Critical Value: 0.09  Name of Provider Notified: Dr.Scott Goldston  Orders Received? Or Actions Taken?: Actions Taken: notified Dr Pricilla LovelessScott Goldston

## 2018-01-31 NOTE — H&P (Addendum)
History and Physical    Manuel Reilly TMH:[id number] DOB: 01/13/1924 DOA: 01/31/2018  PCP: Hoyt Koch, MD Patient coming from: Home  Chief Complaint: Abdominal pain  HPI: Manuel Reilly is a 84 y.o. male with medical history significant of diverticulosis, inguinal hernia repair, colon polyps, hemorrhoids, hypertension, COPD, anxiety presenting to the hospital for evaluation of abdominal pain.  Patient reports having intermittent abdominal pain for the past 6 months.  States his abdominal pain has been worse since yesterday and is now 9-10 out of 10 in intensity.  The pain is generalized.  He is not able to describe the quality of the pain.  Pain is worse after eating.  Also reports having chills.  Denies having any nausea or vomiting.  States he has been having loose stools after eating pizza 2 days ago.  He uses Tylenol occasionally for headaches.  Denies any NSAID use such as ibuprofen, Advil, Aleve, naproxen, Goody powder, BC powder etc. States he was diagnosed with high blood pressure in the past but has not been on any medications for a long while.  Denies having any chest pain or shortness of breath.    ED Course: Afebrile, tachycardic, normal respiratory rate, low diastolic blood pressure, and satting well on room air.  No leukocytosis.  Lipase mildly elevated at 59.  Transaminases elevated (AST 70, ALT 90).  Alk phos and T bili normal.  Hemoglobin 16.7.  UA pending.  Troponin I 0.09.  EKG not suggestive of ACS.   Chest x-ray showing small bilateral pleural effusions; no focal consolidation. CT abdomen pelvis with contrast showing findings highly suspicious for perforated peptic ulcer with small foci of extraluminal air in the porta hepatis and right upper quadrant, wall thickening of the peri pyloric stomach and proximal duodenum. No organized abscess. Mild gallbladder wall thickening and adjacent stranding likely reactive secondary to gastric/duodenal inflammation, similar  to slightly improved from prior exam. Colonic diverticulosis without diverticulitis. Absent renal excretion on delayed phase imaging consistent with underlying renal dysfunction. Small right pleural effusion. Emphysema at the lung bases with round atelectasis in the left lower lobe.  Patient received 2 doses of IV fentanyl 50 mcg, IV Zofran 4 mg, ciprofloxacin, metronidazole, and a 500 cc bolus of fluid in the ED.  Another 1 L bolus of fluid has been ordered.  Review of Systems: As per HPI otherwise 10 point review of systems negative.  Past Medical History:  Diagnosis Date  . Allergy   . Anxiety   . Diverticulosis   . History of colon polyps   . History of hemorrhoids   . History of inguinal herniorrhaphy    right  . Hyperlipidemia   . Hypogonadism, male   . Sciatica    right    Past Surgical History:  Procedure Laterality Date  . CARPAL TUNNEL RELEASE    . HERNIA REPAIR       reports that he has never smoked. He has never used smokeless tobacco. He reports that he drinks alcohol. He reports that he does not use drugs.  Allergies  Allergen Reactions  . Penicillins Hives, Shortness Of Breath and Rash    Has patient had a PCN reaction causing immediate rash, facial/tongue/throat swelling, SOB or lightheadedness with hypotension: Yes Has patient had a PCN reaction causing severe rash involving mucus membranes or skin necrosis: Yes Has patient had a PCN reaction that required hospitalization No Has patient had a PCN reaction occurring within the last 10 years: No If all of  the above answers are "NO", then may proceed with Cephalosporin use.   . Procaine Hcl Anaphylaxis and Swelling  . Wasp Venom Swelling    Swelling of throat and swelling at the site     Family History  Problem Relation Age of Onset  . Cancer Mother        colon  . Cancer Father        prostate  . Colon polyps Brother   . Diabetes Brother   . Heart disease Brother     Prior to Admission medications    Medication Sig Start Date End Date Taking? Authorizing Provider  acetaminophen (TYLENOL) 500 MG tablet Take 500 mg by mouth every 6 (six) hours as needed for mild pain, moderate pain, fever or headache.   Yes [provider]  albuterol (PROVENTIL HFA;VENTOLIN HFA) 108 (90 Base) MCG/ACT inhaler Inhale 2 puffs into the lungs every 6 (six) hours as needed for wheezing or shortness of breath.   Yes [provider]  ALPRAZolam (XANAX) 0.25 MG tablet Take 1 tablet (0.25 mg total) by mouth at bedtime as needed. Needs visit for refill Patient taking differently: Take 0.25 mg by mouth at bedtime as needed for anxiety. Needs visit for refill 09/19/17   Hoyt Koch, MD  pantoprazole (PROTONIX) 20 MG tablet Take 1 tablet (20 mg total) by mouth daily. Patient not taking: Reported on 01/31/2018 06/05/17   Quintella Reichert, MD    Physical Exam: Vitals:   01/31/18 2000 01/31/18 2030 01/31/18 2143 01/31/18 2200  BP: (!) 104/54 (!) 109/52 (!) 105/50 92/60  Pulse: (!) 103 (!) 105 (!) 128 (!) 123  Resp: 20 (!) 24 18 (!) 21  Temp:      TempSrc:      SpO2: 91% 91% 94% 91%    Physical Exam  Constitutional: He is oriented to person, place, and time. He appears distressed.  Lying in the left lateral decubitus position  HENT:  Head: Normocephalic.  Dry mucous membranes  Eyes: Right eye exhibits no discharge. Left eye exhibits no discharge.  Neck: Neck supple.  Cardiovascular: Normal rate, regular rhythm and intact distal pulses.  Pulmonary/Chest: Effort normal and breath sounds normal. No respiratory distress. He has no wheezes. He has no rales.  Abdominal: Bowel sounds are normal. He exhibits distension. There is tenderness.  Generalized tenderness on minimal palpation  Musculoskeletal: He exhibits no edema.  Neurological: He is alert and oriented to person, place, and time.  Skin: Skin is warm and dry. He is not diaphoretic.  Psychiatric: He has a normal mood and affect. His  behavior is normal.     Labs on Admission: I have personally reviewed following labs and imaging studies  CBC: Recent Labs  Lab 01/31/18 1708  WBC 9.9  NEUTROABS 9.2*  HGB 16.7  HCT 50.7  MCV 90.7  PLT 595   Basic Metabolic Panel: Recent Labs  Lab 01/31/18 1708  NA 133*  K 3.6  CL 98  CO2 21*  GLUCOSE 73  BUN 38*  CREATININE 1.20  CALCIUM 8.3*   GFR: CrCl cannot be calculated (Unknown ideal weight.). Liver Function Tests: Recent Labs  Lab 01/31/18 1708  AST 70*  ALT 90*  ALKPHOS 102  BILITOT 1.1  PROT 6.2*  ALBUMIN 3.4*   Recent Labs  Lab 01/31/18 1708  LIPASE 59*   No results for input(s): AMMONIA in the last 168 hours. Coagulation Profile: No results for input(s): INR, PROTIME in the last 168 hours. Cardiac Enzymes:  Recent Labs  Lab 01/31/18 1708  TROPONINI 0.09*   BNP (last 3 results) No results for input(s): PROBNP in the last 8760 hours. HbA1C: No results for input(s): HGBA1C in the last 72 hours. CBG: No results for input(s): GLUCAP in the last 168 hours. Lipid Profile: No results for input(s): CHOL, HDL, LDLCALC, TRIG, CHOLHDL, LDLDIRECT in the last 72 hours. Thyroid Function Tests: No results for input(s): TSH, T4TOTAL, FREET4, T3FREE, THYROIDAB in the last 72 hours. Anemia Panel: No results for input(s): VITAMINB12, FOLATE, FERRITIN, TIBC, IRON, RETICCTPCT in the last 72 hours. Urine analysis:    Component Value Date/Time   COLORURINE YELLOW 01/31/2018 2315   APPEARANCEUR CLEAR 01/31/2018 2315   LABSPEC 1.039 (H) 01/31/2018 2315   PHURINE 5.0 01/31/2018 2315   GLUCOSEU NEGATIVE 01/31/2018 2315   HGBUR SMALL (A) 01/31/2018 2315   BILIRUBINUR NEGATIVE 01/31/2018 2315   KETONESUR NEGATIVE 01/31/2018 2315   PROTEINUR NEGATIVE 01/31/2018 2315   NITRITE NEGATIVE 01/31/2018 2315   LEUKOCYTESUR NEGATIVE 01/31/2018 2315    Radiological Exams on Admission: Dg Chest 2 View  Result Date: 01/31/2018 CLINICAL DATA:  Upper abdominal  pain EXAM: CHEST - 2 VIEW COMPARISON:  09/09/2017 FINDINGS: Small pleural effusions. No focal consolidation. Scarring in the right lower lobe. Stable cardiomediastinal silhouette. No pneumothorax. Lucency in the left upper quadrant thought to reflect gas within the gastric fundus. IMPRESSION: Small bilateral pleural effusions Electronically Signed   By: Donavan Foil M.D.   On: 01/31/2018 18:03   Ct Abdomen Pelvis W Contrast  Result Date: 01/31/2018 CLINICAL DATA:  84 year old with acute abdominal pain. EXAM: CT ABDOMEN AND PELVIS WITH CONTRAST TECHNIQUE: Multidetector CT imaging of the abdomen and pelvis was performed using the standard protocol following bolus administration of intravenous contrast. CONTRAST:  137m ISOVUE-300 IOPAMIDOL (ISOVUE-300) INJECTION 61% COMPARISON:  CT 09/09/2017 FINDINGS: Lower chest: Unchanged ovoid density in the left lower lobe consistent with round atelectasis. Bibasilar scarring. Subpleural bleb in the right lower lobe small right pleural effusion is new. There are coronary artery calcifications. Hepatobiliary: 15 mm cyst in the left lobe of the liver. Minimal gallbladder wall thickening and adjacent stranding is likely reactive secondary to gastric/duodenal inflammation, similar to slightly improved from prior exam. No calcified gallstone. No biliary dilatation. Pancreas: Fatty atrophy.  No ductal dilatation or inflammation. Spleen: Normal in size without focal abnormality. Adrenals/Urinary Tract: Normal adrenal glands. No hydronephrosis or perinephric edema. Homogeneous renal enhancement. Absent renal excretion on delayed phase imaging suggesting underlying renal dysfunction. Symmetric age-related renal atrophy. 12 mm cyst from the lower right kidney. Urinary bladder is physiologically distended without wall thickening. Stomach/Bowel: Stomach distended with air-fluid level. Peri pyloric wall thickening of the distal stomach and duodenum with mucosal enhancement and  perigastric/peri duodenal inflammation. Small foci of extraluminal air in the porta hepatis consistent perforation. Regional stranding and free fluid without organized abscess. Duodenal diverticulum about the fourth portion again seen without inflammation. Remaining small bowel is unremarkable. No obstruction. Mild distal descending and sigmoid diverticulosis without diverticulitis. Appendix is not confidently visualized. Vascular/Lymphatic: Aorta bi-iliac atherosclerosis. No aneurysm. Circumaortic left renal vein. No adenopathy. Reproductive: Prostatic calcifications. Other: Foci of air in the right upper quadrant of the abdomen in the porta hepatis consistent with bowel perforation. Right upper quadrant inflammatory change and free fluid without organized abscess. Small amount of free fluid tracks into the pelvis. Left inguinal canal contains fat and small amount of free fluid. Prior right inguinal hernia repair. Right scrotal hydroceles partially included. Musculoskeletal: Multilevel degenerative change  throughout spine. Unchanged grade 1 retrolisthesis of L3 on L4, degenerative. There are no acute or suspicious osseous abnormalities. IMPRESSION: 1. Findings highly suspicious for perforated peptic ulcer with small foci of extraluminal air in the porta hepatis and right upper quadrant, wall thickening of the peri pyloric stomach and proximal duodenum. No organized abscess. 2. Mild gallbladder wall thickening and adjacent stranding is likely reactive secondary to gastric/duodenal inflammation, similar to slightly improved from prior exam. 3. Colonic diverticulosis without diverticulitis. 4. Absent renal excretion on delayed phase imaging consistent with underlying renal dysfunction. 5. Small right pleural effusion. 6. Emphysema at the lung bases with round atelectasis in the left lower lobe. Emphysema (ICD10-J43.9). 7.  Aortic Atherosclerosis (ICD10-I70.0). Critical Value/emergent results were called by telephone  at the time of interpretation on 01/31/2018 at 9:22 pm to Dr. Sherwood Gambler , who verbally acknowledged these results. Electronically Signed   By: Keith Rake M.D.   On: 01/31/2018 21:24    EKG: Independently reviewed.  Junctional tachycardia (heart rate 100). Right bundle branch block and left anterior fascicular block similar to prior tracing.  Poor quality study with baseline wander.  Assessment/Plan Principal Problem:   Perforated peptic ulcer ([place]) Active Problems:   COPD (chronic obstructive pulmonary disease) ([facility])   Elevated troponin   Pleural effusion   HTN (hypertension)   Perforated peptic ulcer -Afebrile and no leukocytosis.  Lipase mildly elevated at 59.  Transaminases elevated (AST 70, ALT 90).  Alk phos and T bili normal.  Hemoglobin stable on admission at 16.7.     -CT abdomen pelvis with contrast showing findings highly suspicious for perforated peptic ulcer with small foci of extraluminal air in the porta hepatis and right upper quadrant, wall thickening of the peri pyloric stomach and proximal duodenum. No organized abscess. Mild gallbladder wall thickening and adjacent stranding likely reactive secondary to gastric/duodenal inflammation, similar to slightly improved from prior exam.   -Per chart review, abdominal CT done in July 2019 suggested duodenitis/peptic ulcer disease.  It seems patient has not been on any treatment. -Patient was seen by Dr. Lucia Gaskins from general surgery.  Plan is to observe the patient at this time.  Dr. Lucia Gaskins recommended NG tube, IV fluid resuscitation, IV PPI, and Carafate.  Also recommended nonurgent GI consultation later during this hospitalization as patient will need follow-up after discharge.  Recommended repeating abdominal x-ray in the morning and repeating abdominal CT in 3 to 5 days. -Tachycardic with HR in the 120s, BP soft (most recent MAP in the 60s), however, previously noted to have MAPs as low as 50s. I spoke to [employer].   Waiting to hear back from critical care team. -Keep n.p.o. -Type and screen -2 large-bore IVs -Continue aggressive IV fluid resuscitation. Goal MAP >65 -Antibiotic coverage with ciprofloxacin and metronidazole -NG tube per surgery recommendation -IV PPI bolus and continuous infusion -Carafate 1 g every 6 hours -IV Zofran PRN nausea -IV morphine 1 mg every 3 hours as needed for pain -Serial CBCs every 4 hours -Repeat abdominal x-ray tomorrow morning -Patient will need repeat abdominal CT in 3 to 5 days -Monitor in the stepdown unit  Troponinemia Patient denies having any chest pain or shortness of breath. Troponin I 0.09.  EKG not suggestive of ACS. -Continue to trend troponin -Monitor on telemetry  Pleural effusion on CT Chest x-ray showing small bilateral pleural effusions; no focal consolidation. CT showing small right pleural effusion. Emphysema at the lung bases with round atelectasis in the left lower lobe.  Patient is not complaining of shortness of breath.  Satting well on room air. -Continue to monitor  HTN Patient is not on home medications.  Blood pressure currently soft. -Do not give any antihypertensives at this time  COPD -Stable.  No bronchospasm.  Currently just on albuterol as needed at home. -Duo nebs every 6 hours as needed for wheezing/ SOB  ADDENDUM 11/28 am: Paged by nursing staff this morning as patient was noted to be in atrial fibrillation with heart rate in the 120s.  Patient was seen at bedside.  Stat EKG was done and revealed atrial fibrillation with heart rate 115.  Blood pressure continues to be soft with MAP in the low 60s after patient receiving aggressive IV fluid resuscitation.  As such, will hold off starting calcium channel blocker or beta-blocker at this time.  Called cardiology fellow Dr. Georgette Shell twice but was informed by her that she was busy both times.  Called attending Dr. Debara Pickett to discuss the case and see if amiodarone is an appropriate  option.  Dr. Debara Pickett will review the patient's EKGs and call back. -TSH has been ordered -Echocardiogram  Addendum 6:57 am: Since patient cannot be anticoagulated in the setting of a perforated ulcer, Dr. Debara Pickett not recommending amiodarone at this time due to risk of cardioversion/stroke off anticoagulation.  Recommendation is to pursue rate control only with beta-blocker or diltiazem. -Rate currently in 110s. Patient will need low dose diltiazem if rate goes above 120 and if BP is able to tolerate.   DVT prophylaxis: SCDs Code Status: I have personally discussed CODE STATUS with the patient.  He wishes to be FULL code. Family Communication: Daughter at bedside. Disposition Plan: Anticipate discharge after clinical improvement. Consults called: General surgery (Dr. Lucia Gaskins) Admission status: It is my clinical opinion that admission to INPATIENT is reasonable and necessary in this 84 y.o. male . presenting with symptoms of abdominal pain, concerning for perforated peptic ulcer . in the context of PMH including: Untreated duodenitis/peptic ulcer disease . with pertinent positives on physical exam including: Abdominal pain, distention . and pertinent positives on radiographic and laboratory data including: CT with evidence of perforated peptic ulcer . Workup and treatment include keep n.p.o., NG tube, IV antibiotics, aggressive IV fluid resuscitation, IV PPI.  Management listed above.  Given the aforementioned, the predictability of an adverse outcome is felt to be significant. I expect that the patient will require at least 2 midnights in the hospital to treat this condition.    Shela Leff MD [facility] Pager [phone]  If 7PM-7AM, please contact night-coverage [email or web address] Password [facility]  01/31/2018, 11:55 PM

## 2018-01-31 NOTE — Consult Note (Signed)
Re:   Manuel Reilly DOB:   08-17-1923 MRN:   [id number]  Chief Complaint Abdominal pain  ASSESEMENT AND PLAN: 1.  Perforated gastric/duodenal ulcer  The patient was noted to have duodenal thickening and inflammation on a CT scan in July 2019.  The area of concern has minimal air, minimal fluid, and no evidence of contrast - so it appears fairly well contained.  I talked to the daughter and patient about medical vs surgical tx.  For now will proceed with medical treatment: antibiotics, antiulcer meds, NGT, IVF, and repeat exams.  I suggest that he be admitted at least to step down at this time.  I have spoken to Dr. Ulyess Blossom about the plan.  We will follow.  2.  HTN 3.  Diverticulosis 4.  Emphysema  On an inhaler 5.  Hard of hearing 6.  Right scrotal hydrocele  Chief Complaint  Patient presents with  . Abdominal Pain   PHYSICIAN REQUESTING CONSULTATION:  Dr. Pricilla Loveless, [facility]  HISTORY OF PRESENT ILLNESS: Manuel Reilly is a 84 y.o. (DOB: Nov 15, 1923)  white male whose primary care physician is [name], MD. His daughter, Mazen Marcin, is at the bedside.   The patient has had vague abdomen pain on and off over the last month.  He has taken some tylenol for this.  Over the last 48 hours, this pain has become more constant and [facility].  He smoked remotely (in his 67's), he is not on NSAIDs, and is not on steroids.  He had an esophageal impaction in July 2019, but did not undergo an endoscopy.  The impaction cleared spontaneously.    He was noted to have duodenal thickening and possible inflammation around his duodenum on a CT scan in 09/2017 during that impaction.  But it sounds like this was never addressed and the patient was unaware of this.  He had a recurrent right inguinal hernia repaired by Dr. Daphine Deutscher in 2006.  His last colonoscopy was 2004 by Dr. Russella Dar.   CT scan of the abdomen - 01/31/2018 - 1. Findings highly suspicious for perforated  peptic ulcer with small foci of extraluminal air in the porta hepatis and right upper quadrant, wall thickening of the peri pyloric stomach and proximal duodenum. No organized abscess.  2. Mild gallbladder wall thickening and adjacent stranding is likely reactive secondary to gastric/duodenal inflammation, similar to slightly improved from prior exam.  3. Colonic diverticulosis without diverticulitis.  4. Absent renal excretion on delayed phase imaging consistent with underlying renal dysfunction.  5. Small right pleural effusion.  6. Emphysema at the lung bases with round atelectasis in the left lower lobe. Emphysema (ICD10-J43.9).  7.  Aortic Atherosclerosis (ICD10-I70.0). WBC - 9,900 - 01/31/2018   Past Medical History:  Diagnosis Date  . Allergy   . Anxiety   . Diverticulosis   . History of colon polyps   . History of hemorrhoids   . History of inguinal herniorrhaphy    right  . Hyperlipidemia   . Hypogonadism, male   . Sciatica    right      Past Surgical History:  Procedure Laterality Date  . CARPAL TUNNEL RELEASE    . HERNIA REPAIR        Current Facility-Administered Medications  Medication Dose Route Frequency Provider Last Rate Last Dose  . ciprofloxacin (CIPRO) IVPB 400 mg  400 mg Intravenous Once Pricilla Loveless, MD      . iopamidol (ISOVUE-300) 61 % injection           .  metroNIDAZOLE (FLAGYL) IVPB 500 mg  500 mg Intravenous Once Pricilla Loveless, MD      . sodium chloride (PF) 0.9 % injection           . sodium chloride 0.9 % bolus 1,000 mL  1,000 mL Intravenous Once Pricilla Loveless, MD       Current Outpatient Medications  Medication Sig Dispense Refill  . acetaminophen (TYLENOL) 500 MG tablet Take 500 mg by mouth every 6 (six) hours as needed for mild pain, moderate pain, fever or headache.    . albuterol (PROVENTIL HFA;VENTOLIN HFA) 108 (90 Base) MCG/ACT inhaler Inhale 2 puffs into the lungs every 6 (six) hours as needed for wheezing or shortness of breath.     . ALPRAZolam (XANAX) 0.25 MG tablet Take 1 tablet (0.25 mg total) by mouth at bedtime as needed. Needs visit for refill (Patient taking differently: Take 0.25 mg by mouth at bedtime as needed for anxiety. Needs visit for refill) 30 tablet 0  . pantoprazole (PROTONIX) 20 MG tablet Take 1 tablet (20 mg total) by mouth daily. (Patient not taking: Reported on 01/31/2018) 30 tablet 0      Allergies  Allergen Reactions  . Penicillins Hives, Shortness Of Breath and Rash    Has patient had a PCN reaction causing immediate rash, facial/tongue/throat swelling, SOB or lightheadedness with hypotension: Yes Has patient had a PCN reaction causing severe rash involving mucus membranes or skin necrosis: Yes Has patient had a PCN reaction that required hospitalization No Has patient had a PCN reaction occurring within the last 10 years: No If all of the above answers are "NO", then may proceed with Cephalosporin use.   . Procaine Hcl Anaphylaxis and Swelling  . Wasp Venom Swelling    Swelling of throat and swelling at the site     REVIEW OF SYSTEMS: Skin:  No history of rash.  No history of abnormal moles. Infection:  No history of hepatitis or HIV.  No history of MRSA. Neurologic:  No history of stroke.  No history of seizure.  No history of headaches. Cardiac:  No history of hypertension. No history of heart disease.   No history of seeing a cardiologist. Pulmonary:  Emphysema.  Uses inhaler.  Endocrine:  No diabetes. No thyroid disease. Gastrointestinal:  See HPI Urologic:  No history of kidney stones.  No history of bladder infections. Musculoskeletal:  No history of joint or back disease. Hematologic:  No bleeding disorder.  No history of anemia.  Not anticoagulated. Psycho-social:  The patient is oriented.   The patient has no obvious psychologic or social impairment to understanding our conversation and plan.  SOCIAL and FAMILY HISTORY: Widowed about 9 years. Lives by self.  Still drives  a car. Daughter, Kemauri Musa, is at the bedside.  He has 2 other daughters.  No family history of ulcer disease.  PHYSICAL EXAM: BP (!) 105/50   Pulse (!) 128   Temp 98 F (36.7 C) (Oral)   Resp 18   SpO2 94%   General: Older WM who is alert.  He is hard of hearing. Skin:  Inspection and palpation - no mass or rash. Eyes:  Conjunctiva and lids unremarkable.            Pupils are equal Ears, Nose, Mouth, and Throat:  Ears and nose unremarkable            Lips and teeth are unremarable. Neck: Supple. No mass, trachea midline.  No thyroid mass. Lymph Nodes:  No supraclavicular, cervical, or inguinal nodes. Lungs: Normal respiratory effort.  Clear to auscultation and symmetric breath sounds.  Hurts to take a deep breath. Heart:  Palpation of the heart is normal.            Auscultation: RRR. No murmur or rub.  Abdomen: Mild distention.  Rare bowel sounds.  Tender upper abdomen with some guarding.  Right scrotal hydrocele. Rectal: Not done. Musculoskeletal:  Okay muscle strength and ROM  in upper and lower extremities.  Neurologic:  Grossly intact to motor and sensory function. Psychiatric: Normal judgement and insight. Behavior is normal.            Oriented to time, person, place.   DATA REVIEWED, COUNSELING AND COORDINATION OF CARE: Epic notes reviewed. Counseling and coordination of care exceeded more than 50% of the time spent with patient. Total time spent with patient and charting: 6660  Ovidio Kinavid Seniyah Esker, MD,  [facility] [place] [address]  [address]   [place], [place]    [id number] Phone:  [phone] FAX:  [phone]

## 2018-01-31 NOTE — Progress Notes (Signed)
Pharmacy Antibiotic Note  Manuel Reilly is a 84 y.o. male admitted on 01/31/2018 with intra-abdominal  infection.  Pharmacy has been consulted for cipro dosing.  Plan: Cipro 400 mg IV q12h Flagyl 500 mg IV q8h (MD)     Temp (24hrs), Avg:98 F (36.7 C), Min:98 F (36.7 C), Max:98 F (36.7 C)  Recent Labs  Lab 01/31/18 1708  WBC 9.9  CREATININE 1.20    CrCl cannot be calculated (Unknown ideal weight.).    Allergies  Allergen Reactions  . Penicillins Hives, Shortness Of Breath and Rash    Has patient had a PCN reaction causing immediate rash, facial/tongue/throat swelling, SOB or lightheadedness with hypotension: Yes Has patient had a PCN reaction causing severe rash involving mucus membranes or skin necrosis: Yes Has patient had a PCN reaction that required hospitalization No Has patient had a PCN reaction occurring within the last 10 years: No If all of the above answers are "NO", then may proceed with Cephalosporin use.   . Procaine Hcl Anaphylaxis and Swelling  . Wasp Venom Swelling    Swelling of throat and swelling at the site     Antimicrobials this admission: 11/27 cipro >>  11/27 flagyl >>   Dose adjustments this admission:   Microbiology results:  BCx:   UCx:    Sputum:    MRSA PCR:   Thank you for allowing pharmacy to be a part of this patient's care.  Lorenza EvangelistGreen, Haruki Arnold R 01/31/2018 11:31 PM

## 2018-01-31 NOTE — ED Triage Notes (Signed)
Pt reports abdominal pain and tenderness x2 days. Pt reports no trouble with BM. Pt denies N/V.  Pt's family reports he ate a pizza 2 days ago.

## 2018-01-31 NOTE — ED Notes (Signed)
Pt aware urine sample is needed 

## 2018-02-01 ENCOUNTER — Inpatient Hospital Stay (HOSPITAL_COMMUNITY): Payer: Medicare Other

## 2018-02-01 DIAGNOSIS — K275 Chronic or unspecified peptic ulcer, site unspecified, with perforation: Secondary | ICD-10-CM

## 2018-02-01 DIAGNOSIS — E861 Hypovolemia: Secondary | ICD-10-CM | POA: Insufficient documentation

## 2018-02-01 DIAGNOSIS — I9589 Other hypotension: Secondary | ICD-10-CM

## 2018-02-01 DIAGNOSIS — N179 Acute kidney failure, unspecified: Secondary | ICD-10-CM

## 2018-02-01 LAB — CBC
HCT: 46 % (ref 39.0–52.0)
HCT: 42.2 % (ref 39.0–52.0)
HEMATOCRIT: 43.8 % (ref 39.0–52.0)
HEMOGLOBIN: 15.4 g/dL (ref 13.0–17.0)
HEMOGLOBIN: 13.9 g/dL (ref 13.0–17.0)
Hemoglobin: 14.3 g/dL (ref 13.0–17.0)
MCH: 30.9 pg (ref 26.0–34.0)
MCH: 30.7 pg (ref 26.0–34.0)
MCH: 31 pg (ref 26.0–34.0)
MCHC: 33.5 g/dL (ref 30.0–36.0)
MCHC: 32.9 g/dL (ref 30.0–36.0)
MCHC: 32.6 g/dL (ref 30.0–36.0)
MCV: 92.4 fL (ref 80.0–100.0)
MCV: 93.2 fL (ref 80.0–100.0)
MCV: 94.8 fL (ref 80.0–100.0)
NRBC: 0 % (ref 0.0–0.2)
PLATELETS: 164 10*3/uL (ref 150–400)
PLATELETS: 143 10*3/uL — AB (ref 150–400)
Platelets: 143 10*3/uL — ABNORMAL LOW (ref 150–400)
RBC: 4.98 MIL/uL (ref 4.22–5.81)
RBC: 4.53 MIL/uL (ref 4.22–5.81)
RBC: 4.62 MIL/uL (ref 4.22–5.81)
RDW: 13.9 % (ref 11.5–15.5)
RDW: 14.2 % (ref 11.5–15.5)
RDW: 14.3 % (ref 11.5–15.5)
WBC: 12.5 10*3/uL — ABNORMAL HIGH (ref 4.0–10.5)
WBC: 11.8 10*3/uL — ABNORMAL HIGH (ref 4.0–10.5)
WBC: 11.4 10*3/uL — AB (ref 4.0–10.5)
nRBC: 0 % (ref 0.0–0.2)
nRBC: 0 % (ref 0.0–0.2)

## 2018-02-01 LAB — PROTIME-INR
INR: 1.41
Prothrombin Time: 17.1 seconds — ABNORMAL HIGH (ref 11.4–15.2)

## 2018-02-01 LAB — TROPONIN I
TROPONIN I: 0.09 ng/mL — AB (ref <0.03)
Troponin I: 0.09 ng/mL (ref <0.03)
Troponin I: 0.14 ng/mL (ref <0.03)

## 2018-02-01 LAB — MRSA PCR SCREENING: MRSA by PCR: NEGATIVE

## 2018-02-01 LAB — COMPREHENSIVE METABOLIC PANEL
ALBUMIN: 3.2 g/dL — AB (ref 3.5–5.0)
ALT: 64 U/L — ABNORMAL HIGH (ref 0–44)
AST: 46 U/L — ABNORMAL HIGH (ref 15–41)
Alkaline Phosphatase: 84 U/L (ref 38–126)
Anion gap: 8 (ref 5–15)
BILIRUBIN TOTAL: 0.9 mg/dL (ref 0.3–1.2)
BUN: 42 mg/dL — ABNORMAL HIGH (ref 8–23)
CALCIUM: 7.8 mg/dL — AB (ref 8.9–10.3)
CO2: 23 mmol/L (ref 22–32)
Chloride: 104 mmol/L (ref 98–111)
Creatinine, Ser: 1.21 mg/dL (ref 0.61–1.24)
GFR calc Af Amer: 60 mL/min — ABNORMAL LOW (ref >60)
GFR calc non Af Amer: 52 mL/min — ABNORMAL LOW (ref >60)
Glucose, Bld: 66 mg/dL — ABNORMAL LOW (ref 70–99)
Potassium: 3.6 mmol/L (ref 3.5–5.1)
Sodium: 135 mmol/L (ref 135–145)
Total Protein: 5.6 g/dL — ABNORMAL LOW (ref 6.5–8.1)

## 2018-02-01 LAB — TSH: TSH: 2.219 u[IU]/mL (ref 0.350–4.500)

## 2018-02-01 LAB — MAGNESIUM: Magnesium: 2 mg/dL (ref 1.7–2.4)

## 2018-02-01 MED ORDER — POTASSIUM CHLORIDE 10 MEQ/100ML IV SOLN
10.0000 meq | INTRAVENOUS | Status: AC
  Administered 2018-02-01 (×4): 10 meq via INTRAVENOUS
  Filled 2018-02-01 (×4): qty 100

## 2018-02-01 MED ORDER — ALBUTEROL SULFATE (2.5 MG/3ML) 0.083% IN NEBU: 2.5000 mg | INHALATION_SOLUTION | Freq: Four times a day (QID) | RESPIRATORY_TRACT | Status: DC | PRN

## 2018-02-01 MED ORDER — LIDOCAINE HCL URETHRAL/MUCOSAL 2 % EX GEL
1.0000 "application " | Freq: Once | CUTANEOUS | Status: AC
  Administered 2018-02-01: 1
  Filled 2018-02-01: qty 5

## 2018-02-01 MED ORDER — LORAZEPAM 2 MG/ML IJ SOLN
0.5000 mg | Freq: Once | INTRAMUSCULAR | Status: AC
  Administered 2018-02-01: 0.5 mg via INTRAVENOUS
  Filled 2018-02-01: qty 1

## 2018-02-01 MED ORDER — HALOPERIDOL LACTATE 5 MG/ML IJ SOLN
5.0000 mg | Freq: Once | INTRAMUSCULAR | Status: AC
  Administered 2018-02-01: 5 mg via INTRAMUSCULAR
  Filled 2018-02-01: qty 1

## 2018-02-01 MED ORDER — DEXTROSE-NACL 5-0.45 % IV SOLN
INTRAVENOUS | Status: DC
  Administered 2018-02-01: 15:00:00 via INTRAVENOUS

## 2018-02-01 MED ORDER — LIDOCAINE HCL URETHRAL/MUCOSAL 2 % EX GEL
CUTANEOUS | Status: AC
  Administered 2018-02-01: 1
  Filled 2018-02-01: qty 5

## 2018-02-01 MED ORDER — CIPROFLOXACIN IN D5W 400 MG/200ML IV SOLN
400.0000 mg | Freq: Two times a day (BID) | INTRAVENOUS | Status: DC
  Administered 2018-02-01 – 2018-02-05 (×8): 400 mg via INTRAVENOUS
  Filled 2018-02-01 (×8): qty 200

## 2018-02-01 NOTE — Progress Notes (Signed)
CRITICAL VALUE ALERT  Critical Value: Troponin 0.14  Date & Time Notied:  Not notified. MD notified at 1620  Provider Notified: MD Roda ShuttersXu  Orders Received/Actions taken: aware

## 2018-02-01 NOTE — ED Notes (Signed)
Pt heart rate and rhythm changed to afib and tachy provider notified. ekg done verbal order

## 2018-02-01 NOTE — ED Notes (Signed)
Hospitalist at bedside, updating patient and family on POC

## 2018-02-01 NOTE — ED Notes (Signed)
This Clinical research associatewriter spoke with General Surgery Maisie Fushomas regarding NGT placement for patient. Was told surgeon would come to patient's bedside to assess patient and speak with family shortly. Updated family on this POC

## 2018-02-01 NOTE — ED Notes (Signed)
ED TO INPATIENT HANDOFF REPORT  Name/Age/Gender Manuel Reilly 84 y.o. male  Code Status    Code Status Orders  (From admission, onward)         Start     Ordered   01/31/18 2317  Full code  Continuous     01/31/18 2318        Code Status History    This patient has a current code status but no historical code status.      Home/SNF/Other Home  Chief Complaint Abdominal Pain  Level of Care/Admitting Diagnosis ED Disposition    ED Disposition Condition Comment   Admit  Hospital Area: [facility] [[id number]]  Level of Care: Stepdown [14]  Admit to SDU based on following criteria: Hemodynamic compromise or significant risk of instability:  Patient requiring short term acute titration and management of vasoactive drips, and invasive monitoring (i.e., CVP and Arterial line).  Diagnosis: Perforated peptic ulcer [facility]) [[id number]]  Admitting Physician: John Giovanni [[id number]]  Attending Physician: John Giovanni [[id number]]  Estimated length of stay: past midnight tomorrow  Certification:: I certify this patient will need inpatient services for at least 2 midnights  PT Class (Do Not Modify): Inpatient [101]  PT Acc Code (Do Not Modify): Private [1]       Medical History Past Medical History:  Diagnosis Date  . Allergy   . Anxiety   . Diverticulosis   . History of colon polyps   . History of hemorrhoids   . History of inguinal herniorrhaphy    right  . Hyperlipidemia   . Hypogonadism, male   . Sciatica    right    Allergies Allergies  Allergen Reactions  . Penicillins Hives, Shortness Of Breath and Rash    Has patient had a PCN reaction causing immediate rash, facial/tongue/throat swelling, SOB or lightheadedness with hypotension: Yes Has patient had a PCN reaction causing severe rash involving mucus membranes or skin necrosis: Yes Has patient had a PCN reaction that required hospitalization No Has patient had a PCN reaction  occurring within the last 10 years: No If all of the above answers are "NO", then may proceed with Cephalosporin use.   . Procaine Hcl Anaphylaxis and Swelling  . Wasp Venom Swelling    Swelling of throat and swelling at the site     IV Location/Drains/Wounds Patient Lines/Drains/Airways Status   Active Line/Drains/Airways    Name:   Placement date:   Placement time:   Site:   Days:   Peripheral IV 02/01/18 Right Wrist   02/01/18    0219    Wrist   less than 1          Labs/Imaging Results for orders placed or performed during the hospital encounter of 01/31/18 (from the past 48 hour(s))  Lipase, blood     Status: Abnormal   Collection Time: 01/31/18  5:08 PM  Result Value Ref Range   Lipase 59 (H) 11 - 51 U/L    Comment: Performed at [facility], [address], [place], [place] [address]  Comprehensive metabolic panel     Status: Abnormal   Collection Time: 01/31/18  5:08 PM  Result Value Ref Range   Sodium 133 (L) 135 - 145 mmol/L   Potassium 3.6 3.5 - 5.1 mmol/L   Chloride 98 98 - 111 mmol/L   CO2 21 (L) 22 - 32 mmol/L   Glucose, Bld 73 70 - 99 mg/dL   BUN 38 (H) 8 - 23  mg/dL   Creatinine, Ser 4.09 0.61 - 1.24 mg/dL   Calcium 8.3 (L) 8.9 - 10.3 mg/dL   Total Protein 6.2 (L) 6.5 - 8.1 g/dL   Albumin 3.4 (L) 3.5 - 5.0 g/dL   AST 70 (H) 15 - 41 U/L   ALT 90 (H) 0 - 44 U/L   Alkaline Phosphatase 102 38 - 126 U/L   Total Bilirubin 1.1 0.3 - 1.2 mg/dL   GFR calc non Af Amer 52 (L) >60 mL/min   GFR calc Af Amer >60 >60 mL/min   Anion gap 14 5 - 15    Comment: Performed at [facility], [address], [place], [place] [address]  CBC with Differential     Status: Abnormal   Collection Time: 01/31/18  5:08 PM  Result Value Ref Range   WBC 9.9 4.0 - 10.5 K/uL    Comment: WHITE COUNT CONFIRMED ON SMEAR   RBC 5.59 4.22 - 5.81 MIL/uL   Hemoglobin 16.7 13.0 - 17.0 g/dL   HCT 47.8 29.5 - 62.1 %   MCV 90.7 80.0 - 100.0 fL   MCH 29.9  26.0 - 34.0 pg   MCHC 32.9 30.0 - 36.0 g/dL   RDW 30.8 65.7 - 84.6 %   Platelets 180 150 - 400 K/uL   nRBC 0.0 0.0 - 0.2 %   Neutrophils Relative % 93 %   Neutro Abs 9.2 (H) 1.7 - 7.7 K/uL   Lymphocytes Relative 3 %   Lymphs Abs 0.3 (L) 0.7 - 4.0 K/uL   Monocytes Relative 2 %   Monocytes Absolute 0.2 0.1 - 1.0 K/uL   Eosinophils Relative 0 %   Eosinophils Absolute 0.0 0.0 - 0.5 K/uL   Basophils Relative 0 %   Basophils Absolute 0.0 0.0 - 0.1 K/uL   WBC Morphology INCREASED BANDS (>20% BANDS)     Comment: VACUOLATED NEUTROPHILS   Immature Granulocytes 2 %   Abs Immature Granulocytes 0.17 (H) 0.00 - 0.07 K/uL   Burr Cells PRESENT     Comment: Performed at [facility], [address], [place], [place] [address]  Troponin I - Once     Status: Abnormal   Collection Time: 01/31/18  5:08 PM  Result Value Ref Range   Troponin I 0.09 (HH) <0.03 ng/mL    Comment: CRITICAL RESULT CALLED TO, READ BACK BY AND VERIFIED WITH: Gena Fray [id number] @ 1941 BY Knute Neu Performed at [facility], [address], [place], [place] [address]   Urinalysis, Routine w reflex microscopic     Status: Abnormal   Collection Time: 01/31/18 11:15 PM  Result Value Ref Range   Color, Urine YELLOW YELLOW   APPearance CLEAR CLEAR   Specific Gravity, Urine 1.039 (H) 1.005 - 1.030   pH 5.0 5.0 - 8.0   Glucose, UA NEGATIVE NEGATIVE mg/dL   Hgb urine dipstick SMALL (A) NEGATIVE   Bilirubin Urine NEGATIVE NEGATIVE   Ketones, ur NEGATIVE NEGATIVE mg/dL   Protein, ur NEGATIVE NEGATIVE mg/dL   Nitrite NEGATIVE NEGATIVE   Leukocytes, UA NEGATIVE NEGATIVE   RBC / HPF 6-10 0 - 5 RBC/hpf   WBC, UA 0-5 0 - 5 WBC/hpf   Bacteria, UA NONE SEEN NONE SEEN   Squamous Epithelial / LPF 0-5 0 - 5   Mucus PRESENT    Hyaline Casts, UA PRESENT     Comment: Performed at [facility], [address], [place], [place] [address]  Troponin I -  Now Then Q6H     Status:  Abnormal   Collection Time: 02/01/18  1:32 AM  Result Value Ref Range   Troponin I 0.09 (HH) <0.03 ng/mL    Comment: CRITICAL VALUE NOTED.  VALUE IS CONSISTENT WITH PREVIOUSLY REPORTED AND CALLED VALUE. Performed at [facility], [address] [place], [place] [address]   CBC     Status: Abnormal   Collection Time: 02/01/18  1:32 AM  Result Value Ref Range   WBC 12.5 (H) 4.0 - 10.5 K/uL    Comment: WHITE COUNT CONFIRMED ON SMEAR   RBC 4.98 4.22 - 5.81 MIL/uL   Hemoglobin 15.4 13.0 - 17.0 g/dL   HCT 21.3 08.6 - 57.8 %   MCV 92.4 80.0 - 100.0 fL   MCH 30.9 26.0 - 34.0 pg   MCHC 33.5 30.0 - 36.0 g/dL   RDW 46.9 62.9 - 52.8 %   Platelets 164 150 - 400 K/uL   nRBC 0.0 0.0 - 0.2 %    Comment: Performed at [facility], [address], [place], [place] [address]  Troponin I - Now Then Q6H     Status: Abnormal   Collection Time: 02/01/18  5:11 AM  Result Value Ref Range   Troponin I 0.09 (HH) <0.03 ng/mL    Comment: CRITICAL VALUE NOTED.  VALUE IS CONSISTENT WITH PREVIOUSLY REPORTED AND CALLED VALUE. Performed at [facility], [address], [place], [place] [address]   CBC     Status: Abnormal   Collection Time: 02/01/18  5:11 AM  Result Value Ref Range   WBC 11.8 (H) 4.0 - 10.5 K/uL   RBC 4.53 4.22 - 5.81 MIL/uL   Hemoglobin 13.9 13.0 - 17.0 g/dL   HCT 72.5 36.6 - 44.0 %   MCV 93.2 80.0 - 100.0 fL   MCH 30.7 26.0 - 34.0 pg   MCHC 32.9 30.0 - 36.0 g/dL   RDW 34.7 42.5 - 95.6 %   Platelets 143 (L) 150 - 400 K/uL   nRBC 0.0 0.0 - 0.2 %    Comment: Performed at [facility], [address], [place], [place] [address]  TSH     Status: None   Collection Time: 02/01/18  5:55 AM  Result Value Ref Range   TSH 2.219 0.350 - 4.500 uIU/mL    Comment: Performed by a 3rd Generation assay with a functional sensitivity of <=0.01 uIU/mL. Performed at [facility], [address],  [place], [place] [address]    Dg Chest 2 View  Result Date: 01/31/2018 CLINICAL DATA:  Upper abdominal pain EXAM: CHEST - 2 VIEW COMPARISON:  09/09/2017 FINDINGS: Small pleural effusions. No focal consolidation. Scarring in the right lower lobe. Stable cardiomediastinal silhouette. No pneumothorax. Lucency in the left upper quadrant thought to reflect gas within the gastric fundus. IMPRESSION: Small bilateral pleural effusions Electronically Signed   By: Jasmine Pang M.D.   On: 01/31/2018 18:03   Ct Abdomen Pelvis W Contrast  Result Date: 01/31/2018 CLINICAL DATA:  84 year old with acute abdominal pain. EXAM: CT ABDOMEN AND PELVIS WITH CONTRAST TECHNIQUE: Multidetector CT imaging of the abdomen and pelvis was performed using the standard protocol following bolus administration of intravenous contrast. CONTRAST:  ISOVUE-300 IOPAMIDOL (ISOVUE-300) INJECTION 61% COMPARISON:  CT 09/09/2017 FINDINGS: Lower chest: Unchanged ovoid density in the left lower lobe consistent with round atelectasis. Bibasilar scarring. Subpleural bleb in the right lower lobe small right pleural effusion is new.  There are coronary artery calcifications. Hepatobiliary: 15 mm cyst in the left lobe of the liver. Minimal gallbladder wall thickening and adjacent stranding is likely reactive secondary to gastric/duodenal inflammation, similar to slightly improved from prior exam. No calcified gallstone. No biliary dilatation. Pancreas: Fatty atrophy.  No ductal dilatation or inflammation. Spleen: Normal in size without focal abnormality. Adrenals/Urinary Tract: Normal adrenal glands. No hydronephrosis or perinephric edema. Homogeneous renal enhancement. Absent renal excretion on delayed phase imaging suggesting underlying renal dysfunction. Symmetric age-related renal atrophy. 12 mm cyst from the lower right kidney. Urinary bladder is physiologically distended without wall thickening. Stomach/Bowel: Stomach distended with air-fluid level.  Peri pyloric wall thickening of the distal stomach and duodenum with mucosal enhancement and perigastric/peri duodenal inflammation. Small foci of extraluminal air in the porta hepatis consistent perforation. Regional stranding and free fluid without organized abscess. Duodenal diverticulum about the fourth portion again seen without inflammation. Remaining small bowel is unremarkable. No obstruction. Mild distal descending and sigmoid diverticulosis without diverticulitis. Appendix is not confidently visualized. Vascular/Lymphatic: Aorta bi-iliac atherosclerosis. No aneurysm. Circumaortic left renal vein. No adenopathy. Reproductive: Prostatic calcifications. Other: Foci of air in the right upper quadrant of the abdomen in the porta hepatis consistent with bowel perforation. Right upper quadrant inflammatory change and free fluid without organized abscess. Small amount of free fluid tracks into the pelvis. Left inguinal canal contains fat and small amount of free fluid. Prior right inguinal hernia repair. Right scrotal hydroceles partially included. Musculoskeletal: Multilevel degenerative change throughout spine. Unchanged grade 1 retrolisthesis of L3 on L4, degenerative. There are no acute or suspicious osseous abnormalities. IMPRESSION: 1. Findings highly suspicious for perforated peptic ulcer with small foci of extraluminal air in the porta hepatis and right upper quadrant, wall thickening of the peri pyloric stomach and proximal duodenum. No organized abscess. 2. Mild gallbladder wall thickening and adjacent stranding is likely reactive secondary to gastric/duodenal inflammation, similar to slightly improved from prior exam. 3. Colonic diverticulosis without diverticulitis. 4. Absent renal excretion on delayed phase imaging consistent with underlying renal dysfunction. 5. Small right pleural effusion. 6. Emphysema at the lung bases with round atelectasis in the left lower lobe. Emphysema (ICD10-J43.9). 7.   Aortic Atherosclerosis (ICD10-I70.0). Critical Value/emergent results were called by telephone at the time of interpretation on 01/31/2018 at 9:22 pm to Dr. Pricilla Loveless , who verbally acknowledged these results. Electronically Signed   By: Narda Rutherford M.D.   On: 01/31/2018 21:24   EKG Interpretation  Date/Time:  Wednesday January 31 2018 19:11:43 EST Ventricular Rate:  100 PR Interval:    QRS Duration: 141 QT Interval:  377 QTC Calculation: 487 R Axis:   -51 Text Interpretation:  Junctional tachycardia RBBB and LAFB rate is faster compared to July 2019 Confirmed by Pricilla Loveless [id number]) on 01/31/2018 7:15:42 PM   Pending Labs Unresulted Labs (From admission, onward)    Start     Ordered   01/31/18 2319  CBC  Now then every 4 hours,   R     01/31/18 2318   01/31/18 2311  Troponin I - Now Then Q6H  Now then every 6 hours,   R     01/31/18 2318          Vitals/Pain Today's Vitals   02/01/18 0530 02/01/18 0600 02/01/18 0630 02/01/18 0720  BP: (!) 113/53 (!) 99/47 (!) 115/51 (!) 115/54  Pulse: (!) 125 (!) 108 (!) 108 (!) 109  Resp: 13 17 (!) 21 (!) 28  Temp:  TempSrc:      SpO2: 92% 90% 91% 93%  Weight:      PainSc:        Isolation Precautions No active isolations  Medications Medications  iopamidol (ISOVUE-300) 61 % injection (has no administration in time range)  sodium chloride (PF) 0.9 % injection (has no administration in time range)  metroNIDAZOLE (FLAGYL) IVPB 500 mg (0 mg Intravenous Paused 02/01/18 0710)  morphine 2 MG/ML injection 1 mg (has no administration in time range)  ondansetron (ZOFRAN) injection 4 mg (has no administration in time range)  0.9 %  sodium chloride infusion ( Intravenous Paused 02/01/18 0711)  pantoprazole (PROTONIX) 80 mg in sodium chloride 0.9 % 250 mL (0.32 mg/mL) infusion (8 mg/hr Intravenous New Bag/Given 02/01/18 0258)  pantoprazole (PROTONIX) injection 40 mg (has no administration in time range)  sucralfate  (CARAFATE) 1 GM/10ML suspension 1 g (1 g Oral Not Given 02/01/18 0427)  acetaminophen (TYLENOL) tablet 650 mg (has no administration in time range)    Or  acetaminophen (TYLENOL) suppository 650 mg (has no administration in time range)  ipratropium-albuterol (DUONEB) 0.5-2.5 (3) MG/3ML nebulizer solution 3 mL (has no administration in time range)  albuterol (PROVENTIL) (2.5 MG/3ML) 0.083% nebulizer solution 2.5 mg (has no administration in time range)  ciprofloxacin (CIPRO) IVPB 400 mg (has no administration in time range)  fentaNYL (SUBLIMAZE) injection 50 mcg (50 mcg Intravenous Given 01/31/18 1848)  ondansetron (ZOFRAN) injection 4 mg (4 mg Intravenous Given 01/31/18 1848)  sodium chloride 0.9 % bolus 500 mL (0 mLs Intravenous Stopped 02/01/18 0137)  iopamidol (ISOVUE-300) 61 % injection 100 mL (100 mLs Intravenous Contrast Given 01/31/18 2059)  fentaNYL (SUBLIMAZE) injection 50 mcg (50 mcg Intravenous Given 01/31/18 2054)  ciprofloxacin (CIPRO) IVPB 400 mg (0 mg Intravenous Stopped 02/01/18 0256)  metroNIDAZOLE (FLAGYL) IVPB 500 mg (0 mg Intravenous Stopped 02/01/18 0138)  sodium chloride 0.9 % bolus 1,000 mL (0 mLs Intravenous Stopped 02/01/18 0138)  sodium chloride 0.9 % bolus 1,000 mL (0 mLs Intravenous Stopped 02/01/18 0256)  pantoprazole (PROTONIX) 80 mg in sodium chloride 0.9 % 100 mL IVPB (0 mg Intravenous Stopped 02/01/18 0256)  lidocaine (XYLOCAINE) 2 % jelly 1 application (1 application Other Given 02/01/18 0126)  LORazepam (ATIVAN) injection 0.5 mg (0.5 mg Intravenous Given 02/01/18 0125)  lidocaine (XYLOCAINE) 2 % jelly (1 application  Given 02/01/18 0259)    Mobility non-ambulatory

## 2018-02-01 NOTE — ED Notes (Signed)
General surgery at bedside, speaking with patient and family.

## 2018-02-01 NOTE — ED Notes (Signed)
This Clinical research associatewriter was told by [employer] that the radiologist Dr. Lowella DandyHenn refuses to place patient's NGT using radiation until a physician attempts NGT placement. Hospitalist Xu notified- told this Clinical research associatewriter to contact the ordering provider, General surgery consult, to place NGT. Text page using Amion placed to General surgery on call.

## 2018-02-01 NOTE — Consult Note (Signed)
NAME:  Manuel Reilly, MRN:  [id number], DOB:  August 28, 1923, LOS: 1 ADMISSION DATE:  01/31/2018, CONSULTATION DATE:  02/01/2018 REFERRING MD:  Dr. Loney Lohathore, CHIEF COMPLAINT:  Abd pain  Brief History   [name], lives alone and fully independent with abd pain x 2 days and chills found to have perforated gastric ulcer.  CCS consulted.  Medical management for now.  Hypotensive but responding to fluids.  PCCM consulting.  History of present illness   84 year old male with PMH significant for but not limited to hypertension (although not on medications) and emphysema who uses inhaler presenting from home with 2 day history of abd pain and chills.  Patient lives alone and is fully independent in ADL. Daughter reports he was normal on Monday and ate pizza.  On Tuesday started to complain of worsening abd pain and had chills.  Denies vomiting, fever, changes in bowels.  Initially pain was aggravated by food but now constant.  In the ER, noted to be afebrile and initially hemodynamically stable but then developed tachycardia and hypotension in which has since responded to IVF.  Labs noted for WBC 9.9, Hgb 16.7, lipase 59, mild transamanitis, troponin 0.09, UA negative, non acute EKG, CXR with small bilateral pleural effusions.  CT abd/ pelvis showed small perforated gastric ulcer.  Surgery consulted and patient and his daughter have agreed to medical management for now.  PCCM consulted for concern with hypotension.   Past Medical History  HTN, diverticulosis, HTN, emphysema, anxiety  Significant Hospital Events   11/27 Admitted   Consults:  11/27 CCS  Procedures:    Significant Diagnostic Tests:  11/27 CT abd/ pelvis >> 1. Findings highly suspicious for perforated peptic ulcer with small foci of extraluminal air in the porta hepatis and right upper quadrant, wall thickening of the peri pyloric stomach and proximal duodenum. No organized abscess. 2. Mild gallbladder wall thickening and adjacent  stranding is likely reactive secondary to gastric/duodenal inflammation, similar to slightly improved from prior exam. 3. Colonic diverticulosis without diverticulitis. 4. Absent renal excretion on delayed phase imaging consistent with underlying renal dysfunction. 5. Small right pleural effusion. 6. Emphysema at the lung bases with round atelectasis in the left lower lobe. 7.  Aortic Atherosclerosis   Micro Data:   Antimicrobials:  11/28 flagyl >> 11/28 Cipro >>  Interim history/subjective:   Objective   Blood pressure 92/60, pulse (!) 123, temperature 98 F (36.7 C), temperature source Oral, resp. rate (!) 21, SpO2 91 %.       No intake or output data in the 24 hours ending 02/01/18 0019 There were no vitals filed for this visit.  Examination: General:  Well nourished elderly male lying in bed in NAD. Somewhat HOH HEENT: MM pink/dry, pupils 3/reactive, no JVD, anicteric  Neuro: Alert, oriented, MAE, non focal CV: IR RR, no murmur, +2 pulses PULM: even/non-labored, lungs bilaterally clear, no wheeze  GI: obese, mild distension, diffuse mild tenderness, +BS  Extremities: warm/dry, no edema  Skin: no rashes  Resolved Hospital Problem list    Assessment & Plan:  Perforated gastric ulcer SIRS Hypotension Emphysema  Elevated troponin Mild Transaminitis   P:  Surgery following  TRH to admit to SDU, PCCM will follow  S/p 1.5L NS, NS at 150 ml/hr for goal SBP > 90; MAP >60 (given low diastolic) If ongoing hypotension despite adequate IVF, may consider peripheral pressors and check TTE (no prior TTE found) Monitor UOP NGT to be placed NPO Protonix bolus and gtt  Cipro and flagyl per CCS H/H q 4 Daily CBC, recheck CMET in am T&S ordered Carafate q 6hrs zofran and Morphine prn  Trend troponin  Albuterol prn   Best practice:  Diet: NPO Pain/Anxiety/Delirium protocol (if indicated): na VAP protocol (if indicated): na DVT prophylaxis: SCDs only  GI prophylaxis: PPI  gtt Glucose control: trend on BMET Mobility: BR Code Status: FULL Family Communication: Patient and daughter updated on plan of care. Disposition: SDU  Labs   CBC: Recent Labs  Lab 01/31/18 1708  WBC 9.9  NEUTROABS 9.2*  HGB 16.7  HCT 50.7  MCV 90.7  PLT 180    Basic Metabolic Panel: Recent Labs  Lab 01/31/18 1708  NA 133*  K 3.6  CL 98  CO2 21*  GLUCOSE 73  BUN 38*  CREATININE 1.20  CALCIUM 8.3*   GFR: CrCl cannot be calculated (Unknown ideal weight.). Recent Labs  Lab 01/31/18 1708  WBC 9.9    Liver Function Tests: Recent Labs  Lab 01/31/18 1708  AST 70*  ALT 90*  ALKPHOS 102  BILITOT 1.1  PROT 6.2*  ALBUMIN 3.4*   Recent Labs  Lab 01/31/18 1708  LIPASE 59*   No results for input(s): AMMONIA in the last 168 hours.  ABG No results found for: PHART, PCO2ART, PO2ART, HCO3, TCO2, ACIDBASEDEF, O2SAT   Coagulation Profile: No results for input(s): INR, PROTIME in the last 168 hours.  Cardiac Enzymes: Recent Labs  Lab 01/31/18 1708  TROPONINI 0.09*    HbA1C: No results found for: HGBA1C  CBG: No results for input(s): GLUCAP in the last 168 hours.  Review of Systems:   As per HPI, otherwise negative  Past Medical History  He,  has a past medical history of Allergy, Anxiety, Diverticulosis, History of colon polyps, History of hemorrhoids, History of inguinal herniorrhaphy, Hyperlipidemia, Hypogonadism, male, and Sciatica.   Surgical History    Past Surgical History:  Procedure Laterality Date  . CARPAL TUNNEL RELEASE    . HERNIA REPAIR       Social History   reports that he has never smoked. He has never used smokeless tobacco. He reports that he drinks alcohol. He reports that he does not use drugs.   Family History   His family history includes Cancer in his father and mother; Colon polyps in his brother; Diabetes in his brother; Heart disease in his brother.   Allergies Allergies  Allergen Reactions  . Penicillins Hives,  Shortness Of Breath and Rash    Has patient had a PCN reaction causing immediate rash, facial/tongue/throat swelling, SOB or lightheadedness with hypotension: Yes Has patient had a PCN reaction causing severe rash involving mucus membranes or skin necrosis: Yes Has patient had a PCN reaction that required hospitalization No Has patient had a PCN reaction occurring within the last 10 years: No If all of the above answers are "NO", then may proceed with Cephalosporin use.   . Procaine Hcl Anaphylaxis and Swelling  . Wasp Venom Swelling    Swelling of throat and swelling at the site      Home Medications  Prior to Admission medications   Medication Sig Start Date End Date Taking? Authorizing Provider  acetaminophen (TYLENOL) 500 MG tablet Take 500 mg by mouth every 6 (six) hours as needed for mild pain, moderate pain, fever or headache.   Yes [provider]  albuterol (PROVENTIL HFA;VENTOLIN HFA) 108 (90 Base) MCG/ACT inhaler Inhale 2 puffs into the lungs every 6 (six) hours as needed  for wheezing or shortness of breath.   Yes [provider]  ALPRAZolam (XANAX) 0.25 MG tablet Take 1 tablet (0.25 mg total) by mouth at bedtime as needed. Needs visit for refill Patient taking differently: Take 0.25 mg by mouth at bedtime as needed for anxiety. Needs visit for refill 09/19/17   [name], MD  pantoprazole (PROTONIX) 20 MG tablet Take 1 tablet (20 mg total) by mouth daily. Patient not taking: Reported on 01/31/2018 06/05/17   [name], MD        Posey Boyer, AGACNP-BC [facility] Pgr: [phone] or if no answer [phone] 02/01/2018, 12:45 AM

## 2018-02-01 NOTE — ED Notes (Signed)
ED TO INPATIENT HANDOFF REPORT  Name/Age/Gender Manuel Reilly 84 y.o. male  Code Status    Code Status Orders  (From admission, onward)         Start     Ordered   02/01/18 1103  Do not attempt resuscitation (DNR)  Continuous    Question Answer Comment  In the event of cardiac or respiratory ARREST Do not call a "code blue"   In the event of cardiac or respiratory ARREST Do not perform Intubation, CPR, defibrillation or ACLS   In the event of cardiac or respiratory ARREST Use medication by any route, position, wound care, and other measures to relive pain and suffering. May use oxygen, suction and manual treatment of airway obstruction as needed for comfort.      02/01/18 1102        Code Status History    Date Active Date Inactive Code Status Order ID Comments User Context   01/31/2018 2318 02/01/2018 1102 Full Code [id number]  John Giovanni, MD ED      Home/SNF/Other Home  Chief Complaint Abdominal Pain  Level of Care/Admitting Diagnosis ED Disposition    ED Disposition Condition Comment   Admit  Hospital Area: [facility] [[id number]]  Level of Care: Stepdown [14]  Admit to SDU based on following criteria: Hemodynamic compromise or significant risk of instability:  Patient requiring short term acute titration and management of vasoactive drips, and invasive monitoring (i.e., CVP and Arterial line).  Diagnosis: Perforated peptic ulcer [facility]) [[id number]]  Admitting Physician: John Giovanni [[id number]]  Attending Physician: John Giovanni [[id number]]  Estimated length of stay: past midnight tomorrow  Certification:: I certify this patient will need inpatient services for at least 2 midnights  PT Class (Do Not Modify): Inpatient [101]  PT Acc Code (Do Not Modify): Private [1]       Medical History Past Medical History:  Diagnosis Date  . Allergy   . Anxiety   . Diverticulosis   . History of colon polyps   . History of hemorrhoids    . History of inguinal herniorrhaphy    right  . Hyperlipidemia   . Hypogonadism, male   . Sciatica    right    Allergies Allergies  Allergen Reactions  . Penicillins Hives, Shortness Of Breath and Rash    Has patient had a PCN reaction causing immediate rash, facial/tongue/throat swelling, SOB or lightheadedness with hypotension: Yes Has patient had a PCN reaction causing severe rash involving mucus membranes or skin necrosis: Yes Has patient had a PCN reaction that required hospitalization No Has patient had a PCN reaction occurring within the last 10 years: No If all of the above answers are "NO", then may proceed with Cephalosporin use.   . Procaine Hcl Anaphylaxis and Swelling  . Wasp Venom Swelling    Swelling of throat and swelling at the site     IV Location/Drains/Wounds Patient Lines/Drains/Airways Status   Active Line/Drains/Airways    Name:   Placement date:   Placement time:   Site:   Days:   Peripheral IV 02/01/18 Right Wrist   02/01/18    0219    Wrist   less than 1   Peripheral IV 02/01/18 Right Forearm   02/01/18    1329    Forearm   less than 1   Peripheral IV 02/01/18 Left Hand   02/01/18    1329    Hand   less than 1   NG/OG Tube Nasogastric 14 Fr.  Left nare Aucultation   02/01/18    1300    Left nare   less than 1   Urethral Catheter julie edwards Latex 16 Fr.   02/01/18    1335    Latex   less than 1          Labs/Imaging Results for orders placed or performed during the hospital encounter of 01/31/18 (from the past 48 hour(s))  Lipase, blood     Status: Abnormal   Collection Time: 01/31/18  5:08 PM  Result Value Ref Range   Lipase 59 (H) 11 - 51 U/L    Comment: Performed at [facility], [address], [place], [place] [address]  Comprehensive metabolic panel     Status: Abnormal   Collection Time: 01/31/18  5:08 PM  Result Value Ref Range   Sodium 133 (L) 135 - 145 mmol/L   Potassium 3.6 3.5 - 5.1 mmol/L   Chloride 98 98 -  111 mmol/L   CO2 21 (L) 22 - 32 mmol/L   Glucose, Bld 73 70 - 99 mg/dL   BUN 38 (H) 8 - 23 mg/dL   Creatinine, Ser 6.04 0.61 - 1.24 mg/dL   Calcium 8.3 (L) 8.9 - 10.3 mg/dL   Total Protein 6.2 (L) 6.5 - 8.1 g/dL   Albumin 3.4 (L) 3.5 - 5.0 g/dL   AST 70 (H) 15 - 41 U/L   ALT 90 (H) 0 - 44 U/L   Alkaline Phosphatase 102 38 - 126 U/L   Total Bilirubin 1.1 0.3 - 1.2 mg/dL   GFR calc non Af Amer 52 (L) >60 mL/min   GFR calc Af Amer >60 >60 mL/min   Anion gap 14 5 - 15    Comment: Performed at [facility], [address], [place], [place] [address]  CBC with Differential     Status: Abnormal   Collection Time: 01/31/18  5:08 PM  Result Value Ref Range   WBC 9.9 4.0 - 10.5 K/uL    Comment: WHITE COUNT CONFIRMED ON SMEAR   RBC 5.59 4.22 - 5.81 MIL/uL   Hemoglobin 16.7 13.0 - 17.0 g/dL   HCT 11.9 14.7 - 82.9 %   MCV 90.7 80.0 - 100.0 fL   MCH 29.9 26.0 - 34.0 pg   MCHC 32.9 30.0 - 36.0 g/dL   RDW 56.2 13.0 - 86.5 %   Platelets 180 150 - 400 K/uL   nRBC 0.0 0.0 - 0.2 %   Neutrophils Relative % 93 %   Neutro Abs 9.2 (H) 1.7 - 7.7 K/uL   Lymphocytes Relative 3 %   Lymphs Abs 0.3 (L) 0.7 - 4.0 K/uL   Monocytes Relative 2 %   Monocytes Absolute 0.2 0.1 - 1.0 K/uL   Eosinophils Relative 0 %   Eosinophils Absolute 0.0 0.0 - 0.5 K/uL   Basophils Relative 0 %   Basophils Absolute 0.0 0.0 - 0.1 K/uL   WBC Morphology INCREASED BANDS (>20% BANDS)     Comment: VACUOLATED NEUTROPHILS   Immature Granulocytes 2 %   Abs Immature Granulocytes 0.17 (H) 0.00 - 0.07 K/uL   Burr Cells PRESENT     Comment: Performed at [facility], [address], [place], [place] [address]  Troponin I - Once     Status: Abnormal   Collection Time: 01/31/18  5:08 PM  Result Value Ref Range   Troponin I 0.09 (HH) <0.03 ng/mL    Comment: CRITICAL RESULT CALLED TO, READ  BACK BY AND VERIFIED WITH: Gena FrayBRIAN JESSEE,RN [phone] @ 1939 BY Knute NeuJ SCOTTON Performed at [facility], [address] [place], [place] [address]   Urinalysis, Routine w reflex microscopic     Status: Abnormal   Collection Time: 01/31/18 11:15 PM  Result Value Ref Range   Color, Urine YELLOW YELLOW   APPearance CLEAR CLEAR   Specific Gravity, Urine 1.039 (H) 1.005 - 1.030   pH 5.0 5.0 - 8.0   Glucose, UA NEGATIVE NEGATIVE mg/dL   Hgb urine dipstick SMALL (A) NEGATIVE   Bilirubin Urine NEGATIVE NEGATIVE   Ketones, ur NEGATIVE NEGATIVE mg/dL   Protein, ur NEGATIVE NEGATIVE mg/dL   Nitrite NEGATIVE NEGATIVE   Leukocytes, UA NEGATIVE NEGATIVE   RBC / HPF 6-10 0 - 5 RBC/hpf   WBC, UA 0-5 0 - 5 WBC/hpf   Bacteria, UA NONE SEEN NONE SEEN   Squamous Epithelial / LPF 0-5 0 - 5   Mucus PRESENT    Hyaline Casts, UA PRESENT     Comment: Performed at [facility], [address] [place], [place] [address]  Troponin I - Now Then Q6H     Status: Abnormal   Collection Time: 02/01/18  1:32 AM  Result Value Ref Range   Troponin I 0.09 (HH) <0.03 ng/mL    Comment: CRITICAL VALUE NOTED.  VALUE IS CONSISTENT WITH PREVIOUSLY REPORTED AND CALLED VALUE. Performed at [facility], [address] [place], [place] [address]   CBC     Status: Abnormal   Collection Time: 02/01/18  1:32 AM  Result Value Ref Range   WBC 12.5 (H) 4.0 - 10.5 K/uL    Comment: WHITE COUNT CONFIRMED ON SMEAR   RBC 4.98 4.22 - 5.81 MIL/uL   Hemoglobin 15.4 13.0 - 17.0 g/dL   HCT 95.246.0 84.139.0 - 32.452.0 %   MCV 92.4 80.0 - 100.0 fL   MCH 30.9 26.0 - 34.0 pg   MCHC 33.5 30.0 - 36.0 g/dL   RDW 40.113.9 02.711.5 - 25.315.5 %   Platelets 164 150 - 400 K/uL   nRBC 0.0 0.0 - 0.2 %    Comment: Performed at [facility], [address] [place], [place] [address]  Troponin I - Now Then Q6H     Status: Abnormal   Collection Time: 02/01/18  5:11 AM  Result Value Ref Range   Troponin I 0.09 (HH) <0.03 ng/mL    Comment: CRITICAL VALUE NOTED.  VALUE IS CONSISTENT WITH PREVIOUSLY  REPORTED AND CALLED VALUE. Performed at [facility], [address] [place], [place] [address]   CBC     Status: Abnormal   Collection Time: 02/01/18  5:11 AM  Result Value Ref Range   WBC 11.8 (H) 4.0 - 10.5 K/uL   RBC 4.53 4.22 - 5.81 MIL/uL   Hemoglobin 13.9 13.0 - 17.0 g/dL   HCT 59.542.2 63.839.0 - 75.652.0 %   MCV 93.2 80.0 - 100.0 fL   MCH 30.7 26.0 - 34.0 pg   MCHC 32.9 30.0 - 36.0 g/dL   RDW 43.314.2 29.511.5 - 18.815.5 %   Platelets 143 (L) 150 - 400 K/uL   nRBC 0.0 0.0 - 0.2 %    Comment: Performed at [facility], [address] [place], [place] [address]  TSH     Status: None   Collection Time: 02/01/18  5:55 AM  Result Value Ref Range   TSH 2.219 0.350 - 4.500 uIU/mL  Comment: Performed by a 3rd Generation assay with a functional sensitivity of <=0.01 uIU/mL. Performed at [facility], [address], [place], [place] [address]   CBC     Status: Abnormal   Collection Time: 02/01/18  7:53 AM  Result Value Ref Range   WBC 11.4 (H) 4.0 - 10.5 K/uL   RBC 4.62 4.22 - 5.81 MIL/uL   Hemoglobin 14.3 13.0 - 17.0 g/dL   HCT 60.4 54.0 - 98.1 %   MCV 94.8 80.0 - 100.0 fL   MCH 31.0 26.0 - 34.0 pg   MCHC 32.6 30.0 - 36.0 g/dL   RDW 19.1 47.8 - 29.5 %   Platelets 143 (L) 150 - 400 K/uL   nRBC 0.0 0.0 - 0.2 %    Comment: Performed at [facility], [address] [place], [place] [address]  Comprehensive metabolic panel     Status: Abnormal   Collection Time: 02/01/18  7:54 AM  Result Value Ref Range   Sodium 135 135 - 145 mmol/L   Potassium 3.6 3.5 - 5.1 mmol/L   Chloride 104 98 - 111 mmol/L   CO2 23 22 - 32 mmol/L   Glucose, Bld 66 (L) 70 - 99 mg/dL   BUN 42 (H) 8 - 23 mg/dL   Creatinine, Ser 8.65 0.61 - 1.24 mg/dL   Calcium 7.8 (L) 8.9 - 10.3 mg/dL   Total Protein 5.6 (L) 6.5 - 8.1 g/dL   Albumin 3.2 (L) 3.5 - 5.0 g/dL   AST 46 (H) 15 - 41 U/L   ALT 64 (H) 0 - 44 U/L   Alkaline Phosphatase 84 38 - 126 U/L    Total Bilirubin 0.9 0.3 - 1.2 mg/dL   GFR calc non Af Amer 52 (L) >60 mL/min   GFR calc Af Amer 60 (L) >60 mL/min   Anion gap 8 5 - 15    Comment: Performed at [facility], [address] [place], [place] [address]  Protime-INR     Status: Abnormal   Collection Time: 02/01/18  7:54 AM  Result Value Ref Range   Prothrombin Time 17.1 (H) 11.4 - 15.2 seconds   INR 1.41     Comment: Performed at [facility], [address], [place], [place] [address]  Magnesium     Status: None   Collection Time: 02/01/18  7:54 AM  Result Value Ref Range   Magnesium 2.0 1.7 - 2.4 mg/dL    Comment: Performed at [facility], [address] [place], [place] [address]   Dg Chest 2 View  Result Date: 01/31/2018 CLINICAL DATA:  Upper abdominal pain EXAM: CHEST - 2 VIEW COMPARISON:  09/09/2017 FINDINGS: Small pleural effusions. No focal consolidation. Scarring in the right lower lobe. Stable cardiomediastinal silhouette. No pneumothorax. Lucency in the left upper quadrant thought to reflect gas within the gastric fundus. IMPRESSION: Small bilateral pleural effusions Electronically Signed   By: Jasmine Pang M.D.   On: 01/31/2018 18:03   Ct Abdomen Pelvis W Contrast  Result Date: 01/31/2018 CLINICAL DATA:  84 year old with acute abdominal pain. EXAM: CT ABDOMEN AND PELVIS WITH CONTRAST TECHNIQUE: Multidetector CT imaging of the abdomen and pelvis was performed using the standard protocol following bolus administration of intravenous contrast. CONTRAST:  ISOVUE-300 IOPAMIDOL (ISOVUE-300) INJECTION 61% COMPARISON:  CT 09/09/2017 FINDINGS: Lower chest: Unchanged ovoid density in the left lower lobe consistent with round atelectasis. Bibasilar scarring. Subpleural bleb in the right lower lobe small  right pleural effusion is new. There are coronary artery calcifications. Hepatobiliary: 15 mm cyst in the left lobe of the liver. Minimal gallbladder wall  thickening and adjacent stranding is likely reactive secondary to gastric/duodenal inflammation, similar to slightly improved from prior exam. No calcified gallstone. No biliary dilatation. Pancreas: Fatty atrophy.  No ductal dilatation or inflammation. Spleen: Normal in size without focal abnormality. Adrenals/Urinary Tract: Normal adrenal glands. No hydronephrosis or perinephric edema. Homogeneous renal enhancement. Absent renal excretion on delayed phase imaging suggesting underlying renal dysfunction. Symmetric age-related renal atrophy. 12 mm cyst from the lower right kidney. Urinary bladder is physiologically distended without wall thickening. Stomach/Bowel: Stomach distended with air-fluid level. Peri pyloric wall thickening of the distal stomach and duodenum with mucosal enhancement and perigastric/peri duodenal inflammation. Small foci of extraluminal air in the porta hepatis consistent perforation. Regional stranding and free fluid without organized abscess. Duodenal diverticulum about the fourth portion again seen without inflammation. Remaining small bowel is unremarkable. No obstruction. Mild distal descending and sigmoid diverticulosis without diverticulitis. Appendix is not confidently visualized. Vascular/Lymphatic: Aorta bi-iliac atherosclerosis. No aneurysm. Circumaortic left renal vein. No adenopathy. Reproductive: Prostatic calcifications. Other: Foci of air in the right upper quadrant of the abdomen in the porta hepatis consistent with bowel perforation. Right upper quadrant inflammatory change and free fluid without organized abscess. Small amount of free fluid tracks into the pelvis. Left inguinal canal contains fat and small amount of free fluid. Prior right inguinal hernia repair. Right scrotal hydroceles partially included. Musculoskeletal: Multilevel degenerative change throughout spine. Unchanged grade 1 retrolisthesis of L3 on L4, degenerative. There are no acute or suspicious osseous  abnormalities. IMPRESSION: 1. Findings highly suspicious for perforated peptic ulcer with small foci of extraluminal air in the porta hepatis and right upper quadrant, wall thickening of the peri pyloric stomach and proximal duodenum. No organized abscess. 2. Mild gallbladder wall thickening and adjacent stranding is likely reactive secondary to gastric/duodenal inflammation, similar to slightly improved from prior exam. 3. Colonic diverticulosis without diverticulitis. 4. Absent renal excretion on delayed phase imaging consistent with underlying renal dysfunction. 5. Small right pleural effusion. 6. Emphysema at the lung bases with round atelectasis in the left lower lobe. Emphysema (ICD10-J43.9). 7.  Aortic Atherosclerosis (ICD10-I70.0). Critical Value/emergent results were called by telephone at the time of interpretation on 01/31/2018 at 9:22 pm to Dr. Pricilla Loveless , who verbally acknowledged these results. Electronically Signed   By: Narda Rutherford M.D.   On: 01/31/2018 21:24   Dg Abd 2 Views  Result Date: 02/01/2018 CLINICAL DATA:  Perforated ulcer on recent CT EXAM: ABDOMEN - 2 VIEW COMPARISON:  01/31/2018 FINDINGS: Scattered large and small bowel gas is noted. Some early small-bowel dilatation is noted. This may represent an overall ileus as opposed to true small-bowel obstruction. Correlation with the physical exam is recommended. Contrast material is noted within the bladder from the recent CT examination. No definitive free air is seen. IMPRESSION: No free air identified. Generalized small bowel dilatation likely representing an ileus. Correlation with the physical exam is recommended. Continued follow-up is recommended. Electronically Signed   By: Alcide Clever M.D.   On: 02/01/2018 07:49   EKG Interpretation  Date/Time:  Wednesday January 31 2018 19:11:43 EST Ventricular Rate:  100 PR Interval:    QRS Duration: 141 QT Interval:  377 QTC Calculation: 487 R Axis:   -51 Text  Interpretation:  Junctional tachycardia RBBB and LAFB rate is faster compared to July 2019 Confirmed by Pricilla Loveless [phone]) on 01/31/2018  7:15:42 PM   Pending Labs Unresulted Labs (From admission, onward)    Start     Ordered   02/02/18 0500  CBC with Differential/Platelet  Tomorrow morning,   R     02/01/18 1103   02/02/18 0500  Comprehensive metabolic panel  Tomorrow morning,   R     02/01/18 1103   02/02/18 0500  Lactic acid, plasma  Tomorrow morning,   R     02/01/18 1103   01/31/18 2311  Troponin I - Now Then Q6H  Now then every 6 hours,   R     01/31/18 2318          Vitals/Pain Today's Vitals   02/01/18 1100 02/01/18 1130 02/01/18 1200 02/01/18 1330  BP: 104/63 (!) 105/45 (!) 110/49 (!) 137/57  Pulse: (!) 110 100 100 100  Resp: 15 (!) 24 14 (!) 24  Temp:      TempSrc:      SpO2: 97% 95% 96% 98%  Weight:      PainSc:        Isolation Precautions No active isolations  Medications Medications  iopamidol (ISOVUE-300) 61 % injection (has no administration in time range)  sodium chloride (PF) 0.9 % injection (has no administration in time range)  metroNIDAZOLE (FLAGYL) IVPB 500 mg (500 mg Intravenous New Bag/Given 02/01/18 1332)  morphine 2 MG/ML injection 1 mg (has no administration in time range)  ondansetron (ZOFRAN) injection 4 mg (has no administration in time range)  0.9 %  sodium chloride infusion ( Intravenous Stopped 02/01/18 1145)  pantoprazole (PROTONIX) 80 mg in sodium chloride 0.9 % 250 mL (0.32 mg/mL) infusion (8 mg/hr Intravenous New Bag/Given 02/01/18 0258)  pantoprazole (PROTONIX) injection 40 mg (has no administration in time range)  sucralfate (CARAFATE) 1 GM/10ML suspension 1 g (1 g Oral Not Given 02/01/18 1330)  acetaminophen (TYLENOL) tablet 650 mg (has no administration in time range)    Or  acetaminophen (TYLENOL) suppository 650 mg (has no administration in time range)  ipratropium-albuterol (DUONEB) 0.5-2.5 (3) MG/3ML nebulizer solution  3 mL (has no administration in time range)  albuterol (PROVENTIL) (2.5 MG/3ML) 0.083% nebulizer solution 2.5 mg (has no administration in time range)  ciprofloxacin (CIPRO) IVPB 400 mg (400 mg Intravenous New Bag/Given 02/01/18 1333)  potassium chloride 10 mEq in 100 mL IVPB (10 mEq Intravenous New Bag/Given 02/01/18 1331)  fentaNYL (SUBLIMAZE) injection 50 mcg (50 mcg Intravenous Given 01/31/18 1848)  ondansetron (ZOFRAN) injection 4 mg (4 mg Intravenous Given 01/31/18 1848)  sodium chloride 0.9 % bolus 500 mL (0 mLs Intravenous Stopped 02/01/18 0137)  iopamidol (ISOVUE-300) 61 % injection 100 mL (100 mLs Intravenous Contrast Given 01/31/18 2059)  fentaNYL (SUBLIMAZE) injection 50 mcg (50 mcg Intravenous Given 01/31/18 2054)  ciprofloxacin (CIPRO) IVPB 400 mg (0 mg Intravenous Stopped 02/01/18 0256)  metroNIDAZOLE (FLAGYL) IVPB 500 mg (0 mg Intravenous Stopped 02/01/18 0138)  sodium chloride 0.9 % bolus 1,000 mL (0 mLs Intravenous Stopped 02/01/18 0138)  sodium chloride 0.9 % bolus 1,000 mL (0 mLs Intravenous Stopped 02/01/18 0256)  pantoprazole (PROTONIX) 80 mg in sodium chloride 0.9 % 100 mL IVPB (0 mg Intravenous Stopped 02/01/18 0256)  lidocaine (XYLOCAINE) 2 % jelly 1 application (1 application Other Given 02/01/18 0126)  LORazepam (ATIVAN) injection 0.5 mg (0.5 mg Intravenous Given 02/01/18 0125)  lidocaine (XYLOCAINE) 2 % jelly (1 application  Given 02/01/18 0259)    Mobility non-ambulatory

## 2018-02-01 NOTE — ED Notes (Signed)
Pt moved to hospital comfortable floor  Bed

## 2018-02-01 NOTE — ED Notes (Signed)
Manuel HaringVickie Longbottom  [phone]  [phone] home

## 2018-02-01 NOTE — ED Notes (Signed)
Patient transported to 1226 by Tim, RN on telemetry and with IVF infusing.

## 2018-02-01 NOTE — ED Notes (Signed)
ATTEMPTED  Ng 18 PT COULD NOT TOLERATE  ATTEMPTED  Ng  16 PT COULD NOT TOLERATE  ATTEMPTED  Ng   14 PT VERBALIZE STOP  Notified hospital ist for ativan 0.5 ( made 4th attempt pt will not sit still and allow )

## 2018-02-01 NOTE — ED Notes (Signed)
Patient Transported to IR 

## 2018-02-01 NOTE — Progress Notes (Signed)
Pt became agitated and disoriented. Pt pulled out NG tube (placed in IR per physician order) and IV in left hand. Pt also attempting to pull out foley and get out of bed to urinate. RN applied mittens to PT and paged triad concerning NG tube. RN will continue to monitor. Family updated.

## 2018-02-01 NOTE — ED Notes (Signed)
Utilized 2 nurse and charge to help with NG unsuccessful

## 2018-02-01 NOTE — ED Notes (Signed)
Lab draw moved to 5am

## 2018-02-01 NOTE — Progress Notes (Signed)
Perforated peptic ulcer (HCC)  Subjective: Pt with no complaints, somewhat disoriented  Objective: Vital signs in last 24 hours: Temp:  [98 F (36.7 C)] 98 F (36.7 C) (11/27 1633) Pulse Rate:  [66-128] 100 (11/28 1130) Resp:  [13-30] 24 (11/28 1130) BP: (91-126)/(39-81) 105/45 (11/28 1130) SpO2:  [87 %-97 %] 95 % (11/28 1130) Weight:  [75.8 kg] 75.8 kg (11/28 0201)    Intake/Output from previous day: 11/27 0701 - 11/28 0700 In: 100 [IV Piggyback:100] Out: -  Intake/Output this shift: No intake/output data recorded.  General appearance: cooperative and fatigued Cardio: regular, moderately tachycardic  GI: normal findings: soft, distended  Lab Results:  Results for orders placed or performed during the hospital encounter of 01/31/18 (from the past 24 hour(s))  Lipase, blood     Status: Abnormal   Collection Time: 01/31/18  5:08 PM  Result Value Ref Range   Lipase 59 (H) 11 - 51 U/L  Comprehensive metabolic panel     Status: Abnormal   Collection Time: 01/31/18  5:08 PM  Result Value Ref Range   Sodium 133 (L) 135 - 145 mmol/L   Potassium 3.6 3.5 - 5.1 mmol/L   Chloride 98 98 - 111 mmol/L   CO2 21 (L) 22 - 32 mmol/L   Glucose, Bld 73 70 - 99 mg/dL   BUN 38 (H) 8 - 23 mg/dL   Creatinine, Ser 1.611.20 0.61 - 1.24 mg/dL   Calcium 8.3 (L) 8.9 - 10.3 mg/dL   Total Protein 6.2 (L) 6.5 - 8.1 g/dL   Albumin 3.4 (L) 3.5 - 5.0 g/dL   AST 70 (H) 15 - 41 U/L   ALT 90 (H) 0 - 44 U/L   Alkaline Phosphatase 102 38 - 126 U/L   Total Bilirubin 1.1 0.3 - 1.2 mg/dL   GFR calc non Af Amer 52 (L) >60 mL/min   GFR calc Af Amer >60 >60 mL/min   Anion gap 14 5 - 15  CBC with Differential     Status: Abnormal   Collection Time: 01/31/18  5:08 PM  Result Value Ref Range   WBC 9.9 4.0 - 10.5 K/uL   RBC 5.59 4.22 - 5.81 MIL/uL   Hemoglobin 16.7 13.0 - 17.0 g/dL   HCT 09.650.7 04.539.0 - 40.952.0 %   MCV 90.7 80.0 - 100.0 fL   MCH 29.9 26.0 - 34.0 pg   MCHC 32.9 30.0 - 36.0 g/dL   RDW 81.113.9 91.411.5 -  78.215.5 %   Platelets 180 150 - 400 K/uL   nRBC 0.0 0.0 - 0.2 %   Neutrophils Relative % 93 %   Neutro Abs 9.2 (H) 1.7 - 7.7 K/uL   Lymphocytes Relative 3 %   Lymphs Abs 0.3 (L) 0.7 - 4.0 K/uL   Monocytes Relative 2 %   Monocytes Absolute 0.2 0.1 - 1.0 K/uL   Eosinophils Relative 0 %   Eosinophils Absolute 0.0 0.0 - 0.5 K/uL   Basophils Relative 0 %   Basophils Absolute 0.0 0.0 - 0.1 K/uL   WBC Morphology INCREASED BANDS (>20% BANDS)    Immature Granulocytes 2 %   Abs Immature Granulocytes 0.17 (H) 0.00 - 0.07 K/uL   Burr Cells PRESENT   Troponin I - Once     Status: Abnormal   Collection Time: 01/31/18  5:08 PM  Result Value Ref Range   Troponin I 0.09 (HH) <0.03 ng/mL  Urinalysis, Routine w reflex microscopic     Status: Abnormal   Collection Time: 01/31/18  11:15 PM  Result Value Ref Range   Color, Urine YELLOW YELLOW   APPearance CLEAR CLEAR   Specific Gravity, Urine 1.039 (H) 1.005 - 1.030   pH 5.0 5.0 - 8.0   Glucose, UA NEGATIVE NEGATIVE mg/dL   Hgb urine dipstick SMALL (A) NEGATIVE   Bilirubin Urine NEGATIVE NEGATIVE   Ketones, ur NEGATIVE NEGATIVE mg/dL   Protein, ur NEGATIVE NEGATIVE mg/dL   Nitrite NEGATIVE NEGATIVE   Leukocytes, UA NEGATIVE NEGATIVE   RBC / HPF 6-10 0 - 5 RBC/hpf   WBC, UA 0-5 0 - 5 WBC/hpf   Bacteria, UA NONE SEEN NONE SEEN   Squamous Epithelial / LPF 0-5 0 - 5   Mucus PRESENT    Hyaline Casts, UA PRESENT   Troponin I - Now Then Q6H     Status: Abnormal   Collection Time: 02/01/18  1:32 AM  Result Value Ref Range   Troponin I 0.09 (HH) <0.03 ng/mL  CBC     Status: Abnormal   Collection Time: 02/01/18  1:32 AM  Result Value Ref Range   WBC 12.5 (H) 4.0 - 10.5 K/uL   RBC 4.98 4.22 - 5.81 MIL/uL   Hemoglobin 15.4 13.0 - 17.0 g/dL   HCT 40.9 81.1 - 91.4 %   MCV 92.4 80.0 - 100.0 fL   MCH 30.9 26.0 - 34.0 pg   MCHC 33.5 30.0 - 36.0 g/dL   RDW 78.2 95.6 - 21.3 %   Platelets 164 150 - 400 K/uL   nRBC 0.0 0.0 - 0.2 %  Troponin I - Now Then  Q6H     Status: Abnormal   Collection Time: 02/01/18  5:11 AM  Result Value Ref Range   Troponin I 0.09 (HH) <0.03 ng/mL  CBC     Status: Abnormal   Collection Time: 02/01/18  5:11 AM  Result Value Ref Range   WBC 11.8 (H) 4.0 - 10.5 K/uL   RBC 4.53 4.22 - 5.81 MIL/uL   Hemoglobin 13.9 13.0 - 17.0 g/dL   HCT 08.6 57.8 - 46.9 %   MCV 93.2 80.0 - 100.0 fL   MCH 30.7 26.0 - 34.0 pg   MCHC 32.9 30.0 - 36.0 g/dL   RDW 62.9 52.8 - 41.3 %   Platelets 143 (L) 150 - 400 K/uL   nRBC 0.0 0.0 - 0.2 %  TSH     Status: None   Collection Time: 02/01/18  5:55 AM  Result Value Ref Range   TSH 2.219 0.350 - 4.500 uIU/mL  CBC     Status: Abnormal   Collection Time: 02/01/18  7:53 AM  Result Value Ref Range   WBC 11.4 (H) 4.0 - 10.5 K/uL   RBC 4.62 4.22 - 5.81 MIL/uL   Hemoglobin 14.3 13.0 - 17.0 g/dL   HCT 24.4 01.0 - 27.2 %   MCV 94.8 80.0 - 100.0 fL   MCH 31.0 26.0 - 34.0 pg   MCHC 32.6 30.0 - 36.0 g/dL   RDW 53.6 64.4 - 03.4 %   Platelets 143 (L) 150 - 400 K/uL   nRBC 0.0 0.0 - 0.2 %  Comprehensive metabolic panel     Status: Abnormal   Collection Time: 02/01/18  7:54 AM  Result Value Ref Range   Sodium 135 135 - 145 mmol/L   Potassium 3.6 3.5 - 5.1 mmol/L   Chloride 104 98 - 111 mmol/L   CO2 23 22 - 32 mmol/L   Glucose, Bld 66 (L) 70 - 99  mg/dL   BUN 42 (H) 8 - 23 mg/dL   Creatinine, Ser 1.61 0.61 - 1.24 mg/dL   Calcium 7.8 (L) 8.9 - 10.3 mg/dL   Total Protein 5.6 (L) 6.5 - 8.1 g/dL   Albumin 3.2 (L) 3.5 - 5.0 g/dL   AST 46 (H) 15 - 41 U/L   ALT 64 (H) 0 - 44 U/L   Alkaline Phosphatase 84 38 - 126 U/L   Total Bilirubin 0.9 0.3 - 1.2 mg/dL   GFR calc non Af Amer 52 (L) >60 mL/min   GFR calc Af Amer 60 (L) >60 mL/min   Anion gap 8 5 - 15  Protime-INR     Status: Abnormal   Collection Time: 02/01/18  7:54 AM  Result Value Ref Range   Prothrombin Time 17.1 (H) 11.4 - 15.2 seconds   INR 1.41   Magnesium     Status: None   Collection Time: 02/01/18  7:54 AM  Result Value Ref  Range   Magnesium 2.0 1.7 - 2.4 mg/dL     Studies/Results Radiology     MEDS, Scheduled . [START ON 02/04/2018] pantoprazole  40 mg Intravenous Q12H  . sucralfate  1 g Oral Q6H     Assessment: Perforated peptic ulcer (HCC) Pt with ileus and probably contained perforated ulcer.    Plan: Currently hemodynamically stable. No surgical intervention warranted at this time  Insert foley Unable to insert NG on the floor. to be placed in fluoro.  D/w Dr Lowella Dandy Will follow   LOS: 1 day    Vanita Panda, MD [facility], [place] [phone]   02/01/2018 12:25 PM

## 2018-02-01 NOTE — Progress Notes (Addendum)
PROGRESS NOTE  Manuel Reilly ZOX:[id number] DOB: 04-30-1923 DOA: 01/31/2018 PCP: Myrlene Brokerrawford, Elizabeth A, MD  HPI/Recap of past 24 hours:  Abdomen distended, he could not remember whether he has pass gas  No vomiting, he denies pain currently, no fever Two daughters are at bedside  Assessment/Plan: Principal Problem:   Perforated peptic ulcer (HCC) Active Problems:   COPD (chronic obstructive pulmonary disease) (HCC)   Elevated troponin   Pleural effusion   HTN (hypertension)  Perforated peptic ulcer/ileus  -General surgery consulted, conservative management with NG suction/ PPIdrip/cipro/flagyl/ivf -Keep k >4, keep mag>2 -will follow general surgery recommendation  New onset of A. Fib -Not a candidate for anticoagulation due to perforated peptic ulcer -Rate control -Echocardiogram ordered   Daughter states patient lives by himself, still drives and go shopping himself.  Code Status:  I have discussed code status with patient with two daughters at bedside, he clearly states he does not want to have CPR , he does not want to go on the ventilator if he were to die for sickness Daughters and patient confirmed his code status is DNR  Family Communication: patient and two daughters at bedside  Disposition Plan: remain in stepdown   Consultants:  General surgery  Critical care  cardiology  Procedures:  none  Antibiotics:  As above   Objective: BP (!) 115/54   Pulse (!) 109   Temp 98 F (36.7 C) (Oral)   Resp (!) 28   Wt 75.8 kg   SpO2 93%   BMI 23.96 kg/m  No intake or output data in the 24 hours ending 02/01/18 0751 Filed Weights   02/01/18 0201  Weight: 75.8 kg    Exam: Patient is examined daily including today on 02/01/2018, exams remain the same as of yesterday except that has changed    General:  NAD, appear younger than staged age  Cardiovascular: IRRR  Respiratory: CTABL  Abdomen: distended, decreased bowel sounds,  nontender  Musculoskeletal: No Edema  Neuro: alert, oriented   Data Reviewed: Basic Metabolic Panel: Recent Labs  Lab 01/31/18 1708  NA 133*  K 3.6  CL 98  CO2 21*  GLUCOSE 73  BUN 38*  CREATININE 1.20  CALCIUM 8.3*   Liver Function Tests: Recent Labs  Lab 01/31/18 1708  AST 70*  ALT 90*  ALKPHOS 102  BILITOT 1.1  PROT 6.2*  ALBUMIN 3.4*   Recent Labs  Lab 01/31/18 1708  LIPASE 59*   No results for input(s): AMMONIA in the last 168 hours. CBC: Recent Labs  Lab 01/31/18 1708 02/01/18 0132 02/01/18 0511  WBC 9.9 12.5* 11.8*  NEUTROABS 9.2*  --   --   HGB 16.7 15.4 13.9  HCT 50.7 46.0 42.2  MCV 90.7 92.4 93.2  PLT 180 164 143*   Cardiac Enzymes:   Recent Labs  Lab 01/31/18 1708 02/01/18 0132 02/01/18 0511  TROPONINI 0.09* 0.09* 0.09*   BNP (last 3 results) No results for input(s): BNP in the last 8760 hours.  ProBNP (last 3 results) No results for input(s): PROBNP in the last 8760 hours.  CBG: No results for input(s): GLUCAP in the last 168 hours.  No results found for this or any previous visit (from the past 240 hour(s)).   Studies: Dg Chest 2 View  Result Date: 01/31/2018 CLINICAL DATA:  Upper abdominal pain EXAM: CHEST - 2 VIEW COMPARISON:  09/09/2017 FINDINGS: Small pleural effusions. No focal consolidation. Scarring in the right lower lobe. Stable cardiomediastinal silhouette. No pneumothorax. Lucency  in the left upper quadrant thought to reflect gas within the gastric fundus. IMPRESSION: Small bilateral pleural effusions Electronically Signed   By: Jasmine Pang M.D.   On: 01/31/2018 18:03   Ct Abdomen Pelvis W Contrast  Result Date: 01/31/2018 CLINICAL DATA:  84 year old with acute abdominal pain. EXAM: CT ABDOMEN AND PELVIS WITH CONTRAST TECHNIQUE: Multidetector CT imaging of the abdomen and pelvis was performed using the standard protocol following bolus administration of intravenous contrast. CONTRAST:  ISOVUE-300 IOPAMIDOL  (ISOVUE-300) INJECTION 61% COMPARISON:  CT 09/09/2017 FINDINGS: Lower chest: Unchanged ovoid density in the left lower lobe consistent with round atelectasis. Bibasilar scarring. Subpleural bleb in the right lower lobe small right pleural effusion is new. There are coronary artery calcifications. Hepatobiliary: 15 mm cyst in the left lobe of the liver. Minimal gallbladder wall thickening and adjacent stranding is likely reactive secondary to gastric/duodenal inflammation, similar to slightly improved from prior exam. No calcified gallstone. No biliary dilatation. Pancreas: Fatty atrophy.  No ductal dilatation or inflammation. Spleen: Normal in size without focal abnormality. Adrenals/Urinary Tract: Normal adrenal glands. No hydronephrosis or perinephric edema. Homogeneous renal enhancement. Absent renal excretion on delayed phase imaging suggesting underlying renal dysfunction. Symmetric age-related renal atrophy. 12 mm cyst from the lower right kidney. Urinary bladder is physiologically distended without wall thickening. Stomach/Bowel: Stomach distended with air-fluid level. Peri pyloric wall thickening of the distal stomach and duodenum with mucosal enhancement and perigastric/peri duodenal inflammation. Small foci of extraluminal air in the porta hepatis consistent perforation. Regional stranding and free fluid without organized abscess. Duodenal diverticulum about the fourth portion again seen without inflammation. Remaining small bowel is unremarkable. No obstruction. Mild distal descending and sigmoid diverticulosis without diverticulitis. Appendix is not confidently visualized. Vascular/Lymphatic: Aorta bi-iliac atherosclerosis. No aneurysm. Circumaortic left renal vein. No adenopathy. Reproductive: Prostatic calcifications. Other: Foci of air in the right upper quadrant of the abdomen in the porta hepatis consistent with bowel perforation. Right upper quadrant inflammatory change and free fluid without  organized abscess. Small amount of free fluid tracks into the pelvis. Left inguinal canal contains fat and small amount of free fluid. Prior right inguinal hernia repair. Right scrotal hydroceles partially included. Musculoskeletal: Multilevel degenerative change throughout spine. Unchanged grade 1 retrolisthesis of L3 on L4, degenerative. There are no acute or suspicious osseous abnormalities. IMPRESSION: 1. Findings highly suspicious for perforated peptic ulcer with small foci of extraluminal air in the porta hepatis and right upper quadrant, wall thickening of the peri pyloric stomach and proximal duodenum. No organized abscess. 2. Mild gallbladder wall thickening and adjacent stranding is likely reactive secondary to gastric/duodenal inflammation, similar to slightly improved from prior exam. 3. Colonic diverticulosis without diverticulitis. 4. Absent renal excretion on delayed phase imaging consistent with underlying renal dysfunction. 5. Small right pleural effusion. 6. Emphysema at the lung bases with round atelectasis in the left lower lobe. Emphysema (ICD10-J43.9). 7.  Aortic Atherosclerosis (ICD10-I70.0). Critical Value/emergent results were called by telephone at the time of interpretation on 01/31/2018 at 9:22 pm to Dr. Pricilla Loveless , who verbally acknowledged these results. Electronically Signed   By: Narda Rutherford M.D.   On: 01/31/2018 21:24    Scheduled Meds: . iopamidol      . [START ON 02/04/2018] pantoprazole  40 mg Intravenous Q12H  . sodium chloride (PF)      . sucralfate  1 g Oral Q6H    Continuous Infusions: . sodium chloride Stopped (02/01/18 0711)  . ciprofloxacin    . metronidazole Stopped (02/01/18  0710)  . pantoprozole (PROTONIX) infusion 8 mg/hr (02/01/18 0258)     Time spent: I have personally reviewed and interpreted on  02/01/2018 daily labs, tele strips, imagings as discussed above under date review session and assessment and plans.  I reviewed all  nursing notes, pharmacy notes, consultant notes,  vitals, pertinent old records  I have discussed plan of care as described above with RN , patient and family on 02/01/2018   Albertine Grates MD, PhD  [facility] Pager [phone]. If 7PM-7AM, please contact night-coverage at [email or web address], password [facility] 02/01/2018, 7:51 AM  LOS: 1 day

## 2018-02-01 NOTE — ED Notes (Signed)
Attempt x2 with #16 and #14 NGT to left and right nares-patient becomes acutely agitated and declines further procedure. Patient states he is aware he has a hole in his stomach and he needs the NGT-patient states "I just can't stand it".

## 2018-02-02 ENCOUNTER — Inpatient Hospital Stay (HOSPITAL_COMMUNITY): Payer: Medicare Other

## 2018-02-02 ENCOUNTER — Ambulatory Visit: Payer: Medicare Other | Admitting: Family

## 2018-02-02 DIAGNOSIS — I4891 Unspecified atrial fibrillation: Secondary | ICD-10-CM

## 2018-02-02 LAB — COMPREHENSIVE METABOLIC PANEL
ALT: 44 U/L (ref 0–44)
AST: 27 U/L (ref 15–41)
Albumin: 2.6 g/dL — ABNORMAL LOW (ref 3.5–5.0)
Alkaline Phosphatase: 83 U/L (ref 38–126)
Anion gap: 9 (ref 5–15)
BUN: 32 mg/dL — AB (ref 8–23)
CO2: 20 mmol/L — ABNORMAL LOW (ref 22–32)
CREATININE: 0.89 mg/dL (ref 0.61–1.24)
Calcium: 7.7 mg/dL — ABNORMAL LOW (ref 8.9–10.3)
Chloride: 105 mmol/L (ref 98–111)
GFR calc Af Amer: >60 mL/min (ref >60)
GFR calc non Af Amer: >60 mL/min (ref >60)
Glucose, Bld: 116 mg/dL — ABNORMAL HIGH (ref 70–99)
Potassium: 3.6 mmol/L (ref 3.5–5.1)
Sodium: 134 mmol/L — ABNORMAL LOW (ref 135–145)
Total Bilirubin: 1.1 mg/dL (ref 0.3–1.2)
Total Protein: 5 g/dL — ABNORMAL LOW (ref 6.5–8.1)

## 2018-02-02 LAB — CBC WITH DIFFERENTIAL/PLATELET
Abs Immature Granulocytes: 0.07 10*3/uL (ref 0.00–0.07)
Basophils Absolute: 0 10*3/uL (ref 0.0–0.1)
Basophils Relative: 0 %
Eosinophils Absolute: 0 10*3/uL (ref 0.0–0.5)
Eosinophils Relative: 0 %
HCT: 41.8 % (ref 39.0–52.0)
Hemoglobin: 13.6 g/dL (ref 13.0–17.0)
Immature Granulocytes: 1 %
Lymphocytes Relative: 4 %
Lymphs Abs: 0.4 10*3/uL — ABNORMAL LOW (ref 0.7–4.0)
MCH: 30.6 pg (ref 26.0–34.0)
MCHC: 32.5 g/dL (ref 30.0–36.0)
MCV: 93.9 fL (ref 80.0–100.0)
MONO ABS: 0.5 10*3/uL (ref 0.1–1.0)
MONOS PCT: 6 %
Neutro Abs: 8 10*3/uL — ABNORMAL HIGH (ref 1.7–7.7)
Neutrophils Relative %: 89 %
Platelets: 127 10*3/uL — ABNORMAL LOW (ref 150–400)
RBC: 4.45 MIL/uL (ref 4.22–5.81)
RDW: 14.6 % (ref 11.5–15.5)
WBC Morphology: INCREASED
WBC: 9 10*3/uL (ref 4.0–10.5)
nRBC: 0 % (ref 0.0–0.2)

## 2018-02-02 LAB — LACTIC ACID, PLASMA: Lactic Acid, Venous: 1.7 mmol/L (ref 0.5–1.9)

## 2018-02-02 LAB — ECHOCARDIOGRAM COMPLETE: Weight: 2672 oz

## 2018-02-02 MED ORDER — MORPHINE SULFATE (PF) 2 MG/ML IV SOLN
0.5000 mg | INTRAVENOUS | Status: DC | PRN
  Administered 2018-02-09 – 2018-02-10 (×4): 0.5 mg via INTRAVENOUS
  Filled 2018-02-02 (×5): qty 1

## 2018-02-02 MED ORDER — METOPROLOL TARTRATE 5 MG/5ML IV SOLN
5.0000 mg | Freq: Once | INTRAVENOUS | Status: AC
  Administered 2018-02-02: 5 mg via INTRAVENOUS
  Filled 2018-02-02: qty 5

## 2018-02-02 MED ORDER — POTASSIUM CHLORIDE 10 MEQ/100ML IV SOLN
10.0000 meq | INTRAVENOUS | Status: AC
  Administered 2018-02-02 (×4): 10 meq via INTRAVENOUS
  Filled 2018-02-02 (×4): qty 100

## 2018-02-02 MED ORDER — DILTIAZEM HCL-DEXTROSE 100-5 MG/100ML-% IV SOLN (PREMIX)
5.0000 mg/h | INTRAVENOUS | Status: DC
  Administered 2018-02-02: 5 mg/h via INTRAVENOUS
  Filled 2018-02-02 (×3): qty 100

## 2018-02-02 MED ORDER — SODIUM CHLORIDE 0.9 % IV BOLUS
500.0000 mL | Freq: Once | INTRAVENOUS | Status: AC
  Administered 2018-02-02: 500 mL via INTRAVENOUS

## 2018-02-02 MED ORDER — DIGOXIN 0.25 MG/ML IJ SOLN
0.1250 mg | Freq: Once | INTRAMUSCULAR | Status: AC
  Administered 2018-02-02: 0.125 mg via INTRAVENOUS
  Filled 2018-02-02: qty 0.5

## 2018-02-02 MED ORDER — KCL IN DEXTROSE-NACL 10-5-0.45 MEQ/L-%-% IV SOLN
INTRAVENOUS | Status: DC
  Administered 2018-02-02: 12:00:00 via INTRAVENOUS
  Filled 2018-02-02 (×2): qty 1000

## 2018-02-02 NOTE — Progress Notes (Signed)
While on the phone with MD. Patient went into afib RVR. New orders given.

## 2018-02-02 NOTE — Progress Notes (Signed)
PROGRESS NOTE  Manuel Reilly ZOX:[id number] DOB: 09/17/1923 DOA: 01/31/2018 PCP: [name], MD  HPI/Recap of past 24 hours:   Pulled out ng last night he could not remember whether he has pass gas  RN report patient has one bm last night No vomiting, he denies pain currently, no fever  Remain in afib, denies chest pain, he is on 2liter o2  Assessment/Plan: Principal Problem:   Perforated peptic ulcer (HCC) Active Problems:   COPD (chronic obstructive pulmonary disease) (HCC)   Elevated troponin   Pleural effusion   HTN (hypertension)  Perforated peptic ulcer/ileus  -General surgery consulted, conservative management with NG suction/ PPIdrip/cipro/flagyl/ivf (he pulled out ng last night) -Keep k >4, keep mag>2 -will follow general surgery recommendation  New onset of A. Fib -Not a candidate for anticoagulation due to perforated peptic ulcer -Rate control with cardizem drip -Echocardiogram ordered -cardiology consulted   Daughter states patient lives by himself, still drives and go shopping himself prior to this  Code Status: DNR  Family Communication: patient   Disposition Plan: remain in stepdown   Consultants:  General surgery  Critical care  cardiology  Procedures:  none  Antibiotics:  As above   Objective: BP (!) 128/44   Pulse 81   Temp 98.2 F (36.8 C) (Oral)   Resp 18   Wt 75.8 kg   SpO2 96%   BMI 23.96 kg/m   Intake/Output Summary (Last 24 hours) at 02/02/2018 1059 Last data filed at 02/02/2018 1049 Gross per 24 hour  Intake 916.15 ml  Output 1250 ml  Net -333.85 ml   Filed Weights   02/01/18 0201  Weight: 75.8 kg    Exam: Patient is examined daily including today on 02/02/2018, exams remain the same as of yesterday except that has changed    General:  NAD, appear younger than staged age  Cardiovascular: IRRR  Respiratory: CTABL  Abdomen: distended, decreased bowel sounds,  nontender  Musculoskeletal: No Edema  Neuro: alert, oriented   Data Reviewed: Basic Metabolic Panel: Recent Labs  Lab 01/31/18 1708 02/01/18 0754 02/02/18 0324  NA 133* 135 134*  K 3.6 3.6 3.6  CL 98 104 105  CO2 21* 23 20*  GLUCOSE 73 66* 116*  BUN 38* 42* 32*  CREATININE 1.20 1.21 0.89  CALCIUM 8.3* 7.8* 7.7*  MG  --  2.0  --    Liver Function Tests: Recent Labs  Lab 01/31/18 1708 02/01/18 0754 02/02/18 0324  AST 70* 46* 27  ALT 90* 64* 44  ALKPHOS 102 84 83  BILITOT 1.1 0.9 1.1  PROT 6.2* 5.6* 5.0*  ALBUMIN 3.4* 3.2* 2.6*   Recent Labs  Lab 01/31/18 1708  LIPASE 59*   No results for input(s): AMMONIA in the last 168 hours. CBC: Recent Labs  Lab 01/31/18 1708 02/01/18 0132 02/01/18 0511 02/01/18 0753 02/02/18 0324  WBC 9.9 12.5* 11.8* 11.4* 9.0  NEUTROABS 9.2*  --   --   --  8.0*  HGB 16.7 15.4 13.9 14.3 13.6  HCT 50.7 46.0 42.2 43.8 41.8  MCV 90.7 92.4 93.2 94.8 93.9  PLT 180 164 143* 143* 127*   Cardiac Enzymes:   Recent Labs  Lab 01/31/18 1708 02/01/18 0132 02/01/18 0511 02/01/18 1508  TROPONINI 0.09* 0.09* 0.09* 0.14*   BNP (last 3 results) No results for input(s): BNP in the last 8760 hours.  ProBNP (last 3 results) No results for input(s): PROBNP in the last 8760 hours.  CBG: No results for  input(s): GLUCAP in the last 168 hours.  Recent Results (from the past 240 hour(s))  MRSA PCR Screening     Status: None   Collection Time: 02/01/18  3:00 PM  Result Value Ref Range Status   MRSA by PCR NEGATIVE NEGATIVE Final    Comment:        The GeneXpert MRSA Assay (FDA approved for NASAL specimens only), is one component of a comprehensive MRSA colonization surveillance program. It is not intended to diagnose MRSA infection nor to guide or monitor treatment for MRSA infections. Performed at [facility], [address] [place], [place] [address]      Studies: Karmen BongoDg Naso G Tube Plc W/fl W/rad  Result  Date: 02/01/2018 CLINICAL DATA:  84 year old with a perforated peptic ulcer. Patient needs a nasogastric tube and unable to place without imaging. EXAM: NASO G TUBE PLACEMENT WITH FL AND WITH RAD CONTRAST:  None FLUOROSCOPY TIME:  Fluoroscopy Time:  1 minutes and 16 seconds Radiation Exposure Index (if provided by the fluoroscopic device): 11.5 mGy COMPARISON:  None. FINDINGS: Lidocaine gel was placed in both nostrils using cotton swabs. Nasogastric tube was advanced down the left nostril with the patient was sitting upright. Once the tube was thought to be within the esophagus, the patient was moved on to the fluoroscopic table. The tube was advanced down to the GE junction. It was difficult to advance the catheter beyond the GE junction and fluoroscopy demonstrate that the tube was coiling in the patient's mouth. Stiff Amplatz wire was advanced through the NG tube and this helped advance the catheter into the stomach. Catheter was secured to the skin with tape. Nasogastric tube tip confirmed in the gastric fundus region with fluoroscopy. Fluoroscopic images were taken and saved for this procedure. IMPRESSION: Successful placement of a nasogastric tube with fluoroscopic guidance. Electronically Signed   By: Richarda OverlieAdam  Henn M.D.   On: 02/01/2018 13:48    Scheduled Meds: . [START ON 02/04/2018] pantoprazole  40 mg Intravenous Q12H  . sucralfate  1 g Oral Q6H    Continuous Infusions: . ciprofloxacin Stopped (02/02/18 0210)  . dextrose 5 % and 0.45 % NaCl with KCl 10 mEq/L    . dextrose 5 % and 0.45% NaCl 75 mL/hr at 02/02/18 1000  . metronidazole Stopped (02/02/18 0604)  . pantoprozole (PROTONIX) infusion 8 mg/hr (02/01/18 2246)  . potassium chloride 10 mEq (02/02/18 1049)     Time spent: 35mins I have personally reviewed and interpreted on  02/02/2018 daily labs, tele strips, imagings as discussed above under date review session and assessment and plans.  I reviewed all nursing notes, pharmacy notes,  consultant notes,  vitals, pertinent old records  I have discussed plan of care as described above with RN , patient on 02/02/2018   Albertine GratesFang Devlin Brink MD, PhD  [facility] Pager [phone]. If 7PM-7AM, please contact night-coverage at [email or web address], password [email or web address] 02/02/2018, 10:59 AM  LOS: 2 days

## 2018-02-02 NOTE — Progress Notes (Addendum)
Unaware if Lactic Acid of 1.7 addressed during night shift. MD called and new orders.

## 2018-02-02 NOTE — Progress Notes (Signed)
2 Days Post-Op   Subjective/Chief Complaint: Pt pulled NGT out last night No n/v States he abdomen feels better   Objective: Vital signs in last 24 hours: Temp:  [97.5 F (36.4 C)-98.6 F (37 C)] 97.8 F (36.6 C) (11/29 0400) Pulse Rate:  [79-127] 83 (11/29 0600) Resp:  [14-29] 17 (11/29 0600) BP: (83-137)/(38-63) 100/40 (11/29 0600) SpO2:  [87 %-100 %] 96 % (11/29 0600)    Intake/Output from previous day: 11/28 0701 - 11/29 0700 In: 916.2 [I.V.:234.7; IV Piggyback:681.5] Out: 1275 [Urine:1075; Emesis/NG output:200] Intake/Output this shift: No intake/output data recorded.  Constitutional: No acute distress, conversant, appears states age. Eyes: Anicteric sclerae, moist conjunctiva, no lid lag Lungs: Clear to auscultation bilaterally, normal respiratory effort CV: regular rate and rhythm, no murmurs, no peripheral edema, pedal pulses 2+ GI: Soft, no masses or hepatosplenomegaly,  Min tender to palpation RUQ Skin: No rashes, palpation reveals normal turgor Psychiatric: appropriate judgment and insight, oriented to person, place, and time    Lab Results:  Recent Labs    02/01/18 0753 02/02/18 0324  WBC 11.4* 9.0  HGB 14.3 13.6  HCT 43.8 41.8  PLT 143* 127*   BMET Recent Labs    02/01/18 0754 02/02/18 0324  NA 135 134*  K 3.6 3.6  CL 104 105  CO2 23 20*  GLUCOSE 66* 116*  BUN 42* 32*  CREATININE 1.21 0.89  CALCIUM 7.8* 7.7*   PT/INR Recent Labs    02/01/18 0754  LABPROT 17.1*  INR 1.41   ABG No results for input(s): PHART, HCO3 in the last 72 hours.  Invalid input(s): PCO2, PO2  Studies/Results: Dg Chest 2 View  Result Date: 01/31/2018 CLINICAL DATA:  Upper abdominal pain EXAM: CHEST - 2 VIEW COMPARISON:  09/09/2017 FINDINGS: Small pleural effusions. No focal consolidation. Scarring in the right lower lobe. Stable cardiomediastinal silhouette. No pneumothorax. Lucency in the left upper quadrant thought to reflect gas within the gastric fundus.  IMPRESSION: Small bilateral pleural effusions Electronically Signed   By: Jasmine Pang M.D.   On: 01/31/2018 18:03   Ct Abdomen Pelvis W Contrast  Result Date: 01/31/2018 CLINICAL DATA:  84 year old with acute abdominal pain. EXAM: CT ABDOMEN AND PELVIS WITH CONTRAST TECHNIQUE: Multidetector CT imaging of the abdomen and pelvis was performed using the standard protocol following bolus administration of intravenous contrast. CONTRAST:  ISOVUE-300 IOPAMIDOL (ISOVUE-300) INJECTION 61% COMPARISON:  CT 09/09/2017 FINDINGS: Lower chest: Unchanged ovoid density in the left lower lobe consistent with round atelectasis. Bibasilar scarring. Subpleural bleb in the right lower lobe small right pleural effusion is new. There are coronary artery calcifications. Hepatobiliary: 15 mm cyst in the left lobe of the liver. Minimal gallbladder wall thickening and adjacent stranding is likely reactive secondary to gastric/duodenal inflammation, similar to slightly improved from prior exam. No calcified gallstone. No biliary dilatation. Pancreas: Fatty atrophy.  No ductal dilatation or inflammation. Spleen: Normal in size without focal abnormality. Adrenals/Urinary Tract: Normal adrenal glands. No hydronephrosis or perinephric edema. Homogeneous renal enhancement. Absent renal excretion on delayed phase imaging suggesting underlying renal dysfunction. Symmetric age-related renal atrophy. 12 mm cyst from the lower right kidney. Urinary bladder is physiologically distended without wall thickening. Stomach/Bowel: Stomach distended with air-fluid level. Peri pyloric wall thickening of the distal stomach and duodenum with mucosal enhancement and perigastric/peri duodenal inflammation. Small foci of extraluminal air in the porta hepatis consistent perforation. Regional stranding and free fluid without organized abscess. Duodenal diverticulum about the fourth portion again seen without inflammation. Remaining small  bowel is  unremarkable. No obstruction. Mild distal descending and sigmoid diverticulosis without diverticulitis. Appendix is not confidently visualized. Vascular/Lymphatic: Aorta bi-iliac atherosclerosis. No aneurysm. Circumaortic left renal vein. No adenopathy. Reproductive: Prostatic calcifications. Other: Foci of air in the right upper quadrant of the abdomen in the porta hepatis consistent with bowel perforation. Right upper quadrant inflammatory change and free fluid without organized abscess. Small amount of free fluid tracks into the pelvis. Left inguinal canal contains fat and small amount of free fluid. Prior right inguinal hernia repair. Right scrotal hydroceles partially included. Musculoskeletal: Multilevel degenerative change throughout spine. Unchanged grade 1 retrolisthesis of L3 on L4, degenerative. There are no acute or suspicious osseous abnormalities. IMPRESSION: 1. Findings highly suspicious for perforated peptic ulcer with small foci of extraluminal air in the porta hepatis and right upper quadrant, wall thickening of the peri pyloric stomach and proximal duodenum. No organized abscess. 2. Mild gallbladder wall thickening and adjacent stranding is likely reactive secondary to gastric/duodenal inflammation, similar to slightly improved from prior exam. 3. Colonic diverticulosis without diverticulitis. 4. Absent renal excretion on delayed phase imaging consistent with underlying renal dysfunction. 5. Small right pleural effusion. 6. Emphysema at the lung bases with round atelectasis in the left lower lobe. Emphysema (ICD10-J43.9). 7.  Aortic Atherosclerosis (ICD10-I70.0). Critical Value/emergent results were called by telephone at the time of interpretation on 01/31/2018 at 9:22 pm to Dr. Pricilla LovelessSCOTT GOLDSTON , who verbally acknowledged these results. Electronically Signed   By: Narda RutherfordMelanie  Sanford M.D.   On: 01/31/2018 21:24   Dg Abd 2 Views  Result Date: 02/01/2018 CLINICAL DATA:  Perforated ulcer on recent  CT EXAM: ABDOMEN - 2 VIEW COMPARISON:  01/31/2018 FINDINGS: Scattered large and small bowel gas is noted. Some early small-bowel dilatation is noted. This may represent an overall ileus as opposed to true small-bowel obstruction. Correlation with the physical exam is recommended. Contrast material is noted within the bladder from the recent CT examination. No definitive free air is seen. IMPRESSION: No free air identified. Generalized small bowel dilatation likely representing an ileus. Correlation with the physical exam is recommended. Continued follow-up is recommended. Electronically Signed   By: Alcide CleverMark  Lukens M.D.   On: 02/01/2018 07:49   Dg Vangie BickerNaso G Tube Plc W/fl W/rad  Result Date: 02/01/2018 CLINICAL DATA:  84 year old with a perforated peptic ulcer. Patient needs a nasogastric tube and unable to place without imaging. EXAM: NASO G TUBE PLACEMENT WITH FL AND WITH RAD CONTRAST:  None FLUOROSCOPY TIME:  Fluoroscopy Time:  1 minutes and 16 seconds Radiation Exposure Index (if provided by the fluoroscopic device): 11.5 mGy COMPARISON:  None. FINDINGS: Lidocaine gel was placed in both nostrils using cotton swabs. Nasogastric tube was advanced down the left nostril with the patient was sitting upright. Once the tube was thought to be within the esophagus, the patient was moved on to the fluoroscopic table. The tube was advanced down to the GE junction. It was difficult to advance the catheter beyond the GE junction and fluoroscopy demonstrate that the tube was coiling in the patient's mouth. Stiff Amplatz wire was advanced through the NG tube and this helped advance the catheter into the stomach. Catheter was secured to the skin with tape. Nasogastric tube tip confirmed in the gastric fundus region with fluoroscopy. Fluoroscopic images were taken and saved for this procedure. IMPRESSION: Successful placement of a nasogastric tube with fluoroscopic guidance. Electronically Signed   By: Richarda OverlieAdam  Henn M.D.   On:  02/01/2018 13:48    Anti-infectives:  Anti-infectives (From admission, onward)   Start     Dose/Rate Route Frequency Ordered Stop   02/01/18 1400  ciprofloxacin (CIPRO) IVPB 400 mg     400 mg 200 mL/hr over 60 Minutes Intravenous Every 12 hours 02/01/18 0210     02/01/18 0600  metroNIDAZOLE (FLAGYL) IVPB 500 mg     500 mg 100 mL/hr over 60 Minutes Intravenous Every 8 hours 01/31/18 2318     01/31/18 2130  ciprofloxacin (CIPRO) IVPB 400 mg     400 mg 200 mL/hr over 60 Minutes Intravenous  Once 01/31/18 2123 02/01/18 0256   01/31/18 2130  metroNIDAZOLE (FLAGYL) IVPB 500 mg     500 mg 100 mL/hr over 60 Minutes Intravenous  Once 01/31/18 2123 02/01/18 0138      Assessment/Plan: 1.  Perforated gastric/duodenal ulcer:  Keep NGT out for now & abdominal pain better; con't abx; strict NPO.  Will need upper GI Sunday to assess integrity of self patch 2.  HTN: stable 3.  Diverticulosis 4.  Emphysema             On an inhaler 5.  Hard of hearing 6.  Right scrotal hydrocele   LOS: 2 days    Axel Filler 02/02/2018

## 2018-02-02 NOTE — Progress Notes (Signed)
Echocardiogram 2D Echocardiogram has been performed.  Pieter PartridgeBrooke S Waylin Dorko 02/02/2018, 9:35 AM

## 2018-02-03 DIAGNOSIS — I480 Paroxysmal atrial fibrillation: Secondary | ICD-10-CM

## 2018-02-03 DIAGNOSIS — I248 Other forms of acute ischemic heart disease: Secondary | ICD-10-CM

## 2018-02-03 LAB — CBC
HCT: 40 % (ref 39.0–52.0)
Hemoglobin: 13 g/dL (ref 13.0–17.0)
MCH: 30.7 pg (ref 26.0–34.0)
MCHC: 32.5 g/dL (ref 30.0–36.0)
MCV: 94.3 fL (ref 80.0–100.0)
Platelets: 106 10*3/uL — ABNORMAL LOW (ref 150–400)
RBC: 4.24 MIL/uL (ref 4.22–5.81)
RDW: 14.9 % (ref 11.5–15.5)
WBC: 10.8 10*3/uL — ABNORMAL HIGH (ref 4.0–10.5)
nRBC: 0 % (ref 0.0–0.2)

## 2018-02-03 LAB — BASIC METABOLIC PANEL
Anion gap: 4 — ABNORMAL LOW (ref 5–15)
BUN: 28 mg/dL — AB (ref 8–23)
CO2: 21 mmol/L — ABNORMAL LOW (ref 22–32)
CREATININE: 0.75 mg/dL (ref 0.61–1.24)
Calcium: 7.8 mg/dL — ABNORMAL LOW (ref 8.9–10.3)
Chloride: 109 mmol/L (ref 98–111)
GFR calc Af Amer: >60 mL/min (ref >60)
GFR calc non Af Amer: >60 mL/min (ref >60)
Glucose, Bld: 107 mg/dL — ABNORMAL HIGH (ref 70–99)
Potassium: 4 mmol/L (ref 3.5–5.1)
Sodium: 134 mmol/L — ABNORMAL LOW (ref 135–145)

## 2018-02-03 LAB — MAGNESIUM: Magnesium: 2.1 mg/dL (ref 1.7–2.4)

## 2018-02-03 MED ORDER — KCL IN DEXTROSE-NACL 10-5-0.45 MEQ/L-%-% IV SOLN
INTRAVENOUS | Status: AC
  Administered 2018-02-03: 12:00:00 via INTRAVENOUS
  Filled 2018-02-03 (×2): qty 1000

## 2018-02-03 MED ORDER — SODIUM CHLORIDE 0.9 % IV BOLUS
1000.0000 mL | Freq: Once | INTRAVENOUS | Status: AC
  Administered 2018-02-03: 1000 mL via INTRAVENOUS

## 2018-02-03 MED ORDER — ORAL CARE MOUTH RINSE
15.0000 mL | Freq: Two times a day (BID) | OROMUCOSAL | Status: DC
  Administered 2018-02-03 – 2018-02-04 (×3): 15 mL via OROMUCOSAL

## 2018-02-03 MED ORDER — LORAZEPAM 2 MG/ML IJ SOLN
1.0000 mg | Freq: Once | INTRAMUSCULAR | Status: AC
  Administered 2018-02-03: 1 mg via INTRAVENOUS
  Filled 2018-02-03: qty 1

## 2018-02-03 MED ORDER — DILTIAZEM HCL 100 MG IV SOLR
5.0000 mg/h | INTRAVENOUS | Status: DC
  Administered 2018-02-04: 5 mg/h via INTRAVENOUS
  Administered 2018-02-05: 7.5 mg/h via INTRAVENOUS
  Filled 2018-02-03 (×5): qty 100

## 2018-02-03 MED ORDER — DILTIAZEM HCL 100 MG IV SOLR
5.0000 mg/h | INTRAVENOUS | Status: DC
  Filled 2018-02-03: qty 100

## 2018-02-03 MED ORDER — LIP MEDEX EX OINT
TOPICAL_OINTMENT | CUTANEOUS | Status: AC
  Administered 2018-02-03: 13:00:00
  Filled 2018-02-03: qty 7

## 2018-02-03 NOTE — Progress Notes (Addendum)
PROGRESS NOTE  Manuel Reilly ZOX:[id number] DOB: March 31, 1923 DOA: 01/31/2018 PCP: [name], MD  HPI/Recap of past 24 hours:   Pulled out ng the night on 11/28-1/29 +bm No vomiting, he denies pain currently, no fever  Remain in afib, denies chest pain, he is on 2liter o2  Assessment/Plan: Principal Problem:   Perforated peptic ulcer (HCC) Active Problems:   COPD (chronic obstructive pulmonary disease) (HCC)   Elevated troponin   Pleural effusion   HTN (hypertension)  Perforated peptic ulcer/ileus  -General surgery consulted, conservative management with NG suction/ PPIdrip/cipro/flagyl/ivf (he pulled out ng last night) -Keep k >4, keep mag>2 -will follow general surgery recommendation  New onset of A. Fib -Not a candidate for anticoagulation due to perforated peptic ulcer -Rate control with cardizem drip -Echocardiogram ordered -cardiology consulted  Thrombocytopenia: From acute illness? /consumption?/abx? monitor   Daughter states patient lives by himself, still drives and go shopping himself prior to this  Code Status: DNR  Family Communication: patient in the room, daughter vicki over the phone  Disposition Plan: remain in stepdown   Consultants:  General surgery  Critical care  cardiology  Procedures:  none  Antibiotics:  As above   Objective: BP (!) 120/48   Pulse (!) 118   Temp 98.6 F (37 C) (Oral)   Resp (!) 26   Wt 75.8 kg   SpO2 (!) 89%   BMI 23.96 kg/m   Intake/Output Summary (Last 24 hours) at 02/03/2018 0736 Last data filed at 02/03/2018 0600 Gross per 24 hour  Intake 800 ml  Output 800 ml  Net 0 ml   Filed Weights   02/01/18 0201  Weight: 75.8 kg    Exam: Patient is examined daily including today on 02/03/2018, exams remain the same as of yesterday except that has changed    General:  NAD, appear younger than staged age  Cardiovascular: IRRR  Respiratory: CTABL  Abdomen: less distended,  nontender, dose has bowel sounds, but hypoactive,    Musculoskeletal: No Edema  Neuro: alert, oriented to the year, place and person, not to the month  Data Reviewed: Basic Metabolic Panel: Recent Labs  Lab 01/31/18 1708 02/01/18 0754 02/02/18 0324 02/03/18 0320  NA 133* 135 134* 134*  K 3.6 3.6 3.6 4.0  CL 98 104 105 109  CO2 21* 23 20* 21*  GLUCOSE 73 66* 116* 107*  BUN 38* 42* 32* 28*  CREATININE 1.20 1.21 0.89 0.75  CALCIUM 8.3* 7.8* 7.7* 7.8*  MG  --  2.0  --  2.1   Liver Function Tests: Recent Labs  Lab 01/31/18 1708 02/01/18 0754 02/02/18 0324  AST 70* 46* 27  ALT 90* 64* 44  ALKPHOS 102 84 83  BILITOT 1.1 0.9 1.1  PROT 6.2* 5.6* 5.0*  ALBUMIN 3.4* 3.2* 2.6*   Recent Labs  Lab 01/31/18 1708  LIPASE 59*   No results for input(s): AMMONIA in the last 168 hours. CBC: Recent Labs  Lab 01/31/18 1708 02/01/18 0132 02/01/18 0511 02/01/18 0753 02/02/18 0324  WBC 9.9 12.5* 11.8* 11.4* 9.0  NEUTROABS 9.2*  --   --   --  8.0*  HGB 16.7 15.4 13.9 14.3 13.6  HCT 50.7 46.0 42.2 43.8 41.8  MCV 90.7 92.4 93.2 94.8 93.9  PLT 180 164 143* 143* 127*   Cardiac Enzymes:   Recent Labs  Lab 01/31/18 1708 02/01/18 0132 02/01/18 0511 02/01/18 1508  TROPONINI 0.09* 0.09* 0.09* 0.14*   BNP (last 3 results) No results  for input(s): BNP in the last 8760 hours.  ProBNP (last 3 results) No results for input(s): PROBNP in the last 8760 hours.  CBG: No results for input(s): GLUCAP in the last 168 hours.  Recent Results (from the past 240 hour(s))  MRSA PCR Screening     Status: None   Collection Time: 02/01/18  3:00 PM  Result Value Ref Range Status   MRSA by PCR NEGATIVE NEGATIVE Final    Comment:        The GeneXpert MRSA Assay (FDA approved for NASAL specimens only), is one component of a comprehensive MRSA colonization surveillance program. It is not intended to diagnose MRSA infection nor to guide or monitor treatment for MRSA  infections. Performed at [facility], [address] [place], [place] [id number]      Studies: No results found.  Scheduled Meds: . mouth rinse  15 mL Mouth Rinse BID  . [START ON 02/04/2018] pantoprazole  40 mg Intravenous Q12H  . sucralfate  1 g Oral Q6H    Continuous Infusions: . ciprofloxacin Stopped (02/03/18 0244)  . dextrose 5 % and 0.45 % NaCl with KCl 10 mEq/L 50 mL/hr at 02/02/18 1219  . diltiazem (CARDIZEM) infusion 5 mg/hr (02/03/18 0400)  . metronidazole 500 mg (02/03/18 0646)  . pantoprozole (PROTONIX) infusion 8 mg/hr (02/02/18 1100)  . sodium chloride Stopped (02/03/18 [id number])     Time spent: 35mins I have personally reviewed and interpreted on  02/03/2018 daily labs, tele strips, imagings as discussed above under date review session and assessment and plans.  I reviewed all nursing notes, pharmacy notes, consultant notes,  vitals, pertinent old records  I have discussed plan of care as described above with RN , patient on 02/03/2018   Albertine GratesFang Tiawana Forgy MD, PhD  [facility] Pager [phone]. If 7PM-7AM, please contact night-coverage at [email or web address], password [email or web address] 02/03/2018, 7:36 AM  LOS: 3 days

## 2018-02-03 NOTE — Consult Note (Signed)
Cardiology Consultation:   Patient ID: Manuel Reilly MRN: [id number]; DOB: Dec 08, 1923  Admit date: 01/31/2018 Date of Consult: 02/03/2018  Primary Care Provider: [name], MD Primary Cardiologist: No primary care provider on file. NEW Primary Electrophysiologist:  None    Patient Profile:   Manuel Reilly is a 84 y.o. male  who is being seen today for the evaluation of atrial fibrillation in the setting of perforated ulcer at the request of Dr Roda Shutters.  History of Present Illness:   Manuel Reilly is a 84 year old male with perforated peptic ulcer/ileus, seen originally on 01/31/2018 by Dr. Ezzard Standing of [facility] surgery-medical management, who developed atrial fibrillation.  He has not seen a cardiologist previously.  CT scan showed minimal air minimal fluid and no evidence of contrast so the perforation was fairly well contained.  NG tube was placed antibiotics IV fluids.  His abdominal pain is improving.  Yesterday evening went into atrial fibrillation. IV diltiazem started, currently at 5. HR about 100. Unfortunately echo images were on suitable for interpretation.  No CP, no SOB, no syncope  Past Medical History:  Diagnosis Date  . Allergy   . Anxiety   . Diverticulosis   . History of colon polyps   . History of hemorrhoids   . History of inguinal herniorrhaphy    right  . Hyperlipidemia   . Hypogonadism, male   . Sciatica    right    Past Surgical History:  Procedure Laterality Date  . CARPAL TUNNEL RELEASE    . HERNIA REPAIR       Home Medications:  Prior to Admission medications   Medication Sig Start Date End Date Taking? Authorizing Provider  acetaminophen (TYLENOL) 500 MG tablet Take 500 mg by mouth every 6 (six) hours as needed for mild pain, moderate pain, fever or headache.   Yes [provider]  albuterol (PROVENTIL HFA;VENTOLIN HFA) 108 (90 Base) MCG/ACT inhaler Inhale 2 puffs into the lungs every 6 (six) hours as needed for  wheezing or shortness of breath.   Yes [provider]  ALPRAZolam (XANAX) 0.25 MG tablet Take 1 tablet (0.25 mg total) by mouth at bedtime as needed. Needs visit for refill Patient taking differently: Take 0.25 mg by mouth at bedtime as needed for anxiety. Needs visit for refill 09/19/17   [name], MD  pantoprazole (PROTONIX) 20 MG tablet Take 1 tablet (20 mg total) by mouth daily. Patient not taking: Reported on 01/31/2018 06/05/17   [name], MD    Inpatient Medications: Scheduled Meds: . mouth rinse  15 mL Mouth Rinse BID  . [START ON 02/04/2018] pantoprazole  40 mg Intravenous Q12H  . sucralfate  1 g Oral Q6H   Continuous Infusions: . ciprofloxacin Stopped (02/03/18 0244)  . dextrose 5 % and 0.45 % NaCl with KCl 10 mEq/L 50 mL/hr at 02/02/18 1219  . diltiazem (CARDIZEM) infusion 5 mg/hr (02/03/18 0400)  . metronidazole Stopped (02/03/18 0748)  . pantoprozole (PROTONIX) infusion 8 mg/hr (02/02/18 1100)  . sodium chloride 1,000 mL (02/03/18 0826)   PRN Meds: acetaminophen **OR** acetaminophen, albuterol, ipratropium-albuterol, morphine injection, ondansetron (ZOFRAN) IV  Allergies:    Allergies  Allergen Reactions  . Penicillins Hives, Shortness Of Breath and Rash    Has patient had a PCN reaction causing immediate rash, facial/tongue/throat swelling, SOB or lightheadedness with hypotension: Yes Has patient had a PCN reaction causing severe rash involving mucus membranes or skin necrosis: Yes Has patient had a PCN reaction that required  hospitalization No Has patient had a PCN reaction occurring within the last 10 years: No If all of the above answers are "NO", then may proceed with Cephalosporin use.   . Procaine Hcl Anaphylaxis and Swelling  . Wasp Venom Swelling    Swelling of throat and swelling at the site     Social History:   Social History   Socioeconomic History  . Marital status: Widowed    Spouse name: Not on file  . Number of  children: Not on file  . Years of education: Not on file  . Highest education level: Not on file  Occupational History  . Not on file  Social Needs  . Financial resource strain: Not on file  . Food insecurity:    Worry: Not on file    Inability: Not on file  . Transportation needs:    Medical: Not on file    Non-medical: Not on file  Tobacco Use  . Smoking status: Never Smoker  . Smokeless tobacco: Never Used  Substance and Sexual Activity  . Alcohol use: Yes    Comment: occasionally  . Drug use: No  . Sexual activity: Not on file  Lifestyle  . Physical activity:    Days per week: Not on file    Minutes per session: Not on file  . Stress: Not on file  Relationships  . Social connections:    Talks on phone: Not on file    Gets together: Not on file    Attends religious service: Not on file    Active member of club or organization: Not on file    Attends meetings of clubs or organizations: Not on file    Relationship status: Not on file  . Intimate partner violence:    Fear of current or ex partner: Not on file    Emotionally abused: Not on file    Physically abused: Not on file    Forced sexual activity: Not on file  Other Topics Concern  . 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 Cancer Mother        colon  . Cancer Father        prostate  . Colon polyps Brother   . Diabetes Brother   . Heart disease Brother      ROS:  Please see the history of present illness.  Denies any chest pain syncope All other ROS reviewed and negative.     Physical Exam/Data:   Vitals:   02/03/18 0400 02/03/18 0408 02/03/18 0500 02/03/18 0600  BP: (!) 99/39  (!) 96/29 (!) 120/48  Pulse: 76   75 (!) 118  Resp: 18  (!) 21 (!) 26  Temp:  98.6 F (37 C)    TempSrc:  Oral    SpO2: 96%  97% (!) 89%  Weight:        Intake/Output Summary (Last 24 hours) at 02/03/2018 0848 Last data filed at 02/03/2018 0600 Gross per 24 hour  Intake 800 ml  Output 800 ml  Net 0 ml   Filed Weights   02/01/18 0201  Weight: 75.8 kg  Body mass index is 23.96 kg/m.  General: Elderly  HEENT: normal Lymph: no adenopathy Neck: no JVD Endocrine:  No thryomegaly Vascular: No carotid bruits; FA pulses 2+ bilaterally without bruits  Cardiac:  normal S1, S2; irregularly irregular; no murmur Lungs:  clear to auscultation bilaterally, no wheezing, rhonchi or rales  Abd: soft, nontender, no hepatomegaly, no significant bowel sounds Ext: no edema Musculoskeletal:  No deformities, BUE and BLE strength normal and equal Skin: warm and dry  Neuro:  CNs 2-12 intact, no focal abnormalities noted Psych:  Normal affect   EKG:  The EKG was personally reviewed and demonstrates: RBBB, left anterior fascicular block, atrial fibrillation 110  Telemetry:  Telemetry was personally reviewed and demonstrates: Atrial fibrillation heart rates on average less than 110  Relevant CV Studies: Echo uninterpretable secondary to poor image quality  Laboratory Data:  Chemistry Recent Labs  Lab 02/01/18 0754 02/02/18 0324 02/03/18 0320  NA 135 134* 134*  K 3.6 3.6 4.0  CL 104 105 109  CO2 23 20* 21*  GLUCOSE 66* 116* 107*  BUN 42* 32* 28*  CREATININE 1.21 0.89 0.75  CALCIUM 7.8* 7.7* 7.8*  GFRNONAA 52* >60 >60  GFRAA 60* >60 >60  ANIONGAP 8 9 4*    Recent Labs  Lab 01/31/18 1708 02/01/18 0754 02/02/18 0324  PROT 6.2* 5.6* 5.0*  ALBUMIN 3.4* 3.2* 2.6*  AST 70* 46* 27  ALT 90* 64* 44  ALKPHOS 102 84 83  BILITOT 1.1 0.9 1.1   Hematology Recent Labs  Lab 02/01/18 0753 02/02/18 0324 02/03/18 0320  WBC 11.4* 9.0 10.8*  RBC 4.62 4.45 4.24  HGB 14.3 13.6 13.0  HCT 43.8 41.8 40.0  MCV 94.8 93.9  94.3  MCH 31.0 30.6 30.7  MCHC 32.6 32.5 32.5  RDW 14.3 14.6 14.9  PLT 143* 127* 106*   Cardiac Enzymes Recent Labs  Lab 01/31/18 1708 02/01/18 0132 02/01/18 0511 02/01/18 1508  TROPONINI 0.09* 0.09* 0.09* 0.14*   No results for input(s): TROPIPOC in the last 168 hours.  BNPNo results for input(s): BNP, PROBNP in the last 168 hours.  DDimer No results for input(s): DDIMER in the last 168 hours.  Radiology/Studies:  Dg Chest 2 View  Result Date: 01/31/2018 CLINICAL DATA:  Upper abdominal pain EXAM: CHEST - 2 VIEW COMPARISON:  09/09/2017 FINDINGS: Small pleural effusions. No focal consolidation. Scarring in the right lower lobe. Stable cardiomediastinal silhouette. No pneumothorax. Lucency in the left upper quadrant thought to reflect gas within the gastric fundus. IMPRESSION: Small bilateral pleural effusions Electronically Signed   By: Jasmine Pang M.D.   On: 01/31/2018 18:03   Ct Abdomen Pelvis W Contrast  Result Date: 01/31/2018 CLINICAL DATA:  84 year old with acute abdominal pain. EXAM: CT ABDOMEN AND PELVIS WITH CONTRAST TECHNIQUE: Multidetector CT imaging of the abdomen and pelvis was performed using the standard protocol following bolus administration of intravenous contrast. CONTRAST:  ISOVUE-300 IOPAMIDOL (ISOVUE-300) INJECTION 61% COMPARISON:  CT 09/09/2017 FINDINGS: Lower chest: Unchanged ovoid density in the left lower lobe consistent with round atelectasis. Bibasilar scarring. Subpleural bleb in the right lower lobe small right pleural effusion is new. There are coronary artery calcifications. Hepatobiliary: 15 mm cyst in the left lobe of the liver. Minimal gallbladder wall thickening and adjacent stranding is likely reactive secondary to gastric/duodenal inflammation, similar to slightly improved from prior exam. No calcified gallstone. No biliary dilatation. Pancreas: Fatty atrophy.  No ductal dilatation or inflammation. Spleen: Normal in size without focal  abnormality. Adrenals/Urinary Tract:  Normal adrenal glands. No hydronephrosis or perinephric edema. Homogeneous renal enhancement. Absent renal excretion on delayed phase imaging suggesting underlying renal dysfunction. Symmetric age-related renal atrophy. 12 mm cyst from the lower right kidney. Urinary bladder is physiologically distended without wall thickening. Stomach/Bowel: Stomach distended with air-fluid level. Peri pyloric wall thickening of the distal stomach and duodenum with mucosal enhancement and perigastric/peri duodenal inflammation. Small foci of extraluminal air in the porta hepatis consistent perforation. Regional stranding and free fluid without organized abscess. Duodenal diverticulum about the fourth portion again seen without inflammation. Remaining small bowel is unremarkable. No obstruction. Mild distal descending and sigmoid diverticulosis without diverticulitis. Appendix is not confidently visualized. Vascular/Lymphatic: Aorta bi-iliac atherosclerosis. No aneurysm. Circumaortic left renal vein. No adenopathy. Reproductive: Prostatic calcifications. Other: Foci of air in the right upper quadrant of the abdomen in the porta hepatis consistent with bowel perforation. Right upper quadrant inflammatory change and free fluid without organized abscess. Small amount of free fluid tracks into the pelvis. Left inguinal canal contains fat and small amount of free fluid. Prior right inguinal hernia repair. Right scrotal hydroceles partially included. Musculoskeletal: Multilevel degenerative change throughout spine. Unchanged grade 1 retrolisthesis of L3 on L4, degenerative. There are no acute or suspicious osseous abnormalities. IMPRESSION: 1. Findings highly suspicious for perforated peptic ulcer with small foci of extraluminal air in the porta hepatis and right upper quadrant, wall thickening of the peri pyloric stomach and proximal duodenum. No organized abscess. 2. Mild gallbladder wall thickening  and adjacent stranding is likely reactive secondary to gastric/duodenal inflammation, similar to slightly improved from prior exam. 3. Colonic diverticulosis without diverticulitis. 4. Absent renal excretion on delayed phase imaging consistent with underlying renal dysfunction. 5. Small right pleural effusion. 6. Emphysema at the lung bases with round atelectasis in the left lower lobe. Emphysema (ICD10-J43.9). 7.  Aortic Atherosclerosis (ICD10-I70.0). Critical Value/emergent results were called by telephone at the time of interpretation on 01/31/2018 at 9:22 pm to Dr. Pricilla LovelessSCOTT GOLDSTON , who verbally acknowledged these results. Electronically Signed   By: Narda RutherfordMelanie  Sanford M.D.   On: 01/31/2018 21:24   Dg Abd 2 Views  Result Date: 02/01/2018 CLINICAL DATA:  Perforated ulcer on recent CT EXAM: ABDOMEN - 2 VIEW COMPARISON:  01/31/2018 FINDINGS: Scattered large and small bowel gas is noted. Some early small-bowel dilatation is noted. This may represent an overall ileus as opposed to true small-bowel obstruction. Correlation with the physical exam is recommended. Contrast material is noted within the bladder from the recent CT examination. No definitive free air is seen. IMPRESSION: No free air identified. Generalized small bowel dilatation likely representing an ileus. Correlation with the physical exam is recommended. Continued follow-up is recommended. Electronically Signed   By: Alcide CleverMark  Lukens M.D.   On: 02/01/2018 07:49   Dg Vangie BickerNaso G Tube Plc W/fl W/rad  Result Date: 02/01/2018 CLINICAL DATA:  84 year old with a perforated peptic ulcer. Patient needs a nasogastric tube and unable to place without imaging. EXAM: NASO G TUBE PLACEMENT WITH FL AND WITH RAD CONTRAST:  None FLUOROSCOPY TIME:  Fluoroscopy Time:  1 minutes and 16 seconds Radiation Exposure Index (if provided by the fluoroscopic device): 11.5 mGy COMPARISON:  None. FINDINGS: Lidocaine gel was placed in both nostrils using cotton swabs. Nasogastric tube  was advanced down the left nostril with the patient was sitting upright. Once the tube was thought to be within the esophagus, the patient was moved on to the fluoroscopic table. The tube was advanced down to the GE junction. It was  difficult to advance the catheter beyond the GE junction and fluoroscopy demonstrate that the tube was coiling in the patient's mouth. Stiff Amplatz wire was advanced through the NG tube and this helped advance the catheter into the stomach. Catheter was secured to the skin with tape. Nasogastric tube tip confirmed in the gastric fundus region with fluoroscopy. Fluoroscopic images were taken and saved for this procedure. IMPRESSION: Successful placement of a nasogastric tube with fluoroscopic guidance. Electronically Signed   By: Richarda Overlie M.D.   On: 02/01/2018 13:48    Assessment and Plan:   New onset atrial fibrillation, paroxysmal -In the setting of current peptic ulcer perforation/ileus/metabolic derangement -Not a candidate for anticoagulation at this time due to increased risks of bleeding. -Atrial fibrillation is likely exacerbated from his underlying illness.  May have a reversible cause.  Of course he does have advanced age which is a risk factor for atrial fibrillation. - Echo was uninterpretable due to poor image quality. - Agree with trying to keep potassium greater than 4, magnesium greater than 2.  Continue with optimizing rate control with Cardizem drip as blood pressure allows. Currently at 5. Reasonable control. Hopefully as he improves clinically, he may autoconvert.  - If blood pressure were to become an issue, i.e. hypotension, one could utilize IV amiodarone as a temporizing agent for both rate control and possible coaxing to rhythm control.  Perforated peptic ulcer - Per surgical team.  Medical management.  Elevated troponin -Low level 0.09 flat, demand ischemia in the current setting of his illness. As of note, patient lives by himself, drives,  goes shopping prior to this illness.  No acute coronary syndrome.  Right bundle branch block/left anterior fascicular block -Seen with his ECG.  Watch for any signs of worsening conduction disease.  DNR currently.      For questions or updates, please contact [facility] Please consult [email or web address] for contact info under     Signed, Donato Schultz, MD  02/03/2018 8:48 AM

## 2018-02-03 NOTE — Progress Notes (Signed)
Pharmacy Antibiotic Note  Manuel Reilly is a 84 y.o. male admitted on 01/31/2018 with perforated peptic ulcer.  Pharmacy has been consulted for cipro dosing. Pt also on Flagyl. Today is Day #4 of abx.  Renal function stable.  Plan: Continue Cipro 400 mg IV q12h Flagyl 500 mg IV q8h (MD)  Weight: 167 lb (75.8 kg)  Temp (24hrs), Avg:98.6 F (37 C), Min:98.3 F (36.8 C), Max:98.9 F (37.2 C)  Recent Labs  Lab 01/31/18 1708 02/01/18 0132 02/01/18 0511 02/01/18 0753 02/01/18 0754 02/02/18 0324 02/03/18 0320  WBC 9.9 12.5* 11.8* 11.4*  --  9.0 10.8*  CREATININE 1.20  --   --   --  1.21 0.89 0.75  LATICACIDVEN  --   --   --   --   --  1.7  --     Estimated Creatinine Clearance: 60.8 mL/min (by C-G formula based on SCr of 0.75 mg/dL).    Allergies  Allergen Reactions  . Penicillins Hives, Shortness Of Breath and Rash    Has patient had a PCN reaction causing immediate rash, facial/tongue/throat swelling, SOB or lightheadedness with hypotension: Yes Has patient had a PCN reaction causing severe rash involving mucus membranes or skin necrosis: Yes Has patient had a PCN reaction that required hospitalization No Has patient had a PCN reaction occurring within the last 10 years: No If all of the above answers are "NO", then may proceed with Cephalosporin use.   . Procaine Hcl Anaphylaxis and Swelling  . Wasp Venom Swelling    Swelling of throat and swelling at the site     Antimicrobials this admission: 11/27 cipro >>  11/27 flagyl >>    Microbiology results:  MRSA PCR: negative  Thank you for allowing pharmacy to be a part of this patient's care.  Christoper Fabianaron Kharma Sampsel, PharmD, BCPS Clinical pharmacist 02/03/2018 10:39 AM

## 2018-02-03 NOTE — Progress Notes (Signed)
Dr. Roda ShuttersXu made aware of pt's urine output and nature of urine. MD said she will place new orders in for pt.  VWilliams,RN.

## 2018-02-03 NOTE — Progress Notes (Signed)
3 Days Post-Op   Subjective/Chief Complaint: Pt went into Afib overnight on Dilt gtt abd pain better   Objective: Vital signs in last 24 hours: Temp:  [98.2 F (36.8 C)-98.9 F (37.2 C)] 98.6 F (37 C) (11/30 0408) Pulse Rate:  [76-149] 76 (11/30 0400) Resp:  [18-27] 18 (11/30 0400) BP: (73-128)/(23-56) 99/39 (11/30 0400) SpO2:  [93 %-98 %] 96 % (11/30 0400) Last BM Date: 02/01/18  Intake/Output from previous day: 11/29 0701 - 11/30 0700 In: 800 [I.V.:300; IV Piggyback:500] Out: 400 [Urine:400] Intake/Output this shift: Total I/O In: 300 [IV Piggyback:300] Out: -   Constitutional: No acute distress, conversant, appears states age. Eyes: Anicteric sclerae, moist conjunctiva, no lid lag Lungs: Clear to auscultation bilaterally, normal respiratory effort CV: regular rate and rhythm, no murmurs, no peripheral edema, pedal pulses 2+ GI: Soft, no masses or hepatosplenomegaly,  Non tender to palpation Skin: No rashes, palpation reveals normal turgor Psychiatric: appropriate judgment and insight, oriented to person, place, and time    Lab Results:  Recent Labs    02/01/18 0753 02/02/18 0324  WBC 11.4* 9.0  HGB 14.3 13.6  HCT 43.8 41.8  PLT 143* 127*   BMET Recent Labs    02/02/18 0324 02/03/18 0320  NA 134* 134*  K 3.6 4.0  CL 105 109  CO2 20* 21*  GLUCOSE 116* 107*  BUN 32* 28*  CREATININE 0.89 0.75  CALCIUM 7.7* 7.8*   PT/INR Recent Labs    02/01/18 0754  LABPROT 17.1*  INR 1.41   ABG No results for input(s): PHART, HCO3 in the last 72 hours.  Invalid input(s): PCO2, PO2  Studies/Results: Dg Abd 2 Views  Result Date: 02/01/2018 CLINICAL DATA:  Perforated ulcer on recent CT EXAM: ABDOMEN - 2 VIEW COMPARISON:  01/31/2018 FINDINGS: Scattered large and small bowel gas is noted. Some early small-bowel dilatation is noted. This may represent an overall ileus as opposed to true small-bowel obstruction. Correlation with the physical exam is recommended.  Contrast material is noted within the bladder from the recent CT examination. No definitive free air is seen. IMPRESSION: No free air identified. Generalized small bowel dilatation likely representing an ileus. Correlation with the physical exam is recommended. Continued follow-up is recommended. Electronically Signed   By: Alcide CleverMark  Lukens M.D.   On: 02/01/2018 07:49   Dg Vangie BickerNaso G Tube Plc W/fl W/rad  Result Date: 02/01/2018 CLINICAL DATA:  84 year old with a perforated peptic ulcer. Patient needs a nasogastric tube and unable to place without imaging. EXAM: NASO G TUBE PLACEMENT WITH FL AND WITH RAD CONTRAST:  None FLUOROSCOPY TIME:  Fluoroscopy Time:  1 minutes and 16 seconds Radiation Exposure Index (if provided by the fluoroscopic device): 11.5 mGy COMPARISON:  None. FINDINGS: Lidocaine gel was placed in both nostrils using cotton swabs. Nasogastric tube was advanced down the left nostril with the patient was sitting upright. Once the tube was thought to be within the esophagus, the patient was moved on to the fluoroscopic table. The tube was advanced down to the GE junction. It was difficult to advance the catheter beyond the GE junction and fluoroscopy demonstrate that the tube was coiling in the patient's mouth. Stiff Amplatz wire was advanced through the NG tube and this helped advance the catheter into the stomach. Catheter was secured to the skin with tape. Nasogastric tube tip confirmed in the gastric fundus region with fluoroscopy. Fluoroscopic images were taken and saved for this procedure. IMPRESSION: Successful placement of a nasogastric tube with fluoroscopic guidance.  Electronically Signed   By: Richarda Overlie M.D.   On: 02/01/2018 13:48    Anti-infectives: Anti-infectives (From admission, onward)   Start     Dose/Rate Route Frequency Ordered Stop   02/01/18 1400  ciprofloxacin (CIPRO) IVPB 400 mg     400 mg 200 mL/hr over 60 Minutes Intravenous Every 12 hours 02/01/18 0210     02/01/18 0600   metroNIDAZOLE (FLAGYL) IVPB 500 mg     500 mg 100 mL/hr over 60 Minutes Intravenous Every 8 hours 01/31/18 2318     01/31/18 2130  ciprofloxacin (CIPRO) IVPB 400 mg     400 mg 200 mL/hr over 60 Minutes Intravenous  Once 01/31/18 2123 02/01/18 0256   01/31/18 2130  metroNIDAZOLE (FLAGYL) IVPB 500 mg     500 mg 100 mL/hr over 60 Minutes Intravenous  Once 01/31/18 2123 02/01/18 0138      Assessment/Plan: 1.  Perforated gastric/duodenal ulcer:  Keep NGT out for now & abdominal pain better; con't abx; strict NPO.  Upper GI Sunday to assess integrity of self patch 2.  HTN: stable 3.  Diverticulosis 4.  Emphysema             On an inhaler 5.  Hard of hearing 6.  Right scrotal hydrocele   LOS: 3 days    Axel Filler 02/03/2018

## 2018-02-04 ENCOUNTER — Encounter (HOSPITAL_COMMUNITY): Payer: Self-pay

## 2018-02-04 ENCOUNTER — Inpatient Hospital Stay (HOSPITAL_COMMUNITY): Payer: Medicare Other

## 2018-02-04 DIAGNOSIS — R7989 Other specified abnormal findings of blood chemistry: Secondary | ICD-10-CM

## 2018-02-04 LAB — CBC WITH DIFFERENTIAL/PLATELET
Abs Immature Granulocytes: 0.22 10*3/uL — ABNORMAL HIGH (ref 0.00–0.07)
Basophils Absolute: 0.1 10*3/uL (ref 0.0–0.1)
Basophils Relative: 0 %
EOS ABS: 0 10*3/uL (ref 0.0–0.5)
Eosinophils Relative: 0 %
HCT: 39 % (ref 39.0–52.0)
Hemoglobin: 12.8 g/dL — ABNORMAL LOW (ref 13.0–17.0)
Immature Granulocytes: 2 %
Lymphocytes Relative: 3 %
Lymphs Abs: 0.4 10*3/uL — ABNORMAL LOW (ref 0.7–4.0)
MCH: 29.8 pg (ref 26.0–34.0)
MCHC: 32.8 g/dL (ref 30.0–36.0)
MCV: 90.9 fL (ref 80.0–100.0)
MONO ABS: 1.1 10*3/uL — AB (ref 0.1–1.0)
Monocytes Relative: 10 %
Neutro Abs: 9.9 10*3/uL — ABNORMAL HIGH (ref 1.7–7.7)
Neutrophils Relative %: 85 %
Platelets: 105 10*3/uL — ABNORMAL LOW (ref 150–400)
RBC: 4.29 MIL/uL (ref 4.22–5.81)
RDW: 15 % (ref 11.5–15.5)
WBC: 11.6 10*3/uL — ABNORMAL HIGH (ref 4.0–10.5)
nRBC: 0 % (ref 0.0–0.2)

## 2018-02-04 LAB — BASIC METABOLIC PANEL
Anion gap: 7 (ref 5–15)
BUN: 29 mg/dL — ABNORMAL HIGH (ref 8–23)
CO2: 17 mmol/L — ABNORMAL LOW (ref 22–32)
CREATININE: 0.9 mg/dL (ref 0.61–1.24)
Calcium: 7.7 mg/dL — ABNORMAL LOW (ref 8.9–10.3)
Chloride: 110 mmol/L (ref 98–111)
GFR calc Af Amer: >60 mL/min (ref >60)
GFR calc non Af Amer: >60 mL/min (ref >60)
Glucose, Bld: 125 mg/dL — ABNORMAL HIGH (ref 70–99)
Potassium: 3.5 mmol/L (ref 3.5–5.1)
Sodium: 134 mmol/L — ABNORMAL LOW (ref 135–145)

## 2018-02-04 LAB — MAGNESIUM: Magnesium: 2.1 mg/dL (ref 1.7–2.4)

## 2018-02-04 LAB — LACTIC ACID, PLASMA: Lactic Acid, Venous: 1.9 mmol/L (ref 0.5–1.9)

## 2018-02-04 MED ORDER — POTASSIUM CHLORIDE 10 MEQ/100ML IV SOLN
10.0000 meq | INTRAVENOUS | Status: AC
  Administered 2018-02-04 (×4): 10 meq via INTRAVENOUS
  Filled 2018-02-04 (×4): qty 100

## 2018-02-04 MED ORDER — AMIODARONE HCL IN DEXTROSE 360-4.14 MG/200ML-% IV SOLN
30.0000 mg/h | INTRAVENOUS | Status: DC
  Administered 2018-02-04 – 2018-02-06 (×5): 30 mg/h via INTRAVENOUS
  Filled 2018-02-04 (×5): qty 200

## 2018-02-04 MED ORDER — SODIUM CHLORIDE 0.9 % IV SOLN
INTRAVENOUS | Status: AC
  Administered 2018-02-04 – 2018-02-05 (×3): via INTRAVENOUS

## 2018-02-04 MED ORDER — SODIUM CHLORIDE 0.9 % IV BOLUS
1000.0000 mL | Freq: Once | INTRAVENOUS | Status: AC
  Administered 2018-02-04: 1000 mL via INTRAVENOUS

## 2018-02-04 MED ORDER — LORAZEPAM 2 MG/ML IJ SOLN
1.0000 mg | Freq: Once | INTRAMUSCULAR | Status: AC
  Administered 2018-02-04: 1 mg via INTRAVENOUS
  Filled 2018-02-04: qty 1

## 2018-02-04 MED ORDER — SODIUM BICARBONATE 8.4 % IV SOLN
50.0000 meq | Freq: Once | INTRAVENOUS | Status: AC
  Administered 2018-02-04: 50 meq via INTRAVENOUS
  Filled 2018-02-04: qty 50

## 2018-02-04 MED ORDER — AMIODARONE HCL IN DEXTROSE 360-4.14 MG/200ML-% IV SOLN
60.0000 mg/h | INTRAVENOUS | Status: AC
  Administered 2018-02-04 (×2): 60 mg/h via INTRAVENOUS
  Filled 2018-02-04: qty 200

## 2018-02-04 MED ORDER — AMIODARONE LOAD VIA INFUSION
150.0000 mg | Freq: Once | INTRAVENOUS | Status: AC
  Administered 2018-02-04: 150 mg via INTRAVENOUS
  Filled 2018-02-04: qty 83.34

## 2018-02-04 MED ORDER — ORAL CARE MOUTH RINSE
15.0000 mL | Freq: Two times a day (BID) | OROMUCOSAL | Status: DC
  Administered 2018-02-05 – 2018-02-11 (×15): 15 mL via OROMUCOSAL

## 2018-02-04 NOTE — Progress Notes (Signed)
Spoke with Dr. Miles CostainShick that UGI could not be done today because there is no RADS in the hospital Sunday.  Test would have to wait until Monday.  Con't NPO.

## 2018-02-04 NOTE — Progress Notes (Signed)
  Amiodarone Drug - Drug Interaction Consult Note  Recommendations: Monitor closely. Since pt with anaphylactic allergy to PCN, will continue with ciprofloxacin and monitor QTc closely and keep Mg>2 and K>4.  Amiodarone is metabolized by the cytochrome P450 system and therefore has the potential to cause many drug interactions. Amiodarone has an average plasma half-life of 50 days (range 20 to 100 days).   There is potential for drug interactions to occur several weeks or months after stopping treatment and the onset of drug interactions may be slow after initiating amiodarone.    [x]   Calcium channel blockers (diltiazem and verapamil): increased risk of bradycardia, AV block and myocardial depression.   [x]  Drugs that prolong the QT interval:  Torsades de pointes risk may be increased with concurrent use - avoid if possible.  Monitor QTc, also keep magnesium/potassium WNL if concurrent therapy can't be avoided. Marland Kitchen. Antibiotics: e.g. fluoroquinolones, erythromycin.  Thank You,  Christoper Fabianaron Tonantzin Mimnaugh, PharmD, BCPS Clinical pharmacist  **Pharmacist phone directory can now be found on [email or web address] ([email or web address]).  Listed under [facility]. 02/04/2018 10:22 AM

## 2018-02-04 NOTE — Progress Notes (Signed)
Progress Note  Patient Name: Manuel Reilly Date of Encounter: 02/04/2018  Primary Cardiologist: Donato SchultzMark Devi Hopman, MD   Subjective   Now delirious overnight.  Spoke to his daughter.  Heart rates have been in the 130s blood pressure in the low 100s, upper 90s.  Inpatient Medications    Scheduled Meds: . mouth rinse  15 mL Mouth Rinse BID  . pantoprazole  40 mg Intravenous Q12H  . sucralfate  1 g Oral Q6H   Continuous Infusions: . ciprofloxacin Stopped (02/04/18 0305)  . dextrose 5 % and 0.45 % NaCl with KCl 10 mEq/L 50 mL/hr at 02/03/18 1224  . diltiazem (CARDIZEM) infusion 5 mg/hr (02/03/18 2206)  . metronidazole 500 mg (02/04/18 0514)  . potassium chloride 10 mEq (02/04/18 0900)   PRN Meds: acetaminophen **OR** acetaminophen, albuterol, ipratropium-albuterol, morphine injection, ondansetron (ZOFRAN) IV   Vital Signs    Vitals:   02/04/18 0300 02/04/18 0344 02/04/18 0400 02/04/18 0500  BP: (!) 96/43  (!) 101/57 (!) 106/35  Pulse: (!) 106  99 98  Resp: 19  19 18   Temp:  97.6 F (36.4 C) 97.6 F (36.4 C)   TempSrc:  Axillary Axillary   SpO2: 95%  100% 100%  Weight:        Intake/Output Summary (Last 24 hours) at 02/04/2018 0947 Last data filed at 02/03/2018 2000 Gross per 24 hour  Intake 1550 ml  Output 200 ml  Net 1350 ml   Filed Weights   02/01/18 0201  Weight: 75.8 kg    Telemetry    Atrial fibrillation with rapid ventricular response, heart rate in the 130s- Personally Reviewed  ECG    Atrial fibrillation- Personally Reviewed  Physical Exam   GEN:  Delirious, in bed, mittens.   Neck: No JVD Cardiac:  Irregularly irregular tachycardic, no murmurs, rubs, or gallops.  Respiratory: Clear to auscultation bilaterally. GI: Soft, nontender, non-distended  MS: No edema; No deformity. Neuro:  Nonfocal  Psych: Delirious  Labs    Chemistry Recent Labs  Lab 01/31/18 1708 02/01/18 0754 02/02/18 0324 02/03/18 0320 02/04/18 0316  NA 133* 135 134*  134* 134*  K 3.6 3.6 3.6 4.0 3.5  CL 98 104 105 109 110  CO2 21* 23 20* 21* 17*  GLUCOSE 73 66* 116* 107* 125*  BUN 38* 42* 32* 28* 29*  CREATININE 1.20 1.21 0.89 0.75 0.90  CALCIUM 8.3* 7.8* 7.7* 7.8* 7.7*  PROT 6.2* 5.6* 5.0*  --   --   ALBUMIN 3.4* 3.2* 2.6*  --   --   AST 70* 46* 27  --   --   ALT 90* 64* 44  --   --   ALKPHOS 102 84 83  --   --   BILITOT 1.1 0.9 1.1  --   --   GFRNONAA 52* 52* >60 >60 >60  GFRAA >60 60* >60 >60 >60  ANIONGAP 14 8 9  4* 7     Hematology Recent Labs  Lab 02/02/18 0324 02/03/18 0320 02/04/18 0316  WBC 9.0 10.8* 11.6*  RBC 4.45 4.24 4.29  HGB 13.6 13.0 12.8*  HCT 41.8 40.0 39.0  MCV 93.9 94.3 90.9  MCH 30.6 30.7 29.8  MCHC 32.5 32.5 32.8  RDW 14.6 14.9 15.0  PLT 127* 106* 105*    Cardiac Enzymes Recent Labs  Lab 01/31/18 1708 02/01/18 0132 02/01/18 0511 02/01/18 1508  TROPONINI 0.09* 0.09* 0.09* 0.14*   No results for input(s): TROPIPOC in the last 168 hours.   BNPNo  results for input(s): BNP, PROBNP in the last 168 hours.   DDimer No results for input(s): DDIMER in the last 168 hours.   Radiology    No results found.  Cardiac Studies   Echo unable to visualize  Patient Profile     84 y.o. male with new onset persistent atrial fibrillation in the setting of perforated peptic ulcer with mildly elevated troponin, right bundle branch block left anterior fascicular block now with delirium  Assessment & Plan    Atrial fibrillation with rapid ventricular response - We will go ahead and initiate IV amiodarone to see if we can help lower his heart rate and potentially help with conversion.  Of course, there is always risk with conversion of stroke.  He is unable to be anticoagulated at this time given his perforated viscus.  Spoke with daughter.  We will continue with IV diltiazem.  Perforated peptic ulcer -Looks like surgery will be taking him into endoscopy.  Elevated troponin -Demand ischemia low level flat.  Right  bundle branch block left intravesicular block -Stable, no pauses.  DNR.  Prior to this admission he was living alone, shopping, driving.     For questions or updates, please contact [facility] Please consult [email or web address] for contact info under        Signed, Donato Schultz, MD  02/04/2018, 9:47 AM

## 2018-02-04 NOTE — Progress Notes (Signed)
Patient ID: Roque CashHoward H Kingma, male   DOB: Nov 17, 1923, 84 y.o.   MRN: [id number]   Spoke with Dr Derrell Lollingamirez, [place] for UGI to be done on Monday.

## 2018-02-04 NOTE — Progress Notes (Signed)
4 Days Post-Op   Subjective/Chief Complaint: Pt with disorientation overnight On dilt gtt   Objective: Vital signs in last 24 hours: Temp:  [97.6 F (36.4 C)-98.7 F (37.1 C)] 97.6 F (36.4 C) (12/01 0400) Pulse Rate:  [75-133] 98 (12/01 0500) Resp:  [18-24] 18 (12/01 0500) BP: (86-166)/(30-121) 106/35 (12/01 0500) SpO2:  [81 %-100 %] 100 % (12/01 0500) Last BM Date: 02/03/18  Intake/Output from previous day: 11/30 0701 - 12/01 0700 In: 1750 [I.V.:750; IV Piggyback:1000] Out: 200 [Urine:200] Intake/Output this shift: No intake/output data recorded.  Constitutional: No acute distress, conversant, appears states age. Eyes: Anicteric sclerae, moist conjunctiva, no lid lag Lungs: Clear to auscultation bilaterally, normal respiratory effort CV: regular rate and rhythm, no murmurs, no peripheral edema, pedal pulses 2+ GI: Soft, no masses or hepatosplenomegaly, non-tender to palpation, no rebound/ guarding Skin: No rashes, palpation reveals normal turgor Psychiatric: appropriate judgment and insight, oriented to person, place, and time   Lab Results:  Recent Labs    02/03/18 0320 02/04/18 0316  WBC 10.8* 11.6*  HGB 13.0 12.8*  HCT 40.0 39.0  PLT 106* 105*   BMET Recent Labs    02/03/18 0320 02/04/18 0316  NA 134* 134*  K 4.0 3.5  CL 109 110  CO2 21* 17*  GLUCOSE 107* 125*  BUN 28* 29*  CREATININE 0.75 0.90  CALCIUM 7.8* 7.7*   PT/INR Recent Labs    02/01/18 0754  LABPROT 17.1*  INR 1.41  Anti-infectives: Anti-infectives (From admission, onward)   Start     Dose/Rate Route Frequency Ordered Stop   02/01/18 1400  ciprofloxacin (CIPRO) IVPB 400 mg     400 mg 200 mL/hr over 60 Minutes Intravenous Every 12 hours 02/01/18 0210     02/01/18 0600  metroNIDAZOLE (FLAGYL) IVPB 500 mg     500 mg 100 mL/hr over 60 Minutes Intravenous Every 8 hours 01/31/18 2318     01/31/18 2130  ciprofloxacin (CIPRO) IVPB 400 mg     400 mg 200 mL/hr over 60 Minutes  Intravenous  Once 01/31/18 2123 02/01/18 0256   01/31/18 2130  metroNIDAZOLE (FLAGYL) IVPB 500 mg     500 mg 100 mL/hr over 60 Minutes Intravenous  Once 01/31/18 2123 02/01/18 0138      Assessment/Plan: 1.  Perforated gastric/duodenal ulcer:  Keep NGT out for now & abdominal pain better; con't abx; strict NPO.  Upper GI today to assess integrity of self patch.  If no leak with trial liquids 2.  HTN: stable 3.  Diverticulosis 4.  Emphysema             On an inhaler 5.  Hard of hearing 6.  Right scrotal hydrocele     LOS: 4 days    Axel Fillerrmando Zoella Roberti 02/04/2018

## 2018-02-04 NOTE — Progress Notes (Signed)
PROGRESS NOTE  Manuel Reilly UEA:[id number] DOB: April 13, 1923 DOA: 01/31/2018 PCP: [name], MD  HPI/Recap of past 24 hours:  He was agitated last night required Ativan currently he is sleeping  1bm documented, patient reports it is just mucous  He remain npo, No vomiting,  no fever Urine output is poor  Remain in afib,   Assessment/Plan: Principal Problem:   Perforated peptic ulcer (HCC) Active Problems:   COPD (chronic obstructive pulmonary disease) (HCC)   Elevated troponin   Pleural effusion   HTN (hypertension)  Perforated peptic ulcer/ileus  -General surgery consulted, conservative management with NG suction/ PPI/cipro/flagyl/ivf  -Keep k >4, keep mag>2 -Pulled out ng the night on 11/28-1/29 -Upper GI series tomorrow ,will follow general surgery recommendation  Hypokalemia: replace k  Metabolic acidosis Will give iv bicarb  New onset of A. Fib -Not a candidate for anticoagulation due to perforated peptic ulcer -Rate control with cardizem drip -Echocardiogram poor quality -BP borderline low not able to tolerate Cardizem drip , cardiology started amiodarone drip -cardiology input appreciated, will follow cards recommendation  Thrombocytopenia: From acute illness? /consumption?/abx? Monitor  AKI: Cr 1.2 on presentation Cr came down to 0.75 ua no infection  Elevated LFT: normalized  Delirium: Has had confusion at night, required prn ativan He is drowsy, not awake today, will get CT head  Daughter states patient lives by himself, still drives and go shopping himself prior to this  Code Status: DNR  Family Communication: patient in the room, daughter vicki over the phone  Disposition Plan: remain in stepdown   Consultants:  General surgery  Critical care  cardiology  Procedures:  none  Antibiotics:  As above   Objective: BP (!) 120/59   Pulse (!) 114   Temp (!) 97.3 F (36.3 C) (Axillary)   Resp 17   Wt 75.8 kg    SpO2 100%   BMI 23.96 kg/m   Intake/Output Summary (Last 24 hours) at 02/04/2018 1327 Last data filed at 02/04/2018 1300 Gross per 24 hour  Intake 5428.28 ml  Output 200 ml  Net 5228.28 ml   Filed Weights   02/01/18 0201  Weight: 75.8 kg    Exam: Patient is examined daily including today on 02/04/2018, exams remain the same as of yesterday except that has changed    General:  Sleeping, not waking up  Cardiovascular: IRRR  Respiratory: CTABL  Abdomen: less distended,  Hypoactive bowel sounds  Musculoskeletal: No Edema  Neuro:  Sleeping, not waking up  Data Reviewed: Basic Metabolic Panel: Recent Labs  Lab 01/31/18 1708 02/01/18 0754 02/02/18 0324 02/03/18 0320 02/04/18 0316  NA 133* 135 134* 134* 134*  K 3.6 3.6 3.6 4.0 3.5  CL 98 104 105 109 110  CO2 21* 23 20* 21* 17*  GLUCOSE 73 66* 116* 107* 125*  BUN 38* 42* 32* 28* 29*  CREATININE 1.20 1.21 0.89 0.75 0.90  CALCIUM 8.3* 7.8* 7.7* 7.8* 7.7*  MG  --  2.0  --  2.1 2.1   Liver Function Tests: Recent Labs  Lab 01/31/18 1708 02/01/18 0754 02/02/18 0324  AST 70* 46* 27  ALT 90* 64* 44  ALKPHOS 102 84 83  BILITOT 1.1 0.9 1.1  PROT 6.2* 5.6* 5.0*  ALBUMIN 3.4* 3.2* 2.6*   Recent Labs  Lab 01/31/18 1708  LIPASE 59*   No results for input(s): AMMONIA in the last 168 hours. CBC: Recent Labs  Lab 01/31/18 1708  02/01/18 0511 02/01/18 0753 02/02/18 0324 02/03/18 0320  02/04/18 0316  WBC 9.9   < > 11.8* 11.4* 9.0 10.8* 11.6*  NEUTROABS 9.2*  --   --   --  8.0*  --  9.9*  HGB 16.7   < > 13.9 14.3 13.6 13.0 12.8*  HCT 50.7   < > 42.2 43.8 41.8 40.0 39.0  MCV 90.7   < > 93.2 94.8 93.9 94.3 90.9  PLT 180   < > 143* 143* 127* 106* 105*   < > = values in this interval not displayed.   Cardiac Enzymes:   Recent Labs  Lab 01/31/18 1708 02/01/18 0132 02/01/18 0511 02/01/18 1508  TROPONINI 0.09* 0.09* 0.09* 0.14*   BNP (last 3 results) No results for input(s): BNP in the last 8760  hours.  ProBNP (last 3 results) No results for input(s): PROBNP in the last 8760 hours.  CBG: No results for input(s): GLUCAP in the last 168 hours.  Recent Results (from the past 240 hour(s))  MRSA PCR Screening     Status: None   Collection Time: 02/01/18  3:00 PM  Result Value Ref Range Status   MRSA by PCR NEGATIVE NEGATIVE Final    Comment:        The GeneXpert MRSA Assay (FDA approved for NASAL specimens only), is one component of a comprehensive MRSA colonization surveillance program. It is not intended to diagnose MRSA infection nor to guide or monitor treatment for MRSA infections. Performed at [facility], [address] [place], [place] [id number]      Studies: No results found.  Scheduled Meds: . mouth rinse  15 mL Mouth Rinse BID  . pantoprazole  40 mg Intravenous Q12H  . sodium bicarbonate  50 mEq Intravenous Once  . sucralfate  1 g Oral Q6H    Continuous Infusions: . sodium chloride    . amiodarone 60 mg/hr (02/04/18 1300)   Followed by  . amiodarone    . ciprofloxacin Stopped (02/04/18 0305)  . diltiazem (CARDIZEM) infusion 5 mg/hr (02/03/18 2206)  . metronidazole Stopped (02/04/18 0615)     Time spent: 35mins I have personally reviewed and interpreted on  02/04/2018 daily labs, tele strips, imagings as discussed above under date review session and assessment and plans.  I reviewed all nursing notes, pharmacy notes, consultant notes,  vitals, pertinent old records  I have discussed plan of care as described above with RN , patient and family on 02/04/2018   Albertine GratesFang Kinta Martis MD, PhD  [facility] Pager [phone]. If 7PM-7AM, please contact night-coverage at [email or web address], password [email or web address] 02/04/2018, 1:27 PM  LOS: 4 days

## 2018-02-05 ENCOUNTER — Inpatient Hospital Stay (HOSPITAL_COMMUNITY): Payer: Medicare Other

## 2018-02-05 LAB — CBC WITH DIFFERENTIAL/PLATELET
Abs Immature Granulocytes: 0.63 10*3/uL — ABNORMAL HIGH (ref 0.00–0.07)
Basophils Absolute: 0.1 10*3/uL (ref 0.0–0.1)
Basophils Relative: 1 %
Eosinophils Absolute: 0 10*3/uL (ref 0.0–0.5)
Eosinophils Relative: 0 %
HCT: 44.3 % (ref 39.0–52.0)
Hemoglobin: 14.5 g/dL (ref 13.0–17.0)
Immature Granulocytes: 3 %
Lymphocytes Relative: 3 %
Lymphs Abs: 0.6 10*3/uL — ABNORMAL LOW (ref 0.7–4.0)
MCH: 29.6 pg (ref 26.0–34.0)
MCHC: 32.7 g/dL (ref 30.0–36.0)
MCV: 90.4 fL (ref 80.0–100.0)
Monocytes Absolute: 1.4 10*3/uL — ABNORMAL HIGH (ref 0.1–1.0)
Monocytes Relative: 7 %
Neutro Abs: 17.3 10*3/uL — ABNORMAL HIGH (ref 1.7–7.7)
Neutrophils Relative %: 86 %
Platelets: 136 10*3/uL — ABNORMAL LOW (ref 150–400)
RBC: 4.9 MIL/uL (ref 4.22–5.81)
RDW: 15.8 % — ABNORMAL HIGH (ref 11.5–15.5)
WBC Morphology: INCREASED
WBC: 20.1 10*3/uL — ABNORMAL HIGH (ref 4.0–10.5)
nRBC: 0 % (ref 0.0–0.2)

## 2018-02-05 LAB — BASIC METABOLIC PANEL
Anion gap: 7 (ref 5–15)
BUN: 31 mg/dL — ABNORMAL HIGH (ref 8–23)
CO2: 20 mmol/L — ABNORMAL LOW (ref 22–32)
Calcium: 8 mg/dL — ABNORMAL LOW (ref 8.9–10.3)
Chloride: 110 mmol/L (ref 98–111)
Creatinine, Ser: 0.78 mg/dL (ref 0.61–1.24)
GFR calc Af Amer: >60 mL/min (ref >60)
Glucose, Bld: 124 mg/dL — ABNORMAL HIGH (ref 70–99)
POTASSIUM: 3.9 mmol/L (ref 3.5–5.1)
Sodium: 137 mmol/L (ref 135–145)

## 2018-02-05 LAB — LACTIC ACID, PLASMA: Lactic Acid, Venous: 1.9 mmol/L (ref 0.5–1.9)

## 2018-02-05 MED ORDER — SODIUM BICARBONATE 8.4 % IV SOLN
50.0000 meq | Freq: Once | INTRAVENOUS | Status: AC
  Administered 2018-02-05: 50 meq via INTRAVENOUS
  Filled 2018-02-05: qty 50

## 2018-02-05 MED ORDER — DILTIAZEM HCL-DEXTROSE 100-5 MG/100ML-% IV SOLN (PREMIX)
5.0000 mg/h | INTRAVENOUS | Status: DC
  Administered 2018-02-05 – 2018-02-08 (×6): 10 mg/h via INTRAVENOUS
  Filled 2018-02-05 (×11): qty 100

## 2018-02-05 MED ORDER — ALPRAZOLAM 0.25 MG PO TABS
0.2500 mg | ORAL_TABLET | Freq: Every evening | ORAL | Status: DC | PRN
  Administered 2018-02-06 – 2018-02-08 (×4): 0.25 mg via ORAL
  Filled 2018-02-05 (×4): qty 1

## 2018-02-05 MED ORDER — ALBUTEROL SULFATE (2.5 MG/3ML) 0.083% IN NEBU: 3.0000 mL | INHALATION_SOLUTION | Freq: Four times a day (QID) | RESPIRATORY_TRACT | Status: DC | PRN

## 2018-02-05 MED ORDER — POTASSIUM CHLORIDE 10 MEQ/100ML IV SOLN
10.0000 meq | INTRAVENOUS | Status: AC
  Administered 2018-02-05 (×2): 10 meq via INTRAVENOUS
  Filled 2018-02-05 (×2): qty 100

## 2018-02-05 MED ORDER — SODIUM CHLORIDE 0.9 % IV SOLN
1.0000 g | Freq: Three times a day (TID) | INTRAVENOUS | Status: DC
  Administered 2018-02-05 – 2018-02-07 (×7): 1 g via INTRAVENOUS
  Filled 2018-02-05 (×8): qty 1

## 2018-02-05 MED ORDER — IOHEXOL 350 MG/ML SOLN
150.0000 mL | Freq: Once | INTRAVENOUS | Status: AC | PRN
  Administered 2018-02-05: 50 mL via ORAL

## 2018-02-05 MED ORDER — DILTIAZEM HCL 100 MG IV SOLR: 5.0000 mg/h | INTRAVENOUS | Status: DC

## 2018-02-05 MED ORDER — SODIUM CHLORIDE 0.9 % IV BOLUS
1000.0000 mL | Freq: Once | INTRAVENOUS | Status: AC
  Administered 2018-02-05: 1000 mL via INTRAVENOUS

## 2018-02-05 MED ORDER — SODIUM CHLORIDE 0.9 % IV SOLN
INTRAVENOUS | Status: DC
  Administered 2018-02-05: 23:00:00 via INTRAVENOUS

## 2018-02-05 MED ORDER — FUROSEMIDE 10 MG/ML IJ SOLN
40.0000 mg | Freq: Once | INTRAMUSCULAR | Status: AC
  Administered 2018-02-05: 40 mg via INTRAVENOUS
  Filled 2018-02-05: qty 4

## 2018-02-05 MED ORDER — DILTIAZEM HCL 100 MG IV SOLR
5.0000 mg/h | INTRAVENOUS | Status: DC
  Filled 2018-02-05 (×2): qty 100

## 2018-02-05 NOTE — Progress Notes (Signed)
Progress Note  Patient Name: Manuel Reilly Date of Encounter: 02/05/2018  Primary Cardiologist: Donato Schultz, MD   Subjective   Patient denies CP   No palpitatsions   Breathing is OK    Inpatient Medications    Scheduled Meds: . mouth rinse  15 mL Mouth Rinse BID  . pantoprazole  40 mg Intravenous Q12H  . sucralfate  1 g Oral Q6H   Continuous Infusions: . sodium chloride 75 mL/hr at 02/05/18 1430  . amiodarone 30 mg/hr (02/05/18 1430)  . aztreonam    . diltiazem (CARDIZEM) infusion 7.5 mg/hr (02/05/18 1430)  . metronidazole 100 mL/hr at 02/05/18 1430   PRN Meds: acetaminophen **OR** acetaminophen, albuterol, ipratropium-albuterol, morphine injection, ondansetron (ZOFRAN) IV   Vital Signs    Vitals:   02/05/18 1100 02/05/18 1200 02/05/18 1300 02/05/18 1400  BP: 127/61 (!) 124/48 (!) 106/49 (!) 109/49  Pulse: (!) 114 (!) 113 (!) 111 (!) 114  Resp: 19 16 18 19   Temp:  (!) 97.1 F (36.2 C)    TempSrc:  Axillary    SpO2: 94% 93% 93% 95%  Weight:      Height:        Intake/Output Summary (Last 24 hours) at 02/05/2018 1444 Last data filed at 02/05/2018 1430 Gross per 24 hour  Intake 5269.68 ml  Output 200 ml  Net 5069.68 ml    Net  +  10 L  Filed Weights   02/01/18 0201  Weight: 75.8 kg    Telemetry    ? Atrial flutter vs St   100s to 110s   Personally Reviewed  ECG    Possible atypcial atrial flutter   With 2:1 AV conduction  RBBB- Personally Reviewed  Physical Exam   GEN:  Awake  Sitting in bed   Neck: JVP is inncreased Cardiac:  RRR  tachycardic, no murmurs, rubs, or gallops.  Respiratory: Clear to auscultation bilaterally. GI: Sl distended   + BS   MS: 1+  Edema UE   Tr LE edema   No deformity. Neuro:  Nonfocal    Labs    Chemistry Recent Labs  Lab 01/31/18 1708 02/01/18 0754 02/02/18 0324 02/03/18 0320 02/04/18 0316 02/05/18 0356  NA 133* 135 134* 134* 134* 137  K 3.6 3.6 3.6 4.0 3.5 3.9  CL 98 104 105 109 110 110  CO2 21* 23  20* 21* 17* 20*  GLUCOSE 73 66* 116* 107* 125* 124*  BUN 38* 42* 32* 28* 29* 31*  CREATININE 1.20 1.21 0.89 0.75 0.90 0.78  CALCIUM 8.3* 7.8* 7.7* 7.8* 7.7* 8.0*  PROT 6.2* 5.6* 5.0*  --   --   --   ALBUMIN 3.4* 3.2* 2.6*  --   --   --   AST 70* 46* 27  --   --   --   ALT 90* 64* 44  --   --   --   ALKPHOS 102 84 83  --   --   --   BILITOT 1.1 0.9 1.1  --   --   --   GFRNONAA 52* 52* >60 >60 >60 >60  GFRAA >60 60* >60 >60 >60 >60  ANIONGAP 14 8 9  4* 7 7     Hematology Recent Labs  Lab 02/03/18 0320 02/04/18 0316 02/05/18 0356  WBC 10.8* 11.6* 20.1*  RBC 4.24 4.29 4.90  HGB 13.0 12.8* 14.5  HCT 40.0 39.0 44.3  MCV 94.3 90.9 90.4  MCH 30.7 29.8 29.6  MCHC  32.5 32.8 32.7  RDW 14.9 15.0 15.8*  PLT 106* 105* 136*    Cardiac Enzymes Recent Labs  Lab 01/31/18 1708 02/01/18 0132 02/01/18 0511 02/01/18 1508  TROPONINI 0.09* 0.09* 0.09* 0.14*   No results for input(s): TROPIPOC in the last 168 hours.   BNPNo results for input(s): BNP, PROBNP in the last 168 hours.   DDimer No results for input(s): DDIMER in the last 168 hours.   Radiology    Ct Head Wo Contrast  Result Date: 02/04/2018 CLINICAL DATA:  Disorientation. EXAM: CT HEAD WITHOUT CONTRAST TECHNIQUE: Contiguous axial images were obtained from the base of the skull through the vertex without intravenous contrast. COMPARISON:  None. FINDINGS: Brain: There is no evidence of acute infarct, intracranial hemorrhage, mass, midline shift, or extra-axial fluid collection. There is a chronic lacunar infarct in the left caudate nucleus. Scattered cerebral white matter hypodensities are nonspecific but compatible with mild chronic small vessel ischemic disease. Mild cerebral atrophy is not greater than expected for age. Vascular: Calcified atherosclerosis at the skull base. No hyperdense vessel. Skull: No fracture or suspicious osseous lesion. Sinuses/Orbits: No acute findings. Bilateral cataract extraction. Other: None.  IMPRESSION: 1. No evidence of acute intracranial abnormality. 2. Mild chronic small vessel ischemic disease. Electronically Signed   By: Sebastian Ache M.D.   On: 02/04/2018 15:31   Dg Chest Port 1 View  Result Date: 02/05/2018 CLINICAL DATA:  Hypoxia.  Right pleural effusion.  Emphysema. EXAM: PORTABLE CHEST 1 VIEW COMPARISON:  01/31/2018 FINDINGS: Heart size remains normal. Today's film is lordotically positioned. Question increased density in the lower lobes bilaterally which could go along with some worsened atelectasis. Background scarring and emphysematous change as seen previously. IMPRESSION: Question worsened atelectasis in both lower lungs. Lordotically position film. Electronically Signed   By: Paulina Fusi M.D.   On: 02/05/2018 09:50   Dg Kayleen Memos  W/kub  Result Date: 02/05/2018 CLINICAL DATA:  Perforated duodenal ulcer, evaluate for sealing EXAM: WATER SOLUBLE UPPER GI SERIES TECHNIQUE: Single-column upper GI series was performed using water soluble contrast. CONTRAST:  Isotonic water-soluble contrast COMPARISON:  Abdominal CT 01/31/2018 FLUOROSCOPY TIME:  Fluoroscopy Time:  3 minutes 6 seconds Radiation Exposure Index (if provided by the fluoroscopic device): 28.1 mGy Number of Acquired Spot Images: 3 FINDINGS: KUB shows a normal bowel gas pattern. The patient had difficulty swallowing recumbent such that took a few swallows via straw sitting up and then was returned recumbent for imaging. The contrast accumulated within the fundus, requiring right lateral decubitus imaging to advance into the antrum. The stomach was moderately distended by gas. Slow passage of contrast through the pylorus, likely due to edema, with no extravasation seen. Duodenal diverticula are present. A discrete ulcer was not identified. Gastroesophageal reflux incidentally noted. IMPRESSION: 1. Negative for gastric or duodenal extravasation. 2. Numerous duodenal diverticula. Electronically Signed   By: Marnee Spring M.D.   On:  02/05/2018 11:22    Cardiac Studies   Echo unable to visualize  Patient Profile     84 y.o. male with new onset persistent atrial fibrillation in the setting of perforated peptic ulcer with mildly elevated troponin, right bundle branch block left anterior fascicular block now with delirium  Assessment & Plan    Atrial fibrillation with rapid ventricular response Tele currently in 100s    EKG   ? Atrial flutter   I would continue IV dilt  Increase rate for better control   Continue IV amiodarone    Not a candidate for  anticoagulatioin    Perforated peptic ulcer Surgery following   He is 10 L positive   With IV continuing    I would give IV lasix x 1 with edema.    Elevated troponin -Demand ischemia low level flat.  Right bundle branch block left intravesicular block -Stable, no pauses.  DNR.    For questions or updates, please contact [facility] Please consult [email or web address] for contact info under        Signed, Dietrich PatesPaula Lauraine Crespo, MD  02/05/2018, 2:44 PM

## 2018-02-05 NOTE — Progress Notes (Signed)
UGI:  1. Negative for gastric or duodenal extravasation. 2. Numerous duodenal diverticula.  Plan:  Start some clears, IS, OOB, PT/OT/Speech eval.

## 2018-02-05 NOTE — Progress Notes (Signed)
PROGRESS NOTE  Manuel Reilly RJJ:[id number] DOB: 05/19/1923 DOA: 01/31/2018 PCP: [name], MD  HPI/Recap of past 24 hours:  He has mittens on for confusion last night, this am he is alert, oriented and appropriate Daughter reports he passed gas, patient denies ab pain   He remains npo, No vomiting,  no fever  Urine output is poor, wbc elevated  Remain in afib, bp is better  Assessment/Plan: Principal Problem:   Perforated peptic ulcer (HCC) Active Problems:   COPD (chronic obstructive pulmonary disease) (HCC)   Elevated troponin   Pleural effusion   HTN (hypertension)  Perforated peptic ulcer/ileus  -General surgery consulted, conservative management with NG suction/ PPI/cipro/flagyl/ivf ,he Pulled out ng the night on 11/28-1/29 -Keep k >4, keep mag>2 --Upper GI series planned today ,will follow general surgery recommendation  Hypokalemia: replace k, keep about 4  Metabolic acidosis Will give another dose iv bicarb  New onset of A. Fib -Not a candidate for anticoagulation due to perforated peptic ulcer -Rate control with cardizem drip -Echocardiogram poor quality -BP borderline low not able to tolerate Cardizem drip , cardiology started amiodarone drip -cardiology input appreciated, will follow cards recommendation  Leukocytosis: No fever, will get cxr, blood culture  Thrombocytopenia: From acute illness? /consumption?/abx? Monitor  AKI: Cr 1.2 on presentation Cr came down to 0.78 ua no infection  Elevated LFT: normalized  Delirium: Has had confusion at night, required prn ativan CT head no acute findings  Daughter states patient lives by himself, still drives and go shopping himself prior to this  Code Status: DNR  Family Communication: patient in the room, daughter vicki at bedside  Disposition Plan: remain in stepdown   Consultants:  General surgery  Critical care  cardiology  Procedures:  none  Antibiotics:  As  above   Objective: BP (!) 131/54   Pulse (!) 112   Temp (!) 97.2 F (36.2 C) (Axillary)   Resp (!) 22   Ht 5\' 9"  (1.753 m)   Wt 75.8 kg   SpO2 96%   BMI 24.66 kg/m   Intake/Output Summary (Last 24 hours) at 02/05/2018 0847 Last data filed at 02/05/2018 0300 Gross per 24 hour  Intake 6159.1 ml  Output 400 ml  Net 5759.1 ml   Filed Weights   02/01/18 0201  Weight: 75.8 kg    Exam: Patient is examined daily including today on 02/05/2018, exams remain the same as of yesterday except that has changed    General:  Alert, awake and appropriate   Cardiovascular: IRRR  Respiratory: diminished at basis, no wheezing, no rales  Abdomen:  distended,  Hypoactive bowel sounds  Musculoskeletal: No Edema  Neuro:  Alert, awake and appropriate   Data Reviewed: Basic Metabolic Panel: Recent Labs  Lab 02/01/18 0754 02/02/18 0324 02/03/18 0320 02/04/18 0316 02/05/18 0356  NA 135 134* 134* 134* 137  K 3.6 3.6 4.0 3.5 3.9  CL 104 105 109 110 110  CO2 23 20* 21* 17* 20*  GLUCOSE 66* 116* 107* 125* 124*  BUN 42* 32* 28* 29* 31*  CREATININE 1.21 0.89 0.75 0.90 0.78  CALCIUM 7.8* 7.7* 7.8* 7.7* 8.0*  MG 2.0  --  2.1 2.1  --    Liver Function Tests: Recent Labs  Lab 01/31/18 1708 02/01/18 0754 02/02/18 0324  AST 70* 46* 27  ALT 90* 64* 44  ALKPHOS 102 84 83  BILITOT 1.1 0.9 1.1  PROT 6.2* 5.6* 5.0*  ALBUMIN 3.4* 3.2* 2.6*   Recent  Labs  Lab 01/31/18 1708  LIPASE 59*   No results for input(s): AMMONIA in the last 168 hours. CBC: Recent Labs  Lab 01/31/18 1708  02/01/18 0753 02/02/18 0324 02/03/18 0320 02/04/18 0316 02/05/18 0356  WBC 9.9   < > 11.4* 9.0 10.8* 11.6* 20.1*  NEUTROABS 9.2*  --   --  8.0*  --  9.9* 17.3*  HGB 16.7   < > 14.3 13.6 13.0 12.8* 14.5  HCT 50.7   < > 43.8 41.8 40.0 39.0 44.3  MCV 90.7   < > 94.8 93.9 94.3 90.9 90.4  PLT 180   < > 143* 127* 106* 105* 136*   < > = values in this interval not displayed.   Cardiac Enzymes:   Recent  Labs  Lab 01/31/18 1708 02/01/18 0132 02/01/18 0511 02/01/18 1508  TROPONINI 0.09* 0.09* 0.09* 0.14*   BNP (last 3 results) No results for input(s): BNP in the last 8760 hours.  ProBNP (last 3 results) No results for input(s): PROBNP in the last 8760 hours.  CBG: No results for input(s): GLUCAP in the last 168 hours.  Recent Results (from the past 240 hour(s))  MRSA PCR Screening     Status: None   Collection Time: 02/01/18  3:00 PM  Result Value Ref Range Status   MRSA by PCR NEGATIVE NEGATIVE Final    Comment:        The GeneXpert MRSA Assay (FDA approved for NASAL specimens only), is one component of a comprehensive MRSA colonization surveillance program. It is not intended to diagnose MRSA infection nor to guide or monitor treatment for MRSA infections. Performed at [facility], [address] [place], [place] [address]      Studies: Ct Head Wo Contrast  Result Date: 02/04/2018 CLINICAL DATA:  Disorientation. EXAM: CT HEAD WITHOUT CONTRAST TECHNIQUE: Contiguous axial images were obtained from the base of the skull through the vertex without intravenous contrast. COMPARISON:  None. FINDINGS: Brain: There is no evidence of acute infarct, intracranial hemorrhage, mass, midline shift, or extra-axial fluid collection. There is a chronic lacunar infarct in the left caudate nucleus. Scattered cerebral white matter hypodensities are nonspecific but compatible with mild chronic small vessel ischemic disease. Mild cerebral atrophy is not greater than expected for age. Vascular: Calcified atherosclerosis at the skull base. No hyperdense vessel. Skull: No fracture or suspicious osseous lesion. Sinuses/Orbits: No acute findings. Bilateral cataract extraction. Other: None. IMPRESSION: 1. No evidence of acute intracranial abnormality. 2. Mild chronic small vessel ischemic disease. Electronically Signed   By: Sebastian AcheAllen  Grady M.D.   On: 02/04/2018 15:31    Scheduled  Meds: . mouth rinse  15 mL Mouth Rinse BID  . pantoprazole  40 mg Intravenous Q12H  . sucralfate  1 g Oral Q6H    Continuous Infusions: . sodium chloride 75 mL/hr at 02/05/18 0512  . amiodarone 30 mg/hr (02/04/18 2226)  . ciprofloxacin Stopped (02/05/18 0203)  . diltiazem (CARDIZEM) infusion 5 mg/hr (02/04/18 1743)  . metronidazole Stopped (02/05/18 0612)  . sodium chloride 1,000 mL (02/05/18 0827)     Time spent: 35mins I have personally reviewed and interpreted on  02/05/2018 daily labs, tele strips, imagings as discussed above under date review session and assessment and plans.  I reviewed all nursing notes, pharmacy notes, consultant notes,  vitals, pertinent old records  I have discussed plan of care as described above with RN , patient and family on 02/05/2018   Albertine GratesFang Jahmil Macleod MD, PhD  [facility] Pager [phone]. If 7PM-7AM, please contact night-coverage at [email or web address], password [facility] 02/05/2018, 8:47 AM  LOS: 5 days

## 2018-02-05 NOTE — Plan of Care (Signed)
  Problem: Pain Managment: Goal: General experience of comfort will improve Outcome: Progressing   

## 2018-02-05 NOTE — Progress Notes (Addendum)
5 Days Post-Op    CC:  Abdominal pain and tenderness  Subjective: Pt and daughter were both asleep when I came in.  Both are frustrated and tired of his being in bed.  He was confused and still has Mittens on.  Denies any abdominal pain right now.  I explained plans for UGi with daughter.  I also told her WBC was up.    Objective: Vital signs in last 24 hours: Temp:  [97.3 F (36.3 C)-98 F (36.7 C)] 97.4 F (36.3 C) (12/02 0400) Pulse Rate:  [108-122] 112 (12/02 0400) Resp:  [16-26] 22 (12/02 0400) BP: (92-143)/(37-90) 131/54 (12/02 0400) SpO2:  [89 %-100 %] 96 % (12/02 0400) Last BM Date: 02/04/18 6653 IV 400 urine recorded Afebrile, Tachycardic HR 100's, SBP 90-130's Sat's good BMP stable, Creatinine 0.78 WBC up to 20.1K H/H stable up some Lactate 1.9 CT 11/27:Findings highly suspicious for perforated peptic ulcer with small foci of extraluminal air in the porta hepatis and right upper quadrant, wall thickening of the peri pyloric stomach and proximal duodenum. No organized abscess. Mild gallbladder wall thickening and adjacent stranding is likely reactive secondary to gastric/duodenal inflammation, similar to slightly improved from prior exam.  Small right pleural effusion.  Emphysema at the lung bases with round atelectasis in the left lower lobe.  CT head12/1:    No evidence of acute intracranial abnormality.  Mild chronic small vessel ischemic disease  Intake/Output from previous day: 12/01 0701 - 12/02 0700 In: 6653.1 [I.V.:3603.6; IV Piggyback:3049.5] Out: 400 [Urine:400] Intake/Output this shift: No intake/output data recorded.  General appearance: alert, cooperative and confused some but waking up and denies any abdominal pain. Resp: clear to auscultation bilaterally and anterior exam Cardio: AF HR in the 100's GI: soft, may be a little distended, BS high pitched.    Lab Results:  Recent Labs    02/04/18 0316 02/05/18 0356  WBC 11.6* 20.1*  HGB 12.8*  14.5  HCT 39.0 44.3  PLT 105* 136*    BMET Recent Labs    02/04/18 0316 02/05/18 0356  NA 134* 137  K 3.5 3.9  CL 110 110  CO2 17* 20*  GLUCOSE 125* 124*  BUN 29* 31*  CREATININE 0.90 0.78  CALCIUM 7.7* 8.0*   PT/INR No results for input(s): LABPROT, INR in the last 72 hours.  Recent Labs  Lab 01/31/18 1708 02/01/18 0754 02/02/18 0324  AST 70* 46* 27  ALT 90* 64* 44  ALKPHOS 102 84 83  BILITOT 1.1 0.9 1.1  PROT 6.2* 5.6* 5.0*  ALBUMIN 3.4* 3.2* 2.6*     Lipase     Component Value Date/Time   LIPASE 59 (H) 01/31/2018 1708     Medications: . mouth rinse  15 mL Mouth Rinse BID  . pantoprazole  40 mg Intravenous Q12H  . sucralfate  1 g Oral Q6H   . sodium chloride 75 mL/hr at 02/05/18 0512  . amiodarone 30 mg/hr (02/04/18 2226)  . ciprofloxacin Stopped (02/05/18 0203)  . diltiazem (CARDIZEM) infusion 5 mg/hr (02/04/18 1743)  . metronidazole Stopped (02/05/18 0612)  . sodium chloride     Anti-infectives (From admission, onward)   Start     Dose/Rate Route Frequency Ordered Stop   02/01/18 1400  ciprofloxacin (CIPRO) IVPB 400 mg     400 mg 200 mL/hr over 60 Minutes Intravenous Every 12 hours 02/01/18 0210     02/01/18 0600  metroNIDAZOLE (FLAGYL) IVPB 500 mg     500 mg 100 mL/hr over  60 Minutes Intravenous Every 8 hours 01/31/18 2318     01/31/18 2130  ciprofloxacin (CIPRO) IVPB 400 mg     400 mg 200 mL/hr over 60 Minutes Intravenous  Once 01/31/18 2123 02/01/18 0256   01/31/18 2130  metroNIDAZOLE (FLAGYL) IVPB 500 mg     500 mg 100 mL/hr over 60 Minutes Intravenous  Once 01/31/18 2123 02/01/18 0138      Assessment/Plan Hx RIH  Repair 2006/colonoscopy 2004 - Dr. 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    - Upper GI  02/04/18 to assess integrity of self patch  FEN:  IV fluids/NPO ID:  Cipro 11/28 =>> day 5/Flagyl  11/27 =>> day 6 DVT: none SCD's ordered Follow up:  TBD  Plan:  Plan:  For UGI this AM      LOS: 5 days    Tanasia Budzinski 02/05/2018 [phone]

## 2018-02-06 ENCOUNTER — Inpatient Hospital Stay (HOSPITAL_COMMUNITY): Payer: Medicare Other

## 2018-02-06 ENCOUNTER — Encounter (HOSPITAL_COMMUNITY): Payer: Self-pay | Admitting: *Deleted

## 2018-02-06 LAB — CBC
HCT: 42 % (ref 39.0–52.0)
Hemoglobin: 13.6 g/dL (ref 13.0–17.0)
MCH: 29.7 pg (ref 26.0–34.0)
MCHC: 32.4 g/dL (ref 30.0–36.0)
MCV: 91.7 fL (ref 80.0–100.0)
Platelets: 161 10*3/uL (ref 150–400)
RBC: 4.58 MIL/uL (ref 4.22–5.81)
RDW: 16.3 % — AB (ref 11.5–15.5)
WBC: 19.1 10*3/uL — AB (ref 4.0–10.5)
nRBC: 0 % (ref 0.0–0.2)

## 2018-02-06 LAB — BASIC METABOLIC PANEL
Anion gap: 11 (ref 5–15)
BUN: 35 mg/dL — ABNORMAL HIGH (ref 8–23)
CO2: 16 mmol/L — ABNORMAL LOW (ref 22–32)
Calcium: 7.8 mg/dL — ABNORMAL LOW (ref 8.9–10.3)
Chloride: 112 mmol/L — ABNORMAL HIGH (ref 98–111)
Creatinine, Ser: 0.94 mg/dL (ref 0.61–1.24)
GFR calc Af Amer: >60 mL/min (ref >60)
GFR calc non Af Amer: >60 mL/min (ref >60)
Glucose, Bld: 119 mg/dL — ABNORMAL HIGH (ref 70–99)
Potassium: 4.3 mmol/L (ref 3.5–5.1)
Sodium: 139 mmol/L (ref 135–145)

## 2018-02-06 LAB — MAGNESIUM: Magnesium: 2.1 mg/dL (ref 1.7–2.4)

## 2018-02-06 MED ORDER — SODIUM BICARBONATE 8.4 % IV SOLN
50.0000 meq | Freq: Two times a day (BID) | INTRAVENOUS | Status: AC
  Administered 2018-02-06 – 2018-02-08 (×4): 50 meq via INTRAVENOUS
  Filled 2018-02-06 (×4): qty 50

## 2018-02-06 MED ORDER — FUROSEMIDE 10 MG/ML IJ SOLN
40.0000 mg | Freq: Once | INTRAMUSCULAR | Status: AC
  Administered 2018-02-06: 40 mg via INTRAVENOUS
  Filled 2018-02-06: qty 4

## 2018-02-06 MED ORDER — ALBUTEROL SULFATE (2.5 MG/3ML) 0.083% IN NEBU
2.5000 mg | INHALATION_SOLUTION | Freq: Two times a day (BID) | RESPIRATORY_TRACT | Status: DC
  Administered 2018-02-07 – 2018-02-08 (×4): 2.5 mg via RESPIRATORY_TRACT
  Filled 2018-02-06 (×4): qty 3

## 2018-02-06 MED ORDER — AMIODARONE HCL IN DEXTROSE 360-4.14 MG/200ML-% IV SOLN
30.0000 mg/h | INTRAVENOUS | Status: DC
  Administered 2018-02-06: 60 mg/h via INTRAVENOUS
  Administered 2018-02-07 – 2018-02-10 (×6): 30 mg/h via INTRAVENOUS
  Filled 2018-02-06 (×9): qty 200

## 2018-02-06 NOTE — Progress Notes (Signed)
Initial Nutrition Assessment  DOCUMENTATION CODES:   Not applicable  INTERVENTION:  - Will monitor for plan concerning diet vs need for NGT with TF.   NUTRITION DIAGNOSIS:   Inadequate protein intake related to other (see comment)(current diet order) as evidenced by other (comment)(CLD does not meet estimated protein need).  GOAL:   Patient will meet greater than or equal to 90% of their needs  MONITOR:   Diet advancement, Weight trends, Labs  REASON FOR ASSESSMENT:   Consult Assessment of nutrition requirement/status(supplements while on CLD)  ASSESSMENT:   84 y.o. male with medical history significant of diverticulosis, inguinal hernia repair, colon polyps, hemorrhoids, HTN, COPD, and anxiety. He presented to the ED for evaluation of abdominal pain which has been intermittently occurring x6 months. Pain is worse after eating.  Also reports having chills. Denies having any nausea or vomiting.  BMI indicates normal weight. Diet advanced from NPO to CLD yesterday at 1:30 PM with no documented intakes. Will, Surgery PA, mentioned that patient was only able to get in 1 cup of water yesterday, nothing with nutritional value. He has requested RD assist with this and order nutrition supplements.  Patient was unable to provide information or history. Daughter was at bedside and provided all information. She states that PTA patient was living alone, driving, and was very independent. His other daughter would provide breakfast daily (bacon and eggs, for example) and patient would make a sandwich for lunch and then make himself a meal for dinner. He had no chewing or swallowing issues PTA. Daughter does not feel that patient has lost weight recently and does not feel that he physically looks any different. Patient's current weight is 167 lb and, per chart review, he weighed 170 lb on 09/09/17.  SLP evaluating patient at the time of RD visit. See SLP note for information on the encounter. RD  communicated with SLP after exiting patient's room and she reports concern that he is aspirating and does not feel that patient is safe for PO intakes at this time.   Patient with perforated gastric/duodenal ulcer. He has hx of esophageal impaction. Upper GI was done 12/2 and was negative so patient started on CLD.    Medications reviewed; 40 mg IV Lasix x1 dose 12/2 and x1 dose 12/3, 40 mg IV Protonix BID, 10 mEq IV KCl x2 runs 12/2, 1 g Carafate QID.  Labs reviewed; Cl: 112 mmol/L, BUN: 35 mg/dL, Ca: 7.8 mg/dL.      NUTRITION - FOCUSED PHYSICAL EXAM:    Most Recent Value  Orbital Region  No depletion  Upper Arm Region  No depletion  Thoracic and Lumbar Region  Unable to assess  Buccal Region  No depletion  Temple Region  No depletion  Clavicle Bone Region  Mild depletion  Clavicle and Acromion Bone Region  Mild depletion  Scapular Bone Region  Unable to assess  Dorsal Hand  No depletion  Patellar Region  No depletion  Anterior Thigh Region  Unable to assess  Posterior Calf Region  No depletion  Edema (RD Assessment)  Mild [BLE]  Hair  Reviewed  Eyes  Reviewed  Mouth  Reviewed  Skin  Reviewed  Nails  Reviewed       Diet Order:   Diet Order            Diet clear liquid Room service appropriate? Yes; Fluid consistency: Thin  Diet effective now              EDUCATION NEEDS:  No education needs have been identified at this time  Skin:  Skin Assessment: Reviewed RN Assessment  Last BM:  12/3  Height:   Ht Readings from Last 1 Encounters:  02/04/18 5\' 9"  (1.753 m)    Weight:   Wt Readings from Last 1 Encounters:  02/01/18 75.8 kg    Ideal Body Weight:  72.73 kg  BMI:  Body mass index is 24.66 kg/m.  Estimated Nutritional Needs:   Kcal:  1610-9604 kcal  Protein:  75-90 grams  Fluid:  >/= 1.8 L/day     Trenton Gammon, MS, RD, LDN, [facility] Inpatient Clinical Dietitian Pager # [phone] After hours/weekend pager # [phone]

## 2018-02-06 NOTE — Evaluation (Signed)
Clinical/Bedside Swallow Evaluation Patient Details  Name: Manuel Reilly MRN: [id number] Date of Birth: Dec 10, 1923  Today's Date: 02/06/2018 Time: SLP Start Time (ACUTE ONLY): 1147 SLP Stop Time (ACUTE ONLY): 1220 SLP Time Calculation (min) (ACUTE ONLY): 33 min  Past Medical History:  Past Medical History:  Diagnosis Date  . Allergy   . Anxiety   . Diverticulosis   . History of colon polyps   . History of hemorrhoids   . History of inguinal herniorrhaphy    right  . Hyperlipidemia   . Hypogonadism, male   . Sciatica    right   Past Surgical History:  Past Surgical History:  Procedure Laterality Date  . CARPAL TUNNEL RELEASE    . HERNIA REPAIR     HPI:  pt is a 84 yo male [facility] with abdomen pain- found to have perforated peptic ulcer.   Today pt with increased ATX at lung bases, WBC elevation.  Swallow evaluation ordered.   Assessment / Plan / Recommendation Clinical Impression  Patient is presenting with indications of pharyngeal - ? cervical esophageal dysphagia c/b multiple swallows across each bolus and post-swallow coughing.  Intermittent belching also noted.  Daughter reported patient was eating well prior to admit and did not have dysphagia.  Pt noted to be dysarthric with weak voice however and daughter reports this is "close to normal" causing SLP to question some underlying baseline deficits that were not diagnosed.    Recommend pt be NPO x ice chips and needed medications and proceed with MBS to allow family/pt to make informed decisions regarding pt's care plan/dysphagia.  RN informed, PA with Surgery informed as well as hospitalist.  Will plan MBS this pm.   SLP Visit Diagnosis: Dysphagia, unspecified (R13.10)    Aspiration Risk  Severe aspiration risk;Risk for inadequate nutrition/hydration    Diet Recommendation NPO(x ice chips and medications)   Liquid Administration via: Spoon Medication Administration: (? crushed with liquids)    Other   Recommendations Oral Care Recommendations: Oral care QID   Follow up Recommendations (tbd)      Frequency and Duration   tbd         Prognosis   tbd     Swallow Study   General Date of Onset: 02/06/18 HPI: pt is a 84 yo male [facility] with abdomen pain- found to have perforated peptic ulcer.   Today pt with increased ATX at lung bases, WBC elevation.  Swallow evaluation ordered. Type of Study: Bedside Swallow Evaluation Diet Prior to this Study: Thin liquids(clears) Temperature Spikes Noted: No Respiratory Status: Nasal cannula History of Recent Intubation: No Behavior/Cognition: Alert;Cooperative;Pleasant mood Oral Cavity Assessment: Within Functional Limits Oral Care Completed by SLP: No Oral Cavity - Dentition: Missing dentition(uses upper denture) Vision: Impaired for self-feeding Self-Feeding Abilities: Total assist Patient Positioning: Upright in bed Baseline Vocal Quality: Low vocal intensity Volitional Cough: Weak Volitional Swallow: Unable to elicit    Oral/Motor/Sensory Function Overall Oral Motor/Sensory Function: Generalized oral weakness   Ice Chips Ice chips: Not tested   Thin Liquid Thin Liquid: Impaired Presentation: Cup;Spoon;Straw Pharyngeal  Phase Impairments: Cough - Immediate;Cough - Delayed;Multiple swallows;Decreased hyoid-laryngeal movement    Nectar Thick Nectar Thick Liquid: Not tested   Honey Thick Honey Thick Liquid: Not tested   Puree Puree: Not tested   Solid     Solid: Not tested      [name], Korinna Tat Ann 02/06/2018,12:42 PM   Donavan Burnetamara Alfred Eckley, MS [facility] SLP Acute Rehab Services Pager [phone] Office  [phone]   

## 2018-02-06 NOTE — Progress Notes (Signed)
6 Days Post-Op    CC: Abdominal pain and tenderness  Subjective: Patient is lying in bed.  He looks frail and tired.  He denies any abdominal pain.  He was placed on clears yesterday but it sounds like he is only got about 1 cup of water in.  He did have a bowel movement yesterday.  Objective: Vital signs in last 24 hours: Temp:  [96.6 F (35.9 C)-97.4 F (36.3 C)] 97.4 F (36.3 C) (12/03 0800) Pulse Rate:  [111-116] 114 (12/03 0800) Resp:  [13-25] 19 (12/03 0800) BP: (104-138)/(41-78) 126/55 (12/03 0800) SpO2:  [89 %-96 %] 93 % (12/03 0800) Last BM Date: 02/06/18 PO not recorded 4444 IV 750 urine BM x 2 I/O has of about 11 L positive. Afebrile, still tachycardic WBC is 19.9 H/H stable, BMP stable CXR 12/2:  Question worsened atelectasis in both lower lungs. Lordotically position film.   Intake/Output from previous day: 12/02 0701 - 12/03 0700 In: 4444.7 [I.V.:2610.1; IV Piggyback:1834.6] Out: 750 [Urine:750] Intake/Output this shift: Total I/O In: 146.7 [I.V.:46.7; IV Piggyback:100] Out: 60 [Urine:60]  General appearance: alert, cooperative, no distress and It takes 2 people to turn him over on his side to listen to his lungs. Resp: He has some wheezing that she can hear without the stethoscope.  And some rales in his bases. Cardio: Still in AF heart rate better controlled. Extremities: Trace edema both lower extremities  Lab Results:  Recent Labs    02/05/18 0356 02/06/18 0258  WBC 20.1* 19.1*  HGB 14.5 13.6  HCT 44.3 42.0  PLT 136* 161    BMET Recent Labs    02/05/18 0356 02/06/18 0258  NA 137 139  K 3.9 4.3  CL 110 112*  CO2 20* 16*  GLUCOSE 124* 119*  BUN 31* 35*  CREATININE 0.78 0.94  CALCIUM 8.0* 7.8*   PT/INR No results for input(s): LABPROT, INR in the last 72 hours.  Recent Labs  Lab 01/31/18 1708 02/01/18 0754 02/02/18 0324  AST 70* 46* 27  ALT 90* 64* 44  ALKPHOS 102 84 83  BILITOT 1.1 0.9 1.1  PROT 6.2* 5.6* 5.0*  ALBUMIN  3.4* 3.2* 2.6*     Lipase     Component Value Date/Time   LIPASE 59 (H) 01/31/2018 1708     Medications: . mouth rinse  15 mL Mouth Rinse BID  . pantoprazole  40 mg Intravenous Q12H  . sucralfate  1 g Oral Q6H   . sodium chloride 70 mL/hr at 02/06/18 0400  . amiodarone 30 mg/hr (02/06/18 0800)  . aztreonam Stopped (02/06/18 0555)  . diltiazem (CARDIZEM) infusion 10 mg/hr (02/06/18 0754)  . metronidazole 500 mg (02/06/18 0753)   Anti-infectives (From admission, onward)   Start     Dose/Rate Route Frequency Ordered Stop   02/05/18 1430  aztreonam (AZACTAM) 1 g in sodium chloride 0.9 % 100 mL IVPB     1 g 200 mL/hr over 30 Minutes Intravenous Every 8 hours 02/05/18 1419     02/01/18 1400  ciprofloxacin (CIPRO) IVPB 400 mg  Status:  Discontinued     400 mg 200 mL/hr over 60 Minutes Intravenous Every 12 hours 02/01/18 0210 02/05/18 1420   02/01/18 0600  metroNIDAZOLE (FLAGYL) IVPB 500 mg     500 mg 100 mL/hr over 60 Minutes Intravenous Every 8 hours 01/31/18 2318     01/31/18 2130  ciprofloxacin (CIPRO) IVPB 400 mg     400 mg 200 mL/hr over 60 Minutes Intravenous  Once  01/31/18 2123 02/01/18 0256   01/31/18 2130  metroNIDAZOLE (FLAGYL) IVPB 500 mg     500 mg 100 mL/hr over 60 Minutes Intravenous  Once 01/31/18 2123 02/01/18 0138      Assessment/Plan Hx RIH  Repair 2006/colonoscopy 2004 - Dr. Russella Dar Delirium - Ativan New AF w RVR/ elevated Troponin/demand ischemia -  Amiodarone/Cardizem HTN: stable BP but low Diverticulosis COPD/Emphysema - On an inhaler -CXR 12/2: Worsening atelectasis both lower lungs Hard of hearing Right scrotal hydrocele    Perforated gastric/duodenalulcer: Hx of esophageal impaction/Duodenal inflamation 09/2017  -  Keep NGT out for now &abdominal pain better; con't abx; strict NPO.   - Upper GI  02/05/18 to assess integrity of self patch- Negative for gastric or duodenal                  extravasation.  Numerous duodenal diverticula.  -  started clears 02/05/18  - original CT 11/27, WBC: 10.8>>11.6>>20 0.1>>19.1 - today  FEN:  IV fluids/NPO =>>clears 12/2  ID:  Cipro 11/28 -12/2; Azactam 12/2 =>> day 2/Flagyl  11/27 =>> day 6 DVT: none SCD's ordered Follow up:  TBD   Plan: From our standpoint okay to leave him on clears, asked nutrition to see and help with supplements.  We need to get him out of bed and try to get him to do some incentive spirometry.  I am concerned about his leukocytosis.  On exam he does not appear to have any abdominal discomfort/abdominal abscess, some of this elevated leukocytosis could be pulmonary, but we cannot be sure.  If his white count remains elevated we may need to repeat a CT.  His fluids are about 100 ml/hr currently;  will defer to Dr.XU on this.   LOS: 6 days    Huck Ashworth 02/06/2018 [phone]

## 2018-02-06 NOTE — Progress Notes (Signed)
Progress Note  Patient Name: Manuel Reilly Date of Encounter: 02/06/2018  Primary Cardiologist: Donato Schultz, MD   Subjective   Pt appears tired   Sitting in chair  Comfortable   Inpatient Medications    Scheduled Meds: . mouth rinse  15 mL Mouth Rinse BID  . pantoprazole  40 mg Intravenous Q12H  . sucralfate  1 g Oral Q6H   Continuous Infusions: . amiodarone 30 mg/hr (02/06/18 1033)  . aztreonam Stopped (02/06/18 0555)  . diltiazem (CARDIZEM) infusion 10 mg/hr (02/06/18 0754)  . metronidazole Stopped (02/06/18 0853)   PRN Meds: acetaminophen **OR** acetaminophen, albuterol, albuterol, ALPRAZolam, ipratropium-albuterol, morphine injection, ondansetron (ZOFRAN) IV   Vital Signs    Vitals:   02/06/18 0600 02/06/18 0700 02/06/18 0800 02/06/18 1000  BP: (!) 107/54 (!) 117/53 (!) 126/55 (!) 121/54  Pulse: (!) 114 (!) 116 (!) 114 (!) 113  Resp: 19 14 19 20   Temp:   (!) 97.4 F (36.3 C)   TempSrc:   Oral   SpO2: 90% (!) 89% 93% 91%  Weight:      Height:        Intake/Output Summary (Last 24 hours) at 02/06/2018 1119 Last data filed at 02/06/2018 1100 Gross per 24 hour  Intake 4484.02 ml  Output 810 ml  Net 3674.02 ml    Net  +  10 L  Filed Weights   02/01/18 0201  Weight: 75.8 kg    Telemetry      Probable atrial flutter   110 bpm   Personally Reviewed  ECG    Possible atypcial atrial flutter   With 2:1 AV conduction  RBBB- Personally Reviewed  Physical Exam   GEN:  Awake  Sitting in chair   Neck: JVP is inncreased Cardiac:  RRR  tachycardic, no murmurs, rubs, or gallops.  Respiratory: Clear to auscultation bilaterally. GI: Sl distended   + BS   MS: 1+  Edema UE   Tr LE edema   No deformity. Neuro:  Nonfocal    Labs    Chemistry Recent Labs  Lab 01/31/18 1708 02/01/18 0754 02/02/18 0324  02/04/18 0316 02/05/18 0356 02/06/18 0258  NA 133* 135 134*   < > 134* 137 139  K 3.6 3.6 3.6   < > 3.5 3.9 4.3  CL 98 104 105   < > 110 110 112*    CO2 21* 23 20*   < > 17* 20* 16*  GLUCOSE 73 66* 116*   < > 125* 124* 119*  BUN 38* 42* 32*   < > 29* 31* 35*  CREATININE 1.20 1.21 0.89   < > 0.90 0.78 0.94  CALCIUM 8.3* 7.8* 7.7*   < > 7.7* 8.0* 7.8*  PROT 6.2* 5.6* 5.0*  --   --   --   --   ALBUMIN 3.4* 3.2* 2.6*  --   --   --   --   AST 70* 46* 27  --   --   --   --   ALT 90* 64* 44  --   --   --   --   ALKPHOS 102 84 83  --   --   --   --   BILITOT 1.1 0.9 1.1  --   --   --   --   GFRNONAA 52* 52* >60   < > >60 >60 >60  GFRAA >60 60* >60   < > >60 >60 >60  ANIONGAP 14 8 9    < >  7 7 11    < > = values in this interval not displayed.     Hematology Recent Labs  Lab 02/04/18 0316 02/05/18 0356 02/06/18 0258  WBC 11.6* 20.1* 19.1*  RBC 4.29 4.90 4.58  HGB 12.8* 14.5 13.6  HCT 39.0 44.3 42.0  MCV 90.9 90.4 91.7  MCH 29.8 29.6 29.7  MCHC 32.8 32.7 32.4  RDW 15.0 15.8* 16.3*  PLT 105* 136* 161    Cardiac Enzymes Recent Labs  Lab 01/31/18 1708 02/01/18 0132 02/01/18 0511 02/01/18 1508  TROPONINI 0.09* 0.09* 0.09* 0.14*   No results for input(s): TROPIPOC in the last 168 hours.   BNPNo results for input(s): BNP, PROBNP in the last 168 hours.   DDimer No results for input(s): DDIMER in the last 168 hours.   Radiology    Ct Head Wo Contrast  Result Date: 02/04/2018 CLINICAL DATA:  Disorientation. EXAM: CT HEAD WITHOUT CONTRAST TECHNIQUE: Contiguous axial images were obtained from the base of the skull through the vertex without intravenous contrast. COMPARISON:  None. FINDINGS: Brain: There is no evidence of acute infarct, intracranial hemorrhage, mass, midline shift, or extra-axial fluid collection. There is a chronic lacunar infarct in the left caudate nucleus. Scattered cerebral white matter hypodensities are nonspecific but compatible with mild chronic small vessel ischemic disease. Mild cerebral atrophy is not greater than expected for age. Vascular: Calcified atherosclerosis at the skull base. No hyperdense  vessel. Skull: No fracture or suspicious osseous lesion. Sinuses/Orbits: No acute findings. Bilateral cataract extraction. Other: None. IMPRESSION: 1. No evidence of acute intracranial abnormality. 2. Mild chronic small vessel ischemic disease. Electronically Signed   By: Sebastian AcheAllen  Grady M.D.   On: 02/04/2018 15:31   Dg Chest Port 1 View  Result Date: 02/05/2018 CLINICAL DATA:  Hypoxia.  Right pleural effusion.  Emphysema. EXAM: PORTABLE CHEST 1 VIEW COMPARISON:  01/31/2018 FINDINGS: Heart size remains normal. Today's film is lordotically positioned. Question increased density in the lower lobes bilaterally which could go along with some worsened atelectasis. Background scarring and emphysematous change as seen previously. IMPRESSION: Question worsened atelectasis in both lower lungs. Lordotically position film. Electronically Signed   By: Paulina FusiMark  Shogry M.D.   On: 02/05/2018 09:50   Dg Kayleen MemosUgi  W/kub  Result Date: 02/05/2018 CLINICAL DATA:  Perforated duodenal ulcer, evaluate for sealing EXAM: WATER SOLUBLE UPPER GI SERIES TECHNIQUE: Single-column upper GI series was performed using water soluble contrast. CONTRAST:  Isotonic water-soluble contrast COMPARISON:  Abdominal CT 01/31/2018 FLUOROSCOPY TIME:  Fluoroscopy Time:  3 minutes 6 seconds Radiation Exposure Index (if provided by the fluoroscopic device): 28.1 mGy Number of Acquired Spot Images: 3 FINDINGS: KUB shows a normal bowel gas pattern. The patient had difficulty swallowing recumbent such that took a few swallows via straw sitting up and then was returned recumbent for imaging. The contrast accumulated within the fundus, requiring right lateral decubitus imaging to advance into the antrum. The stomach was moderately distended by gas. Slow passage of contrast through the pylorus, likely due to edema, with no extravasation seen. Duodenal diverticula are present. A discrete ulcer was not identified. Gastroesophageal reflux incidentally noted. IMPRESSION: 1.  Negative for gastric or duodenal extravasation. 2. Numerous duodenal diverticula. Electronically Signed   By: Marnee SpringJonathon  Watts M.D.   On: 02/05/2018 11:22    Cardiac Studies   Echo unable to visualize  Patient Profile     84 y.o. male with new onset persistent atrial fibrillation in the setting of perforated peptic ulcer with mildly elevated troponin, right  bundle branch block left anterior fascicular block now with delirium  Assessment & Plan    Rhythm.    Pt had atrial fibrillation earlier   Now with probable atypical atrial flutter  ON IV diltiazem and IV amiodarone    Rates still high I would increase amiodarone to 60 mg / hour for 6 hours      Not a candidate for anticoagulation at present   Perforated peptic ulcer Surgery following   He is 10 L positive   Received IV lasix yesterday   He has received additional lasix today    Elevated troponin -Demand ischemia low level flat.  DNR.    For questions or updates, please contact [facility] Please consult [email or web address] for contact info under        Signed, Dietrich Pates, MD  02/06/2018, 11:19 AM

## 2018-02-06 NOTE — Progress Notes (Signed)
  Speech Language Pathology Treatment: Dysphagia  Patient Details Name: Manuel Reilly H Boldon MRN: [id number] DOB: 03/18/1923 Today's Date: 02/06/2018 Time: [id number] SLP Time Calculation (min) (ACUTE ONLY): 21 min  Assessment / Plan / Recommendation Clinical Impression  SLP informed daughter to results of MBS using video of pt swallow and normal swallow for comparison, advised to concerns for nutrition and aspiration risk and importance of aspiration precautions.  Manuel Reilly inquired if swallow would improve - to which SLP advised uncertainty especially given pt advanced age.    Note pt for CT of abdomen tomorrow with contrast *IV and po* and SLP phoned CT inquiring if concentrated contrast available.   Pt only needs to consume 2 "water bottle size" bottles of contrast at least 2 hours before CT abdomen.  [facility] Dr Roda ShuttersXu regarding information received from CT technologist Megan.    Recommend continue clears via tsp only with multiple swallows across each bolus.  Will follow up.     HPI HPI: pt is [facility] 84 [facility] with [facility]- found to have perforated peptic ulcer.   Today pt with increased ATX at lung bases, WBC elevation.  Swallow evaluation ordered.      SLP Plan  Continue with current plan of care       Recommendations  Diet recommendations: Other(comment);Thin liquid(clears) Liquids provided via: Teaspoon Medication Administration: (? crushed with liquids) Compensations: Multiple dry swallows after each bite/sip Postural Changes and/or Swallow Maneuvers: Upright 30-60 min after meal;Seated upright 90 degrees                Oral Care Recommendations: Oral care QID Follow up Recommendations: None SLP Visit Diagnosis: Dysphagia, oropharyngeal phase (R13.12) Plan: Continue with current plan of care       GO                Manuel Reilly, Manuel Reilly 02/06/2018, 3:45 PM  Donavan Burnetamara Manuel Truax, MS [facility] SLP Acute Rehab Services Pager [phone] Office  [phone]

## 2018-02-06 NOTE — Progress Notes (Signed)
PT Cancellation Note  Patient Details Name: Manuel Reilly H Ackers MRN: [id number] DOB: September 10, 1923   Cancelled Treatment:    Reason Eval/Treat Not Completed: Medical issues which prohibited therapy ,will evaluate when rate controlled and off IV meds per PT guidelines for mobility.   Rada HayHill, Kaoru Benda Elizabeth 02/06/2018, 9:23 AM Blanchard KelchKaren Shondrea Steinert PT Acute Rehabilitation Services Pager [phone] Office [phone]

## 2018-02-06 NOTE — Progress Notes (Addendum)
PROGRESS NOTE  Roque CashHoward H Stalvey ZOX:[id number] DOB: March 02, 1924 DOA: 01/31/2018 PCP: Myrlene Brokerrawford, Elizabeth A, MD  Brief Summary:  Previously independent  Patient presents with ab pain, found to have perforated peptic ulcer and new onset of afib    HPI/Recap of past 24 hours:   He is started on clear liquid, BMx2  patient denies ab pain,  No vomiting,  no fever, though wbc going up  Urine output is poor,   Remain in afib on cardizem drip and amiodarone drip, bp is better  He is very weak and lethargic today  Assessment/Plan: Principal Problem:   Perforated peptic ulcer (HCC) Active Problems:   COPD (chronic obstructive pulmonary disease) (HCC)   Elevated troponin   Pleural effusion   HTN (hypertension)  Perforated peptic ulcer/ileus  -General surgery consulted, conservative management with NG suction/ PPI/abx/ivf ,he Pulled out ng the night on 11/28-1/29 -Keep k >4, keep mag>2 --Upper GI series on 12/2  "1. Negative for gastric or duodenal extravasation. 2. Numerous duodenal diverticula" -wbc going up, general surgery to decide on repeat CT ab with oral and iv contrast, appreciate general surgery input   ( was on cipro/flagyl, due to concerns about Qtc prolongation after starting on amiodarone, abx changed to aztreonam/flagyl, may need to add on van if spike fever, so far no fever, but does has leukocytosis)  Hypokalemia: replace k, keep > 4  Metabolic acidosis iv bicarb due to limited oral intake/npo  New onset of A. Fib -Not a candidate for anticoagulation due to perforated peptic ulcer -Rate control with cardizem drip/amiodarone drip -Echocardiogram poor quality -cardiology input appreciated, will follow cards recommendation  Volume overload, bilateral lower extremity edema -prn iv lasix, monitor volume status   Leukocytosis: No fever, cxr with possible atelectasis, blood culture no growth  Thrombocytopenia: From acute illness? /consumption?/abx? plt  nadir at 105, plt normalized today 161. Monitor  AKI: Cr 1.2 on presentation Cr came down to 0.78 ua no infection  Elevated LFT: normalized  Delirium: Has had confusion at night, required prn ativan CT head no acute findings  Daughter states patient lives by himself, still drives and go shopping himself prior to this  Code Status: DNR  Family Communication: patient in the room, daughter at bedside  Disposition Plan: remain in stepdown   Consultants:  General surgery  Critical care  cardiology  Procedures:  none  Antibiotics:  As above   Objective: BP (!) 117/53   Pulse (!) 116   Temp (!) 97.4 F (36.3 C) (Oral)   Resp 14   Ht 5\' 9"  (1.753 m)   Wt 75.8 kg   SpO2 (!) 89%   BMI 24.66 kg/m   Intake/Output Summary (Last 24 hours) at 02/06/2018 0736 Last data filed at 02/06/2018 [id number] Gross per 24 hour  Intake 4444.68 ml  Output 750 ml  Net 3694.68 ml   Filed Weights   02/01/18 0201  Weight: 75.8 kg    Exam: Patient is examined daily including today on 02/06/2018, exams remain the same as of yesterday except that has changed    General:  Weak and tired, lethargic   Cardiovascular: IRRR  Respiratory: diminished at basis, no wheezing, no rales  Abdomen:  distended,  Hypoactive bowel sounds  Musculoskeletal: bilateral lower extremity pitting edema  Neuro:  Weak and tired, lethargic  Data Reviewed: Basic Metabolic Panel: Recent Labs  Lab 02/01/18 0754 02/02/18 0324 02/03/18 0320 02/04/18 0316 02/05/18 0356 02/06/18 0258  NA 135 134* 134* 134* 137 139  K 3.6 3.6 4.0 3.5 3.9 4.3  CL 104 105 109 110 110 112*  CO2 23 20* 21* 17* 20* 16*  GLUCOSE 66* 116* 107* 125* 124* 119*  BUN 42* 32* 28* 29* 31* 35*  CREATININE 1.21 0.89 0.75 0.90 0.78 0.94  CALCIUM 7.8* 7.7* 7.8* 7.7* 8.0* 7.8*  MG 2.0  --  2.1 2.1  --  2.1   Liver Function Tests: Recent Labs  Lab 01/31/18 1708 02/01/18 0754 02/02/18 0324  AST 70* 46* 27  ALT 90* 64* 44    ALKPHOS 102 84 83  BILITOT 1.1 0.9 1.1  PROT 6.2* 5.6* 5.0*  ALBUMIN 3.4* 3.2* 2.6*   Recent Labs  Lab 01/31/18 1708  LIPASE 59*   No results for input(s): AMMONIA in the last 168 hours. CBC: Recent Labs  Lab 01/31/18 1708  02/02/18 0324 02/03/18 0320 02/04/18 0316 02/05/18 0356 02/06/18 0258  WBC 9.9   < > 9.0 10.8* 11.6* 20.1* 19.1*  NEUTROABS 9.2*  --  8.0*  --  9.9* 17.3*  --   HGB 16.7   < > 13.6 13.0 12.8* 14.5 13.6  HCT 50.7   < > 41.8 40.0 39.0 44.3 42.0  MCV 90.7   < > 93.9 94.3 90.9 90.4 91.7  PLT 180   < > 127* 106* 105* 136* 161   < > = values in this interval not displayed.   Cardiac Enzymes:   Recent Labs  Lab 01/31/18 1708 02/01/18 0132 02/01/18 0511 02/01/18 1508  TROPONINI 0.09* 0.09* 0.09* 0.14*   BNP (last 3 results) No results for input(s): BNP in the last 8760 hours.  ProBNP (last 3 results) No results for input(s): PROBNP in the last 8760 hours.  CBG: No results for input(s): GLUCAP in the last 168 hours.  Recent Results (from the past 240 hour(s))  MRSA PCR Screening     Status: None   Collection Time: 02/01/18  3:00 PM  Result Value Ref Range Status   MRSA by PCR NEGATIVE NEGATIVE Final    Comment:        The GeneXpert MRSA Assay (FDA approved for NASAL specimens only), is one component of a comprehensive MRSA colonization surveillance program. It is not intended to diagnose MRSA infection nor to guide or monitor treatment for MRSA infections. Performed at [facility], [address], [place], [place] [address]      Studies: Dg Chest Port 1 View  Result Date: 02/05/2018 CLINICAL DATA:  Hypoxia.  Right pleural effusion.  Emphysema. EXAM: PORTABLE CHEST 1 VIEW COMPARISON:  01/31/2018 FINDINGS: Heart size remains normal. Today's film is lordotically positioned. Question increased density in the lower lobes bilaterally which could go along with some worsened atelectasis. Background scarring and emphysematous  change as seen previously. IMPRESSION: Question worsened atelectasis in both lower lungs. Lordotically position film. Electronically Signed   By: Paulina Fusi M.D.   On: 02/05/2018 09:50   Dg Kayleen Memos  W/kub  Result Date: 02/05/2018 CLINICAL DATA:  Perforated duodenal ulcer, evaluate for sealing EXAM: WATER SOLUBLE UPPER GI SERIES TECHNIQUE: Single-column upper GI series was performed using water soluble contrast. CONTRAST:  Isotonic water-soluble contrast COMPARISON:  Abdominal CT 01/31/2018 FLUOROSCOPY TIME:  Fluoroscopy Time:  3 minutes 6 seconds Radiation Exposure Index (if provided by the fluoroscopic device): 28.1 mGy Number of Acquired Spot Images: 3 FINDINGS: KUB shows a normal bowel gas pattern. The patient had difficulty swallowing recumbent such that took a few swallows via straw sitting up and  then was returned recumbent for imaging. The contrast accumulated within the fundus, requiring right lateral decubitus imaging to advance into the antrum. The stomach was moderately distended by gas. Slow passage of contrast through the pylorus, likely due to edema, with no extravasation seen. Duodenal diverticula are present. A discrete ulcer was not identified. Gastroesophageal reflux incidentally noted. IMPRESSION: 1. Negative for gastric or duodenal extravasation. 2. Numerous duodenal diverticula. Electronically Signed   By: Marnee Spring M.D.   On: 02/05/2018 11:22    Scheduled Meds: . mouth rinse  15 mL Mouth Rinse BID  . pantoprazole  40 mg Intravenous Q12H  . sucralfate  1 g Oral Q6H    Continuous Infusions: . sodium chloride 70 mL/hr at 02/06/18 0400  . amiodarone 30 mg/hr (02/06/18 0400)  . aztreonam 1 g (02/06/18 0525)  . diltiazem (CARDIZEM) infusion 10 mg/hr (02/06/18 0400)  . metronidazole Stopped (02/05/18 2343)     Time spent: I have personally reviewed and interpreted on  02/06/2018 daily labs, tele strips, imagings as discussed above under date review session and  assessment and plans.  I reviewed all nursing notes, pharmacy notes, consultant notes,  vitals, pertinent old records  I have discussed plan of care as described above with RN , patient and family on 02/06/2018   Albertine Grates MD, PhD  [facility] Pager [phone]. If 7PM-7AM, please contact night-coverage at [email or web address], password [facility] 02/06/2018, 7:36 AM  LOS: 6 days

## 2018-02-06 NOTE — Progress Notes (Signed)
OT Cancellation Note  Patient Details Name: Manuel Reilly MRN: [id number] DOB: 1924/01/17   Cancelled Treatment:    Reason Eval/Treat Not Completed: Other (comment).  Pt up in chair but getting ready for swallow study.  Will likely check back tomorrow.  [name] 02/06/2018, 1:09 PM  [name], OTR/L Acute Rehabilitation Services [phone] [facility] pager [phone] office 02/06/2018

## 2018-02-06 NOTE — Progress Notes (Signed)
Modified Barium Swallow Progress Note  Patient Details  Name: Manuel Reilly MRN: [id number] Date of Birth: 02/06/1924  Today's Date: 02/06/2018  Modified Barium Swallow completed.  Full report located under Chart Review in the Imaging Section.  Brief recommendations include the following:  Clinical Impression  Patient presents with moderately severe oropharyngeal dysphagia mostly characterized by weakness.  Pt as only given approximately 9 boluses of barium - thin, nectar and pudding.  Poor oral transiting/bolus cohesion skills due to weakness noted which results in premature spillage and oral residuals.  Pt also extended head upward to aid oral transiting with liquids of increased viscocity.  In addition, pharyngeal swallow is weak resulting in residuals that patient intermittently senses.  Multiple swallows with all consistencies assist to decrease residuals but did not full eliminate them - thus increasing pt's aspiration risk.  Although he did NOT aspirate or penetrate, certain he does experience aspiration due to this level of weakness and residuals.  At this time, would recommend to continue clear liquids via tsp only - with pt swallowing multiple times with each tsp.  NO CUPS/STRAWS nor thicker consistencies.  Pt educated to findings/recommendation but uncertain to his level of comprehension.  Thankful pt is for palliative consult as concerned for his ability to meet nutritional needs with level of dysphagia.     Swallow Evaluation Recommendations       SLP Diet Recommendations: Thin liquid(tsps of clears only)   Liquid Administration via: No straw;Spoon(tsp only!!!!  )   Medication Administration: Other (Comment)(crushed with liquids only)       Compensations: Small sips/bites;Slow rate;Multiple dry swallows after each bite/sip   Postural Changes: Remain semi-upright after after feeds/meals (Comment);Seated upright at 90 degrees   Oral Care Recommendations: Oral care QID   Other Recommendations: Have oral suction available  [name], MS [facility] SLP Acute Rehab Services Pager [phone] Office [phone]  [name], [name] 02/06/2018,3:00 PM

## 2018-02-07 ENCOUNTER — Inpatient Hospital Stay (HOSPITAL_COMMUNITY): Payer: Medicare Other

## 2018-02-07 ENCOUNTER — Encounter (HOSPITAL_COMMUNITY): Payer: Self-pay | Admitting: Radiology

## 2018-02-07 DIAGNOSIS — Z515 Encounter for palliative care: Secondary | ICD-10-CM

## 2018-02-07 DIAGNOSIS — D72829 Elevated white blood cell count, unspecified: Secondary | ICD-10-CM

## 2018-02-07 DIAGNOSIS — M6258 Muscle wasting and atrophy, not elsewhere classified, other site: Secondary | ICD-10-CM

## 2018-02-07 DIAGNOSIS — Z91038 Other insect allergy status: Secondary | ICD-10-CM

## 2018-02-07 DIAGNOSIS — R1084 Generalized abdominal pain: Secondary | ICD-10-CM

## 2018-02-07 DIAGNOSIS — I81 Portal vein thrombosis: Secondary | ICD-10-CM

## 2018-02-07 DIAGNOSIS — K0889 Other specified disorders of teeth and supporting structures: Secondary | ICD-10-CM

## 2018-02-07 DIAGNOSIS — J90 Pleural effusion, not elsewhere classified: Secondary | ICD-10-CM

## 2018-02-07 DIAGNOSIS — R58 Hemorrhage, not elsewhere classified: Secondary | ICD-10-CM

## 2018-02-07 DIAGNOSIS — Z884 Allergy status to anesthetic agent status: Secondary | ICD-10-CM

## 2018-02-07 DIAGNOSIS — J439 Emphysema, unspecified: Secondary | ICD-10-CM

## 2018-02-07 DIAGNOSIS — R140 Abdominal distension (gaseous): Secondary | ICD-10-CM

## 2018-02-07 DIAGNOSIS — I10 Essential (primary) hypertension: Secondary | ICD-10-CM

## 2018-02-07 DIAGNOSIS — Z880 Allergy status to penicillin: Secondary | ICD-10-CM

## 2018-02-07 DIAGNOSIS — K5790 Diverticulosis of intestine, part unspecified, without perforation or abscess without bleeding: Secondary | ICD-10-CM

## 2018-02-07 LAB — CBC WITH DIFFERENTIAL/PLATELET
Abs Immature Granulocytes: 0.45 10*3/uL — ABNORMAL HIGH (ref 0.00–0.07)
Basophils Absolute: 0.1 10*3/uL (ref 0.0–0.1)
Basophils Relative: 0 %
Eosinophils Absolute: 0.1 10*3/uL (ref 0.0–0.5)
Eosinophils Relative: 0 %
HCT: 42.7 % (ref 39.0–52.0)
Hemoglobin: 14.5 g/dL (ref 13.0–17.0)
Immature Granulocytes: 2 %
LYMPHS PCT: 5 %
Lymphs Abs: 1 10*3/uL (ref 0.7–4.0)
MCH: 29.5 pg (ref 26.0–34.0)
MCHC: 34 g/dL (ref 30.0–36.0)
MCV: 87 fL (ref 80.0–100.0)
Monocytes Absolute: 1.2 10*3/uL — ABNORMAL HIGH (ref 0.1–1.0)
Monocytes Relative: 6 %
Neutro Abs: 16.8 10*3/uL — ABNORMAL HIGH (ref 1.7–7.7)
Neutrophils Relative %: 87 %
Platelets: 171 10*3/uL (ref 150–400)
RBC: 4.91 MIL/uL (ref 4.22–5.81)
RDW: 16.1 % — ABNORMAL HIGH (ref 11.5–15.5)
WBC: 19.6 10*3/uL — AB (ref 4.0–10.5)
nRBC: 0.1 % (ref 0.0–0.2)

## 2018-02-07 LAB — MAGNESIUM: Magnesium: 1.6 mg/dL — ABNORMAL LOW (ref 1.7–2.4)

## 2018-02-07 LAB — BASIC METABOLIC PANEL
Anion gap: 5 (ref 5–15)
BUN: 41 mg/dL — ABNORMAL HIGH (ref 8–23)
CALCIUM: 6.5 mg/dL — AB (ref 8.9–10.3)
CO2: 21 mmol/L — ABNORMAL LOW (ref 22–32)
Chloride: 116 mmol/L — ABNORMAL HIGH (ref 98–111)
Creatinine, Ser: 1.05 mg/dL (ref 0.61–1.24)
GFR calc Af Amer: >60 mL/min (ref >60)
GFR calc non Af Amer: >60 mL/min (ref >60)
Glucose, Bld: 136 mg/dL — ABNORMAL HIGH (ref 70–99)
Potassium: 3.2 mmol/L — ABNORMAL LOW (ref 3.5–5.1)
Sodium: 142 mmol/L (ref 135–145)

## 2018-02-07 LAB — LACTIC ACID, PLASMA
LACTIC ACID, VENOUS: 2.6 mmol/L — AB (ref 0.5–1.9)
Lactic Acid, Venous: 3.2 mmol/L (ref 0.5–1.9)
Lactic Acid, Venous: 3.6 mmol/L (ref 0.5–1.9)
Lactic Acid, Venous: 2.6 mmol/L (ref 0.5–1.9)
Lactic Acid, Venous: 2.3 mmol/L (ref 0.5–1.9)

## 2018-02-07 LAB — GLUCOSE, CAPILLARY: Glucose-Capillary: 122 mg/dL — ABNORMAL HIGH (ref 70–99)

## 2018-02-07 MED ORDER — SODIUM CHLORIDE 0.9 % IV BOLUS
500.0000 mL | Freq: Once | INTRAVENOUS | Status: AC
  Administered 2018-02-07: 500 mL via INTRAVENOUS

## 2018-02-07 MED ORDER — SODIUM CHLORIDE (PF) 0.9 % IJ SOLN
INTRAMUSCULAR | Status: AC
  Filled 2018-02-07: qty 50

## 2018-02-07 MED ORDER — HEPARIN (PORCINE) 25000 UT/250ML-% IV SOLN
1300.0000 [IU]/h | INTRAVENOUS | Status: DC
  Administered 2018-02-07: 1100 [IU]/h via INTRAVENOUS
  Filled 2018-02-07: qty 250

## 2018-02-07 MED ORDER — BOOST / RESOURCE BREEZE PO LIQD CUSTOM
1.0000 | Freq: Three times a day (TID) | ORAL | Status: DC
  Administered 2018-02-07 – 2018-02-09 (×6): 1 via ORAL

## 2018-02-07 MED ORDER — POTASSIUM CHLORIDE 10 MEQ/100ML IV SOLN
10.0000 meq | INTRAVENOUS | Status: AC
  Administered 2018-02-07 (×4): 10 meq via INTRAVENOUS
  Filled 2018-02-07 (×4): qty 100

## 2018-02-07 MED ORDER — FUROSEMIDE 10 MG/ML IJ SOLN
80.0000 mg | Freq: Once | INTRAMUSCULAR | Status: AC
  Administered 2018-02-07: 80 mg via INTRAVENOUS
  Filled 2018-02-07: qty 8

## 2018-02-07 MED ORDER — SODIUM CHLORIDE 0.9 % IV BOLUS: 250.0000 mL | Freq: Once | INTRAVENOUS | Status: DC

## 2018-02-07 MED ORDER — MAGNESIUM SULFATE 2 GM/50ML IV SOLN
2.0000 g | Freq: Once | INTRAVENOUS | Status: AC
  Administered 2018-02-07: 2 g via INTRAVENOUS
  Filled 2018-02-07: qty 50

## 2018-02-07 MED ORDER — IOHEXOL 300 MG/ML  SOLN: 15.0000 mL | Freq: Once | INTRAMUSCULAR | Status: DC | PRN

## 2018-02-07 MED ORDER — SODIUM CHLORIDE 0.9 % IV SOLN
1.0000 g | Freq: Two times a day (BID) | INTRAVENOUS | Status: DC
  Administered 2018-02-07 – 2018-02-10 (×6): 1 g via INTRAVENOUS
  Filled 2018-02-07 (×7): qty 1

## 2018-02-07 MED ORDER — IOHEXOL 300 MG/ML  SOLN
100.0000 mL | Freq: Once | INTRAMUSCULAR | Status: AC | PRN
  Administered 2018-02-07: 100 mL via INTRAVENOUS

## 2018-02-07 NOTE — Evaluation (Signed)
Physical Therapy Evaluation Patient Details Name: Manuel Reilly MRN: [id number] DOB: Mar 24, 1923 Today's Date: 02/07/2018   History of Present Illness  THis 84 year old man was admitted with abdominal pain and found to have perforated peptic ulcer.  Clinical Impression  The patient is very weak, arms weeping significantly. Patient required 2 max/total assist to mobilize, did stand x 1 briefly. . Pt admitted with above diagnosis. Pt currently with functional limitations due to the deficits listed below (see PT Problem List).  Pt will benefit from skilled PT to increase their independence and safety with mobility to allow discharge to the venue listed below.       Follow Up Recommendations SNF    Equipment Recommendations  None recommended by PT    Recommendations for Other Services       Precautions / Restrictions Precautions Precautions: Fall Precaution Comments: oozing fluid from arms, monitor VS, on a drip but RN ok'd mobility, both hands edematous and painful Restrictions Weight Bearing Restrictions: No      Mobility  Bed Mobility Overal bed mobility: Needs Assistance Bed Mobility: Rolling           General bed mobility comments: total +2 assist for all bed mobility. Pt 10%  Transfers Overall transfer level: Needs assistance   Transfers: Sit to/from Stand Sit to Stand: Max assist;+2 physical assistance;From elevated surface         General transfer comment: pt with posterior lean. Assist to rise and stabilize with hand held assist of 2  Ambulation/Gait                Stairs            Wheelchair Mobility    Modified Rankin (Stroke Patients Only)       Balance Overall balance assessment: Needs assistance   Sitting balance-Leahy Scale: Fair     Standing balance support: Bilateral upper extremity supported Standing balance-Leahy Scale: Poor                               Pertinent Vitals/Pain Faces Pain Scale: Hurts  even more Pain Location: all 4 extremities, especially both hands Pain Descriptors / Indicators: Grimacing;Moaning Pain Intervention(s): Monitored during session;Limited activity within patient's tolerance;Repositioned    Home Living Family/patient expects to be discharged to:: Unsure Living Arrangements: Alone                    Prior Function Level of Independence: Independent         Comments: drove     Hand Dominance   Dominant Hand: Right    Extremity/Trunk Assessment   Upper Extremity Assessment Upper Extremity Assessment: Defer to OT evaluation;RUE deficits/detail;LUE deficits/detail RUE Deficits / Details: marked redness and edema  of the forearm and hand, very painful to attempt ROM, weeping moderately LUE Deficits / Details: similar right, more painful with fingers.    Lower Extremity Assessment Lower Extremity Assessment: Generalized weakness    Cervical / Trunk Assessment Cervical / Trunk Assessment: Kyphotic  Communication   Communication: (difficult to undestand)  Cognition Arousal/Alertness: Awake/alert Behavior During Therapy: WFL for tasks assessed/performed Overall Cognitive Status: Impaired/Different from baseline Area of Impairment: Orientation                 Orientation Level: Time;Situation             General Comments: delayed processing at times, patient did attempt to sat [place]  after cued      General Comments General comments (skin integrity, edema, etc.): positioned bil UEs for edema management and encouraged moving fingers.  R hand with more movement than L    Exercises     Assessment/Plan    PT Assessment Patient needs continued PT services  PT Problem List Decreased strength;Decreased cognition;Decreased range of motion;Decreased knowledge of use of DME;Decreased activity tolerance;Decreased safety awareness;Decreased skin integrity;Decreased balance;Decreased knowledge of precautions;Decreased  mobility;Cardiopulmonary status limiting activity       PT Treatment Interventions DME instruction;Therapeutic exercise;Gait training;Functional mobility training;Cognitive remediation;Therapeutic activities;Patient/family education    PT Goals (Current goals can be found in the Care Plan section)  Acute Rehab PT Goals Patient Stated Goal: none stated; wanted to get off back PT Goal Formulation: With patient/family Time For Goal Achievement: 02/21/18 Potential to Achieve Goals: Fair    Frequency Min 2X/week   Barriers to discharge Decreased caregiver support      Co-evaluation PT/OT/SLP Co-Evaluation/Treatment: Yes Reason for Co-Treatment: For patient/therapist safety PT goals addressed during session: Mobility/safety with mobility OT goals addressed during session: ADL's and self-care       AM-PAC PT "6 Clicks" Mobility  Outcome Measure Help needed turning from your back to your side while in a flat bed without using bedrails?: Total Help needed moving from lying on your back to sitting on the side of a flat bed without using bedrails?: Total Help needed moving to and from a bed to a chair (including a wheelchair)?: Total Help needed standing up from a chair using your arms (e.g., wheelchair or bedside chair)?: Total Help needed to walk in hospital room?: Total Help needed climbing 3-5 steps with a railing? : Total 6 Click Score: 6    End of Session Equipment Utilized During Treatment: Gait belt Activity Tolerance: Patient limited by fatigue Patient left: in bed;with call bell/phone within reach;with bed alarm set;with family/visitor present Nurse Communication: Mobility status PT Visit Diagnosis: Unsteadiness on feet (R26.81)    Time: [id number] PT Time Calculation (min) (ACUTE ONLY): 30 min   Charges:   PT Evaluation $PT Eval Moderate Complexity: 1 Mod          Blanchard KelchKaren Josalynn Johndrow PT Acute Rehabilitation Services Pager [phone] Office [phone]   Rada HayHill,  Nansi Birmingham Elizabeth 02/07/2018, 1:17 PM

## 2018-02-07 NOTE — Progress Notes (Signed)
SLP Cancellation Note  Patient Details Name: Manuel Reilly MRN: [id number] DOB: August 29, 1923   Cancelled treatment:       Reason Eval/Treat Not Completed: Other (comment)(pt for CT of his abdomen today, will continue efforts)   [name], Shekelia Boutin Ann 02/07/2018, 5:31 PM  Donavan Burnetamara Rashard Ryle, MS [facility] SLP Acute Rehab Services Pager [phone] Office [phone]

## 2018-02-07 NOTE — Consult Note (Signed)
Consultation Note Date: 02/07/2018   Patient Name: Manuel Reilly  DOB: Mar 01, 1924  MRN: [id number]  Age / Sex: 84 y.o., male  PCP: Hoyt Koch, MD Referring Physician: Elmarie Shiley, MD  Reason for Consultation: Establishing goals of care  HPI/Patient Profile: 84 y.o. male  with past medical history of anxiety and sciatica who was admitted on 01/31/2018 with abdominal pain.  He was found to have a perforated peptic ulcer and new onset atrial fibrillation.  His most recent CT scan (12/4) does not show free air, but does show portal vein thrombosis.  SLP evaluation indicates moderately severe oral dysphagia.  Lactic acid drawn earlier today was 3.6.   Clinical Assessment and Goals of Care:   I have reviewed medical records including EPIC notes, labs and imaging, received report from the care team, assessed the patient and then met at the bedside along with his daughter Jocelyn Lamer  to discuss diagnosis prognosis, [place], EOL wishes, disposition and options.  I introduced Palliative Medicine as specialized medical care for people living with serious illness. It focuses on providing relief from the symptoms and stress of a serious illness. The goal is to improve quality of life for both the patient and the family.  We discussed a brief life review of the patient.  He served in [employer] in [employer].  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  He is [place].  He as 3 daughters and multiple grandsons.  He lives independently with support from [place] for meals and getting the bills sorted and paid.   He lost his wife 9 years ago to complications from DM.  She died in [facility].  Jocelyn Lamer mentioned that her father still drives.  He has always been very healthy.  He did not want to come to the hospital.  When his wife was very ill he made  the decision against feeding tube and for comfort care.   She tells me that this week - in 1 breath he will say "oh I wish the Reita Cliche would just take me" and in the next breath he will say "I want to drive my car".    Jocelyn Lamer understands that her father is very ill and things could easily go down hill quickly.  We discussed his elevated lactic acid.   She expressed concern about his nutritional status.  We talked about his difficulty swallowing and the possibility of aspiration.  We discussed careful hand feeding as the most appropriate method of feeding - preferable to a feeding tube.  We also talked about possible discharge options.  Per Jocelyn Lamer her sister Malachy Mood has offered to move in with her father and care for him rather than send him to SNF.  Particularly if he can stand and walk a few steps.  We talked about Hospice services in the home on discharge.  Jocelyn Lamer wanted to meet together with her sisters and her son.  She asked  if we could speak again perhaps tomorrow.  Questions and concerns were addressed.  Hard Choices booklet left for review. The family was encouraged to call with questions or concerns.     Primary Decision Maker:  NEXT OF KIN 3 daughters    SUMMARY OF RECOMMENDATIONS    PMT will continue to follow with you.   Follow up family meeting will need to be arranged to discuss options for nutrition and discharge.    Code Status/Advance Care Planning:  DNR  Psycho-social/Spiritual:   Desire for further Chaplaincy support: yes  Prognosis:  To be determined.      Discharge Planning: To Be Determined      Primary Diagnoses: Present on Admission: . Perforated peptic ulcer ([place])   I have reviewed the medical record, interviewed the patient and family, and examined the patient. The following aspects are pertinent.  Past Medical History:  Diagnosis Date  . Allergy   . Anxiety   . Diverticulosis   . History of colon polyps   . History of hemorrhoids   . History of  inguinal herniorrhaphy    right  . Hyperlipidemia   . Hypogonadism, male   . Sciatica    right   Social History   Socioeconomic History  . Marital status: Widowed    Spouse name: Not on file  . Number of children: Not on file  . Years of education: Not on file  . Highest education level: Not on file  Occupational History  . Not on file  Social Needs  . Financial resource strain: Not on file  . Food insecurity:    Worry: Not on file    Inability: Not on file  . Transportation needs:    Medical: Not on file    Non-medical: Not on file  Tobacco Use  . Smoking status: Never Smoker  . Smokeless tobacco: Never Used  Substance and Sexual Activity  . Alcohol use: Yes    Comment: occasionally  . Drug use: No  . Sexual activity: Not on file  Lifestyle  . Physical activity:    Days per week: Not on file    Minutes per session: Not on file  . Stress: Not on file  Relationships  . Social connections:    Talks on phone: Not on file    Gets together: Not on file    Attends religious service: Not on file    Active member of club or organization: Not on file    Attends meetings of clubs or organizations: Not on file    Relationship status: Not on file  Other Topics Concern  . 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 Cancer Mother        colon  . Cancer Father        prostate  . Colon polyps Brother   . Diabetes Brother   . Heart disease Brother    Scheduled Meds: . albuterol  2.5 mg Nebulization BID  . feeding supplement  1 Container Oral TID BM  . mouth rinse  15 mL Mouth Rinse BID  . pantoprazole  40 mg Intravenous Q12H  . sodium bicarbonate  50 mEq Intravenous BID  . sodium chloride (PF)      .  sucralfate  1 g Oral Q6H   Continuous Infusions: . amiodarone 30 mg/hr (02/07/18 1000)  . aztreonam Stopped (02/07/18 0724)  . diltiazem (CARDIZEM) infusion 10 mg/hr (02/07/18 1000)  . metronidazole Stopped (02/07/18 2409)  . potassium chloride 10 mEq (02/07/18 1246)   PRN Meds:.acetaminophen **OR** acetaminophen, ALPRAZolam, iohexol, ipratropium-albuterol, morphine injection, ondansetron (ZOFRAN) IV Allergies  Allergen Reactions  . Penicillins Anaphylaxis, Hives, Shortness Of Breath and Rash    Has patient had a PCN reaction causing immediate rash, facial/tongue/throat swelling, SOB or lightheadedness with hypotension: Yes Has patient had a PCN reaction causing severe rash involving mucus membranes or skin necrosis: Yes Has patient had a PCN reaction that required hospitalization Yes Has patient had a PCN reaction occurring within the last 10 years: No If all of the above answers are "NO", then may proceed with Cephalosporin use.   . Procaine Hcl Anaphylaxis and Swelling  . Wasp Venom Swelling    Swelling of throat and swelling at the site    Review of Systems patient too fatigued to converse  Physical Exam  Well developed elderly male, awake, but too fatigued/ill to interact cv tachy Resp no distress Ext upper ext are swollen and cold.  Vital Signs: BP (!) 107/50   Pulse (!) 114   Temp 97.7 F (36.5 C) (Axillary)   Resp 20   Ht '5\' 9"'$  (1.753 m)   Wt 87.6 kg   SpO2 93%   BMI 28.52 kg/m  Pain Scale: Faces POSS *See Group Information*: S-Acceptable,Sleep, easy to arouse Pain Score: 0-No pain   SpO2: SpO2: 93 % O2 Device:SpO2: 93 % O2 Flow Rate: .O2 Flow Rate (L/min): 2 L/min  IO: Intake/output summary:   Intake/Output Summary (Last 24 hours) at 02/07/2018 1259 Last data filed at 02/07/2018 1000 Gross per 24 hour  Intake 2233.07 ml  Output 660 ml  Net 1573.07 ml    LBM: Last BM Date: 02/07/18 Baseline Weight: Weight: 75.8 kg Most recent weight: Weight: 87.6 kg      Palliative Assessment/Data: 20%   Flowsheet Rows     Most Recent Value  Intake Tab  Referral Department  Hospitalist  Unit at Time of Referral  ICU  Date Notified  02/06/18  Palliative Care Type  New Palliative care  Reason for referral  Clarify Goals of Care  Date of Admission  01/31/18  # of days IP prior to Palliative referral  6  Clinical Assessment  Psychosocial & Spiritual Assessment  Palliative Care Outcomes      Time In: 1:30 Time Out: 2:40 Time Total: 70 min. Greater than 50%  of this time was spent counseling and coordinating care related to the above assessment and plan.  Signed by: Florentina Jenny, PA-C Palliative Medicine Pager: [phone]  Please contact Palliative Medicine Team phone at [phone] for questions and concerns.  For individual provider: See Shea Evans

## 2018-02-07 NOTE — Progress Notes (Signed)
Pharmacy Antibiotic Note  Manuel Reilly is a 84 y.o. male admitted on 01/31/2018 with perforated GU and DU.  Pharmacy has been consulted for meropenem dosing.  Patient with reported anaphylaxis to PCN - unable to get more specific info from patient. Patient was on ciprofloxacin/metronidazole but ciprofloxacin changed to aztreonam due to QTc prolongation. Patient WBC elevated. ID consulted 12/4   Plan:  Based on SCr trend and borderline CrCl,  Started meropenem 1gm q12h  Height: 5\' 9"  (175.3 cm) Weight: 193 lb 2 oz (87.6 kg) IBW/kg (Calculated) : 70.7  Temp (24hrs), Avg:97.4 F (36.3 C), Min:96.7 F (35.9 C), Max:97.8 F (36.6 C)  Recent Labs  Lab 02/03/18 0320 02/04/18 0316 02/05/18 0356 02/06/18 0258 02/07/18 0433 02/07/18 0635 02/07/18 0939 02/07/18 1334  WBC 10.8* 11.6* 20.1* 19.1*  --  19.6*  --   --   CREATININE 0.75 0.90 0.78 0.94 1.05  --   --   --   LATICACIDVEN  --  1.9 1.9  --  3.2*  --  3.6* 2.6*    Estimated Creatinine Clearance: 49.2 mL/min (by C-G formula based on SCr of 1.05 mg/dL).    Allergies  Allergen Reactions  . Penicillins Anaphylaxis, Hives, Shortness Of Breath and Rash    Has patient had a PCN reaction causing immediate rash, facial/tongue/throat swelling, SOB or lightheadedness with hypotension: Yes Has patient had a PCN reaction causing severe rash involving mucus membranes or skin necrosis: Yes Has patient had a PCN reaction that required hospitalization Yes Has patient had a PCN reaction occurring within the last 10 years: No If all of the above answers are "NO", then may proceed with Cephalosporin use.   . Procaine Hcl Anaphylaxis and Swelling  . Wasp Venom Swelling    Swelling of throat and swelling at the site     Antimicrobials this admission: 11/27 cipro >> 12/2 11/27 flagyl >> 12/4 12/2 Aztreonam >> 12/4 12/4 meropenem >>  Microbiology results: 11/28 MRSA PCR: negative   Thank you for allowing pharmacy to be a part of this  patient's care.  Juliette Alcideustin Zeigler, PharmD, BCPS.   Work Cell: [phone] 02/07/2018 3:57 PM

## 2018-02-07 NOTE — Progress Notes (Signed)
CRITICAL VALUE ALERT  Critical Value:  Lactic Acid 3.6  Date & Time Notied:  02/07/18 1050  Provider Notified: Dr. Sunnie Nielsenegalado  Orders Received/Actions taken: 500 ml NS bolus

## 2018-02-07 NOTE — Progress Notes (Signed)
CRITICAL VALUE ALERT  Critical Value: CT Abd result-portal venous thrombosis  Date & Time Notied:  02/07/18 1235  Provider Notified: Zola ButtonWill Jennings, PA  Orders Received/Actions taken: Made Aware

## 2018-02-07 NOTE — Consult Note (Signed)
[facility]  Total days of antibiotics 8        Day 3 aztreo/8 metronidazole               Reason for Consult:perforated peptic ulcer disease   Referring Physician: regalado  Principal Problem:   Perforated peptic ulcer (HCC) Active Problems:   COPD (chronic obstructive pulmonary disease) (HCC)   Elevated troponin   Pleural effusion   HTN (hypertension)    HPI: Manuel Reilly is a 84 y.o. male with hx of HTN, COPD/emphysema, diverticulosis, hx of esophageal impaction in July 2019, who has had intermittent abdomina pain but started to be more painful/persistent which brought him to the ED on  was admitted on 11/27. Imaging suggestive of perforated peptic ulcer with small foci of extraluminal air in the porta hepatis and RUQ, with wall thickening in peripyloric and proximal duodenal region. No abscess noted. He was started on cipro plus metronidazole with surgery making recommendations for medical management. He had NG to help decompress the region, but in the last few days no improvement with his leukocytosis. He underwent repeat abd CT that now shows portal vein thrombosis but no signs of free air per my review. Official read also comments on-- there is increase fluid in the area /wall thickening of gastric antrum and proximal duodenum plus SB dilatation c/w ileus. He remains fatigued,afebrile, hemodynamic stable. Given his worsening leukocytosis and LA at 2.6, ID asked to weigh in on abtx choice given hx of anaphylaxis to pcn as teenager- per his daughter's report who is at his bedside.  Past Medical History:  Diagnosis Date  . Allergy   . Anxiety   . Diverticulosis   . History of colon polyps   . History of hemorrhoids   . History of inguinal herniorrhaphy    right  . Hyperlipidemia   . Hypogonadism, male   . Sciatica    right    Allergies:  Allergies  Allergen Reactions  . Penicillins Anaphylaxis, Hives, Shortness Of Breath and Rash    Has  patient had a PCN reaction causing immediate rash, facial/tongue/throat swelling, SOB or lightheadedness with hypotension: Yes Has patient had a PCN reaction causing severe rash involving mucus membranes or skin necrosis: Yes Has patient had a PCN reaction that required hospitalization Yes Has patient had a PCN reaction occurring within the last 10 years: No If all of the above answers are "NO", then may proceed with Cephalosporin use.   . Procaine Hcl Anaphylaxis and Swelling  . Wasp Venom Swelling    Swelling of throat and swelling at the site     MEDICATIONS: . albuterol  2.5 mg Nebulization BID  . feeding supplement  1 Container Oral TID BM  . mouth rinse  15 mL Mouth Rinse BID  . pantoprazole  40 mg Intravenous Q12H  . sodium bicarbonate  50 mEq Intravenous BID  . sodium chloride (PF)      . sucralfate  1 g Oral Q6H    Social History   Tobacco Use  . Smoking status: Never Smoker  . Smokeless tobacco: Never Used  Substance Use Topics  . Alcohol use: Yes    Comment: occasionally  . Drug use: No    Family History  Problem Relation Age of Onset  . Cancer Mother        colon  . Cancer Father        prostate  . Colon polyps Brother   . Diabetes  Brother   . Heart disease Brother      Review of Systems  Constitutional: Negative for fever, chills, diaphoresis, activity change, appetite change, fatigue and unexpected weight change.  HENT: Negative for congestion, sore throat, rhinorrhea, sneezing, trouble swallowing and sinus pressure.  Eyes: Negative for photophobia and visual disturbance.  Respiratory: Negative for cough, chest tightness, shortness of breath, wheezing and stridor.  Cardiovascular: Negative for chest pain, palpitations and leg swelling.  Gastrointestinal: + abdominal discomfort. Negative for nausea, vomiting, abdominal pain, diarrhea, constipation, blood in stool,  and anal bleeding.  Genitourinary: Negative for dysuria, hematuria, flank pain and  difficulty urinating.  Musculoskeletal: Negative for myalgias, back pain, joint swelling, arthralgias and gait problem.  Skin: Negative for color change, pallor, rash and wound.  Neurological: Negative for dizziness, tremors, weakness and light-headedness.  Hematological: Negative for adenopathy. Does not bruise/bleed easily.  Psychiatric/Behavioral: Negative for behavioral problems, confusion, sleep disturbance, dysphoric mood, decreased concentration and agitation.       OBJECTIVE: Temp:  [96.7 F (35.9 C)-97.8 F (36.6 C)] 97.7 F (36.5 C) (12/04 1200) Pulse Rate:  [111-119] 115 (12/04 1500) Resp:  [14-26] 25 (12/04 1500) BP: (98-119)/(39-58) 107/40 (12/04 1500) SpO2:  [91 %-97 %] 97 % (12/04 1500) Weight:  [87.6 kg] 87.6 kg (12/04 0500) Physical Exam  Constitutional: He is oriented to person, only. He appears his stated age and under-nourished. No distress.  HENT: poor dentition, facial wasting Mouth/Throat: Oropharynx is clear and moist. No oropharyngeal exudate.  Cardiovascular: Normal rate, regular rhythm and normal heart sounds. Exam reveals no gallop and no friction rub.  No murmur heard.  Pulmonary/Chest: Effort normal and breath sounds normal. No respiratory distress. He has no wheezes.  Abdominal: tight Bowel sounds are decreased. He has signficant distension. mild tenderness. No guarding Ext: + anasarca to BUE and LUE with weeping on upper extremities Neurological: He is alert and oriented to person, place, and time.  Skin: Skin is warm and dry. No rash noted. No erythema. Scattered echymosis Psychiatric: sleeping, slow to arouse   LABS: Results for orders placed or performed during the hospital encounter of 01/31/18 (from the past 48 hour(s))  CBC     Status: Abnormal   Collection Time: 02/06/18  2:58 AM  Result Value Ref Range   WBC 19.1 (H) 4.0 - 10.5 K/uL   RBC 4.58 4.22 - 5.81 MIL/uL   Hemoglobin 13.6 13.0 - 17.0 g/dL   HCT 65.7 84.6 - 96.2 %   MCV 91.7  80.0 - 100.0 fL   MCH 29.7 26.0 - 34.0 pg   MCHC 32.4 30.0 - 36.0 g/dL   RDW 95.2 (H) 84.1 - 32.4 %   Platelets 161 150 - 400 K/uL   nRBC 0.0 0.0 - 0.2 %    Comment: Performed at [facility], [address] [place], [place] [address]  Basic metabolic panel     Status: Abnormal   Collection Time: 02/06/18  2:58 AM  Result Value Ref Range   Sodium 139 135 - 145 mmol/L   Potassium 4.3 3.5 - 5.1 mmol/L   Chloride 112 (H) 98 - 111 mmol/L   CO2 16 (L) 22 - 32 mmol/L   Glucose, Bld 119 (H) 70 - 99 mg/dL   BUN 35 (H) 8 - 23 mg/dL   Creatinine, Ser 7.25 0.61 - 1.24 mg/dL   Calcium 7.8 (L) 8.9 - 10.3 mg/dL   GFR calc non Af Amer >60 >60 mL/min   GFR calc Af Amer >  60 >60 mL/min   Anion gap 11 5 - 15    Comment: Performed at [facility], [address], [place], [place] [address]  Magnesium     Status: None   Collection Time: 02/06/18  2:58 AM  Result Value Ref Range   Magnesium 2.1 1.7 - 2.4 mg/dL    Comment: Performed at [facility], [address] [place], [place] [address]  Basic metabolic panel     Status: Abnormal   Collection Time: 02/07/18  4:33 AM  Result Value Ref Range   Sodium 142 135 - 145 mmol/L   Potassium 3.2 (L) 3.5 - 5.1 mmol/L    Comment: DELTA CHECK NOTED   Chloride 116 (H) 98 - 111 mmol/L   CO2 21 (L) 22 - 32 mmol/L   Glucose, Bld 136 (H) 70 - 99 mg/dL   BUN 41 (H) 8 - 23 mg/dL   Creatinine, Ser 8.29 0.61 - 1.24 mg/dL   Calcium 6.5 (L) 8.9 - 10.3 mg/dL   GFR calc non Af Amer >60 >60 mL/min   GFR calc Af Amer >60 >60 mL/min   Anion gap 5 5 - 15    Comment: Performed at [facility], [address], [place], [place] [address]  Magnesium     Status: Abnormal   Collection Time: 02/07/18  4:33 AM  Result Value Ref Range   Magnesium 1.6 (L) 1.7 - 2.4 mg/dL    Comment: Performed at [facility], [address] [place], [place] [address]  Lactic acid, plasma      Status: Abnormal   Collection Time: 02/07/18  4:33 AM  Result Value Ref Range   Lactic Acid, Venous 3.2 (HH) 0.5 - 1.9 mmol/L    Comment: CRITICAL RESULT CALLED TO, READ BACK BY AND VERIFIED WITH: HENLEY,R RN AT 8469 02/07/18 BY TIBBITTS,K Performed at [facility], [address], [place], [place] [address]   CBC with Differential/Platelet     Status: Abnormal   Collection Time: 02/07/18  6:35 AM  Result Value Ref Range   WBC 19.6 (H) 4.0 - 10.5 K/uL   RBC 4.91 4.22 - 5.81 MIL/uL   Hemoglobin 14.5 13.0 - 17.0 g/dL   HCT 84.1 32.4 - 40.1 %   MCV 87.0 80.0 - 100.0 fL   MCH 29.5 26.0 - 34.0 pg   MCHC 34.0 30.0 - 36.0 g/dL   RDW 02.7 (H) 25.3 - 66.4 %   Platelets 171 150 - 400 K/uL   nRBC 0.1 0.0 - 0.2 %   Neutrophils Relative % 87 %   Neutro Abs 16.8 (H) 1.7 - 7.7 K/uL   Lymphocytes Relative 5 %   Lymphs Abs 1.0 0.7 - 4.0 K/uL   Monocytes Relative 6 %   Monocytes Absolute 1.2 (H) 0.1 - 1.0 K/uL   Eosinophils Relative 0 %   Eosinophils Absolute 0.1 0.0 - 0.5 K/uL   Basophils Relative 0 %   Basophils Absolute 0.1 0.0 - 0.1 K/uL   Immature Granulocytes 2 %   Abs Immature Granulocytes 0.45 (H) 0.00 - 0.07 K/uL    Comment: Performed at [facility], [address], [place], [place] [address]  Glucose, capillary     Status: Abnormal   Collection Time: 02/07/18  7:41 AM  Result Value Ref Range   Glucose-Capillary 122 (H) 70 - 99 mg/dL   Comment 1 Notify RN    Comment 2 Document in Chart  Lactic acid, plasma     Status: Abnormal   Collection Time: 02/07/18  9:39 AM  Result Value Ref Range   Lactic Acid, Venous 3.6 (HH) 0.5 - 1.9 mmol/L    Comment: CRITICAL RESULT CALLED TO, READ BACK BY AND VERIFIED WITHNormand Sloop RN AT 1036 02/07/18 MULLINS,T Performed at [facility], [address] [place], [place] [address]   Lactic acid, plasma     Status: Abnormal   Collection Time: 02/07/18  1:34 PM  Result Value Ref Range    Lactic Acid, Venous 2.6 (HH) 0.5 - 1.9 mmol/L    Comment: CRITICAL RESULT CALLED TO, READ BACK BY AND VERIFIED WITHNormand Sloop RN AT 1419 02/07/18 MULLINS,T Performed at [facility], [address] [place], [place] [address]     MICRO: 12/2 blood cx ngtd IMAGING: Ct Abdomen Pelvis W Contrast  Result Date: 02/07/2018 CLINICAL DATA:  History of perforated ulcer. EXAM: CT ABDOMEN AND PELVIS WITH CONTRAST TECHNIQUE: Multidetector CT imaging of the abdomen and pelvis was performed using the standard protocol following bolus administration of intravenous contrast. CONTRAST:  OMNIPAQUE IOHEXOL 300 MG/ML  SOLN COMPARISON:  CT scan of January 31, 2018. FINDINGS: Lower chest: Moderate loculated pleural effusions are noted bilaterally with adjacent atelectasis. Hepatobiliary: Gallbladder is dilated. No definite gallstones are noted. Stable left hepatic cyst is noted. 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 Increased amount of fluid is noted in the porta hepatis region. Pancreas: Fatty replacement of the pancreas is again noted. No definite inflammation or ductal dilatation is noted. Spleen: Normal in size without focal abnormality. Adrenals/Urinary Tract: Adrenal glands appear normal. Stable right renal cyst is noted. No hydronephrosis or renal obstruction is noted. No renal or ureteral calculi are noted. Urinary bladder is decompressed secondary to Foley catheter. Stomach/Bowel: Stable gastric distention is noted. Wall thickening is seen involving the gastric antrum and proximal duodenum with surrounding fluid concerning for peptic ulcer disease. No free air is noted at this time. Dilated small bowel loops are noted concerning for ileus or distal small bowel obstruction. Diverticulosis of proximal sigmoid colon is noted. There is no evidence of colonic dilatation. The appendix is not clearly visualized. Vascular/Lymphatic: Aortic atherosclerosis. No enlarged abdominal or pelvic  lymph nodes. Reproductive: Mild prostatic calcifications are noted. Other: Mild anasarca is noted. Fat containing left inguinal hernia is noted. Musculoskeletal: No acute or significant osseous findings. IMPRESSION: Thrombosis of main portal vein is noted. These results will be called to the ordering clinician or representative by the Radiologist Assistant, and communication documented in the PACS or zVision Dashboard. Wall thickening of gastric antrum and proximal duodenum is noted with an increased amount of fluid in this area and in the porta hepatis region, most consistent with peptic ulcer disease. There is no free air or pneumoperitoneum is noted at this time. Small bowel dilatation is noted concerning for ileus or distal small bowel obstruction. Fatty replacement of the pancreas is again noted. Mild anasarca. Sigmoid diverticulosis without inflammation. Moderate bilateral loculated pleural effusions are noted with adjacent atelectasis. Aortic Atherosclerosis (ICD10-I70.0). Electronically Signed   By: Lupita Raider, M.D.   On: 02/07/2018 12:24   Dg Swallowing Func-speech Pathology  Result Date: 02/06/2018 Objective Swallowing Evaluation: Type of Study: MBS-Modified Barium Swallow Study  Patient Details Name: ARHAAN CHESNUT MRN: [id number] Date of Birth: Dec 19, 1923 Today's Date: 02/06/2018 Time: SLP Start Time (ACUTE ONLY): 1415 -SLP Stop Time (ACUTE ONLY): 1455 SLP Time Calculation (min) (ACUTE ONLY):  40 min Past Medical History: Past Medical History: Diagnosis Date . Allergy  . Anxiety  . Diverticulosis  . History of colon polyps  . History of hemorrhoids  . History of inguinal herniorrhaphy   right . Hyperlipidemia  . Hypogonadism, male  . Sciatica   right Past Surgical History: Past Surgical History: Procedure Laterality Date . CARPAL TUNNEL RELEASE   . HERNIA REPAIR   HPI: pt is a 84 yo male adm [facility] with abdomen pain- found to have perforated peptic ulcer.   Today pt with increased ATX at lung  bases, WBC elevation.  Swallow evaluation ordered.  Subjective: pt awake in chair Assessment / Plan / Recommendation CHL IP CLINICAL IMPRESSIONS 02/06/2018 Clinical Impression Patient presents with moderately severe oropharyngeal dysphagia mostly characterized by weakness.  Pt as only given approximately 9 boluses of barium - thin, nectar and pudding.  Poor oral transiting/bolus cohesion skills due to weakness noted which results in premature spillage and oral residuals.  Pt also extended head upward to aid oral transiting with liquids of increased viscocity.  In addition, pharyngeal swallow is weak resulting in residuals that patient intermittently senses.  Multiple swallows with all consistencies assist to decrease residuals but did not full eliminate them - thus increasing pt's aspiration risk.  Although he did NOT aspirate or penetrate, certain he does experience aspiration due to this level of weakness and residuals.  At this time, would recommend to continue clear liquids via tsp only - with pt swallowing multiple times with each tsp.  NO CUPS/STRAWS nor thicker consistencies.  Pt educated to findings/recommendation but uncertain to his level of comprehension.  Thankful pt is for palliative consult as concerned for his ability to meet nutritional needs with level of dysphagia.   SLP Visit Diagnosis Dysphagia, oropharyngeal phase (R13.12) Attention and concentration deficit following -- Frontal lobe and executive function deficit following -- Impact on safety and function Severe aspiration risk;Risk for inadequate nutrition/hydration;Moderate aspiration risk   CHL IP TREATMENT RECOMMENDATION 02/06/2018 Treatment Recommendations Therapy as outlined in treatment plan below   Prognosis 02/06/2018 Prognosis for Safe Diet Advancement Guarded Barriers to Reach Goals -- Barriers/Prognosis Comment -- CHL IP DIET RECOMMENDATION 02/06/2018 SLP Diet Recommendations Thin liquid Liquid Administration via No straw;Spoon  Medication Administration Other (Comment) Compensations Small sips/bites;Slow rate;Multiple dry swallows after each bite/sip Postural Changes Remain semi-upright after after feeds/meals (Comment);Seated upright at 90 degrees   CHL IP OTHER RECOMMENDATIONS 02/06/2018 Recommended Consults -- Oral Care Recommendations Oral care QID Other Recommendations Have oral suction available   CHL IP FOLLOW UP RECOMMENDATIONS 02/06/2018 Follow up Recommendations (No Data)   CHL IP FREQUENCY AND DURATION 02/06/2018 Speech Therapy Frequency (ACUTE ONLY) min 1 x/week Treatment Duration 1 week      CHL IP ORAL PHASE 02/06/2018 Oral Phase Impaired Oral - Pudding Teaspoon -- Oral - Pudding Cup -- Oral - Honey Teaspoon -- Oral - Honey Cup -- Oral - Nectar Teaspoon -- Oral - Nectar Cup Weak lingual manipulation;Delayed oral transit;Decreased bolus cohesion;Reduced posterior propulsion Oral - Nectar Straw Weak lingual manipulation;Delayed oral transit;Decreased bolus cohesion;Reduced posterior propulsion Oral - Thin Teaspoon Weak lingual manipulation;Delayed oral transit;Decreased bolus cohesion;Reduced posterior propulsion Oral - Thin Cup Weak lingual manipulation;Delayed oral transit;Decreased bolus cohesion;Reduced posterior propulsion Oral - Thin Straw Weak lingual manipulation;Delayed oral transit;Decreased bolus cohesion;Reduced posterior propulsion Oral - Puree Weak lingual manipulation;Delayed oral transit;Decreased bolus cohesion;Reduced posterior propulsion;Holding of bolus Oral - Mech Soft -- Oral - Regular -- Oral - Multi-Consistency -- Oral - Pill --  Oral Phase - Comment pt extended head upward to aid oral transiting of boluses, excessively delayed with pudding - needing liquids to transit piecemealed portion of bolus  CHL IP PHARYNGEAL PHASE 02/06/2018 Pharyngeal Phase Impaired Pharyngeal- Pudding Teaspoon -- Pharyngeal -- Pharyngeal- Pudding Cup -- Pharyngeal -- Pharyngeal- Honey Teaspoon -- Pharyngeal -- Pharyngeal- Honey Cup  -- Pharyngeal -- Pharyngeal- Nectar Teaspoon Pharyngeal residue - valleculae;Pharyngeal residue - pyriform;Reduced tongue base retraction;Reduced epiglottic inversion;Reduced pharyngeal peristalsis;Reduced laryngeal elevation;Reduced airway/laryngeal closure Pharyngeal -- Pharyngeal- Nectar Cup -- Pharyngeal -- Pharyngeal- Nectar Straw Pharyngeal residue - valleculae;Pharyngeal residue - pyriform;Reduced pharyngeal peristalsis;Reduced epiglottic inversion;Reduced tongue base retraction;Reduced laryngeal elevation;Reduced airway/laryngeal closure Pharyngeal -- Pharyngeal- Thin Teaspoon Pharyngeal residue - valleculae;Pharyngeal residue - pyriform;Reduced pharyngeal peristalsis;Reduced epiglottic inversion;Reduced tongue base retraction;Reduced laryngeal elevation;Reduced airway/laryngeal closure Pharyngeal -- Pharyngeal- Thin Cup Pharyngeal residue - valleculae;Pharyngeal residue - pyriform;Reduced pharyngeal peristalsis;Reduced epiglottic inversion;Reduced tongue base retraction;Reduced laryngeal elevation;Reduced airway/laryngeal closure Pharyngeal -- Pharyngeal- Thin Straw Pharyngeal residue - valleculae;Pharyngeal residue - pyriform;Reduced pharyngeal peristalsis;Reduced epiglottic inversion;Reduced tongue base retraction;Reduced airway/laryngeal closure;Reduced laryngeal elevation Pharyngeal -- Pharyngeal- Puree Pharyngeal residue - valleculae;Pharyngeal residue - pyriform;Reduced epiglottic inversion;Reduced pharyngeal peristalsis;Reduced tongue base retraction;Reduced laryngeal elevation;Reduced airway/laryngeal closure Pharyngeal -- Pharyngeal- Mechanical Soft -- Pharyngeal -- Pharyngeal- Regular -- Pharyngeal -- Pharyngeal- Multi-consistency -- Pharyngeal -- Pharyngeal- Pill -- Pharyngeal -- Pharyngeal Comment pt does not sense residuals, multiple swallows decrease pharyngeal resdiuals but do not fully clear them,  he is at aspiration due to residuals despite not aspirating  CHL IP CERVICAL ESOPHAGEAL PHASE  02/06/2018 Cervical Esophageal Phase Impaired Pudding Teaspoon -- Pudding Cup -- Honey Teaspoon -- Honey Cup -- Nectar Teaspoon -- Nectar Cup -- Nectar Straw -- Thin Teaspoon -- Thin Cup -- Thin Straw -- Puree -- Mechanical Soft -- Regular -- Multi-consistency -- Pill -- Cervical Esophageal Comment -- Chales Abrahams 02/06/2018, 3:00 PM  Donavan Burnet, MS [facility] SLP Acute Rehab Services Pager [phone] Office [phone]              Assessment/Plan:  84yo M with perforated peptic ulcer disease with ongoing leukocytosis with left shift, complicated portal vein thrombosis   - will change his regimen to meropenem and d/c aztreo and d/c metronidazole - agree with starting heparin for PVT and may see decrease in leukocytosis  Distended abdomen = would continue to monitor, may need to have ng again   Hx of pcn allergy= should be able to tolerate meropenem. Will continue to monitor while on this abtx  Gowri Suchan B. Drue Second MD MPH [facility] [phone]

## 2018-02-07 NOTE — Progress Notes (Signed)
7 Days Post-Op    CC: Perforated gastric ulcer  Subjective: Patient is sitting up in bed he appears short of breath and says he is always short of breath.  When you ask him how he is he says terrible.  He is fairly dyspneic just speaking and it is difficult to understand him this a.m. Abdomen is mildly distended but nontender to palpation with positive bowel sounds.  Objective: Vital signs in last 24 hours: Temp:  [96.7 F (35.9 C)-97.7 F (36.5 C)] 97.7 F (36.5 C) (12/04 0355) Pulse Rate:  [91-116] 115 (12/04 0700) Resp:  [13-26] 15 (12/04 0700) BP: (100-136)/(39-60) 112/43 (12/04 0700) SpO2:  [90 %-96 %] 92 % (12/04 0700) Weight:  [87.4 kg-87.6 kg] 87.6 kg (12/04 0500) Last BM Date: 02/07/18 1109 IV 720 urine BM x1 Afebrile BPs in 119 04/07/2001 range yesterday. He remains tachycardic. WBC remains elevated at 19.6. Potassium 3.4, mag 1.6 -potassium/magnesium replacement ordered by medicine Intake/Output from previous day: 12/03 0701 - 12/04 0700 In: 1109.9 [I.V.:809.9; IV Piggyback:300] Out: 720 [Urine:720] Intake/Output this shift: No intake/output data recorded.  General appearance: alert, cooperative and no distress Resp: He appears dyspneic on oxygen.  Mild wheezing, some rales. GI: He may be mildly distended positive bowel sounds, no peritonitis no guarding or rebound.  Lab Results:  Recent Labs    02/06/18 0258 02/07/18 0635  WBC 19.1* 19.6*  HGB 13.6 14.5  HCT 42.0 42.7  PLT 161 171    BMET Recent Labs    02/06/18 0258 02/07/18 0433  NA 139 142  K 4.3 3.2*  CL 112* 116*  CO2 16* 21*  GLUCOSE 119* 136*  BUN 35* 41*  CREATININE 0.94 1.05  CALCIUM 7.8* 6.5*   PT/INR No results for input(s): LABPROT, INR in the last 72 hours.  Recent Labs  Lab 01/31/18 1708 02/01/18 0754 02/02/18 0324  AST 70* 46* 27  ALT 90* 64* 44  ALKPHOS 102 84 83  BILITOT 1.1 0.9 1.1  PROT 6.2* 5.6* 5.0*  ALBUMIN 3.4* 3.2* 2.6*     Lipase     Component Value  Date/Time   LIPASE 59 (H) 01/31/2018 1708     Medications: . albuterol  2.5 mg Nebulization BID  . mouth rinse  15 mL Mouth Rinse BID  . pantoprazole  40 mg Intravenous Q12H  . sodium bicarbonate  50 mEq Intravenous BID  . sucralfate  1 g Oral Q6H   . amiodarone 30 mg/hr (02/07/18 0800)  . aztreonam Stopped (02/07/18 0724)  . diltiazem (CARDIZEM) infusion 10 mg/hr (02/07/18 0800)  . magnesium sulfate 1 - 4 g bolus IVPB 2 g (02/07/18 0800)  . metronidazole Stopped (02/07/18 [id number])  . potassium chloride    . sodium chloride 250 mL/hr at 02/07/18 0800     Assessment/Plan  Hx RIH Repair 2006/colonoscopy 2004 - Dr. Russella DarStark Delirium - Ativan New AF w RVR/ elevated Troponin/demand ischemia - Amiodarone/Cardizem HTN: stableBP but low Diverticulosis COPD/Emphysema-On an inhaler -CXR 12/2: Worsening atelectasis both lower lungs Hard of hearing Right scrotal hydrocele Deconditioning/malnutrition - prealbumin ordered Hypokalemia/hypomagnesemia -being replaced.   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 -Upper GI12/2/19to assess integrity of self patch- Negative for gastric or duodenal                  extravasation.  Numerous duodenal diverticula.  - started clears 02/05/18  - original CT 11/27, WBC: 10.8>>11.6>>20 0.1>>19.1 - today  FEN: IV fluids/NPO =>>clears 12/2  ID: Cipro 11/28 -12/2; Azactam 12/2 =>> day 2/Flagyl 11/27 =>>day 7 DVT: none SCD's ordered Follow up: TBD  Plan: We are planning to get a CT scan with contrast today.  He can only swallow a teaspoon at a time so  We expect it to take some time to get done.  I do not want to put an NG tube in to do the CT scan. He just looks to frail and tired to do that.   I have asked the nurse just give him a teaspoon at a time and try and get at least the first bottle in, will accept what ever  we can get.  We are trying to gather all this information now in anticipation of a palliative consult afterwards.  The family said he was living independently and driving prior to admission so this is a huge decline for him.     LOS: 7 days    Manuel Reilly 02/07/2018 [phone]

## 2018-02-07 NOTE — Progress Notes (Signed)
ANTICOAGULATION CONSULT NOTE  Pharmacy Consult for Heparin Indication: portal vein thrombosis  Allergies  Allergen Reactions  . Penicillins Anaphylaxis, Hives, Shortness Of Breath and Rash    Has patient had a PCN reaction causing immediate rash, facial/tongue/throat swelling, SOB or lightheadedness with hypotension: Yes Has patient had a PCN reaction causing severe rash involving mucus membranes or skin necrosis: Yes Has patient had a PCN reaction that required hospitalization Yes Has patient had a PCN reaction occurring within the last 10 years: No If all of the above answers are "NO", then may proceed with Cephalosporin use.   . Procaine Hcl Anaphylaxis and Swelling  . Wasp Venom Swelling    Swelling of throat and swelling at the site    Patient Measurements: Height: 5\' 9"  (175.3 cm) Weight: 193 lb 2 oz (87.6 kg) IBW/kg (Calculated) : 70.7  Vital Signs: Temp: 97.7 F (36.5 C) (12/04 1200) Temp Source: Axillary (12/04 1200) BP: 105/39 (12/04 1400) Pulse Rate: 115 (12/04 1400)  Labs: Recent Labs    02/05/18 0356 02/06/18 0258 02/07/18 0433 02/07/18 0635  HGB 14.5 13.6  --  14.5  HCT 44.3 42.0  --  42.7  PLT 136* 161  --  171  CREATININE 0.78 0.94 1.05  --    Estimated Creatinine Clearance: 49.2 mL/min (by C-G formula based on SCr of 1.05 mg/dL).  Medical History: Past Medical History:  Diagnosis Date  . Allergy   . Anxiety   . Diverticulosis   . History of colon polyps   . History of hemorrhoids   . History of inguinal herniorrhaphy    right  . Hyperlipidemia   . Hypogonadism, male   . Sciatica    right   Medications:  Scheduled:  . albuterol  2.5 mg Nebulization BID  . feeding supplement  1 Container Oral TID BM  . mouth rinse  15 mL Mouth Rinse BID  . pantoprazole  40 mg Intravenous Q12H  . sodium bicarbonate  50 mEq Intravenous BID  . sodium chloride (PF)      . sucralfate  1 g Oral Q6H   Infusions:  . amiodarone 30 mg/hr (02/07/18 1000)  .  aztreonam 1 g (02/07/18 1353)  . diltiazem (CARDIZEM) infusion 10 mg/hr (02/07/18 1349)  . heparin    . metronidazole 500 mg (02/07/18 1453)   Assessment: [name] with perforated peptic ulcer, new-onset AFIB. 12/4 abd CT with portal vein thrombosis.  Begin IV Heparin, no bolus  Goal of Therapy:  Heparin level 0.3-0.7 units/ml Monitor CBC, s/s bleed   Plan:   Begin Heparin infusion at 1100 units/hr  Check Heparin level 8 hr after start of infusion  Daily CBC, daily Heparin level when at steady state  Otho BellowsGreen, Esiquio Boesen L PharmD Pager [phone] 02/07/2018, 3:04 PM

## 2018-02-07 NOTE — Progress Notes (Addendum)
PROGRESS NOTE    Manuel Reilly  UJW:[id number] DOB: 09-Nov-1923 DOA: 01/31/2018 PCP: Myrlene Brokerrawford, Elizabeth A, MD    Brief Narrative: 84 year old with past medical history significant for diverticulosis, inguinal hernia repair, colon polyps, COPD who presented with abdominal pain for the last 6 months pain got really worse over the last 2 days prior to admission.  CT abdomen and pelvis show highly suspicious for for perforated peptic  ulcer with a small foci of extraluminal air in the porta hepatis.  Patient was admitted to the stepdown unit.  he was started on IV fluid, IV antibiotics.  He was evaluated by surgery, who recommended conservative management.  Surgery continue to follow on patient.  Patient also developed A. fib with RVR.  He was evaluated by cardiology.  Patient was a started on amiodarone infusion, IV Cardizem.  Not on anticoagulation due to perforation.  Patient developed worsening lactic acidosis, continues to have leukocytosis on 02/07/2018.  Surgery order a CT abdomen and pelvis; thrombosis of the portal vein. Wall thickening of gastric antrum and proximal duodenum is noted with an increased amount of fluid in this area and in the porta hepatis region, most consistent with peptic ulcer disease. There is no free air or pneumoperitoneum is noted at this time.  Dilation of the small bowel concerning with ileus versus a small bowel obstruction.    Assessment & Plan:   Principal Problem:   Perforated peptic ulcer (HCC) Active Problems:   COPD (chronic obstructive pulmonary disease) (HCC)   Elevated troponin   Pleural effusion   HTN (hypertension)  Perforated peptic ulcer/ileus; Patient was admitted to the stepdown unit.  NG tube was placed to suction.  He was a started on IV PPI.  Patient remove NG on 11-28. Patient on clear diet. He developed lactic acidosis, persistent leukocytosis. CT abdomen and pelvis repeated 12;04; showed thrombosis of portal vein, wall thickening of  gastric antrum and proximal duodenum with an increase amount of fluid in this area in the porta hepatis region, most consistent with peptic ulcer disease. Surgery following. Patient currently on IV aztreonam and Flagyl.  Will discuss with ID if either antibiotics option. Discussed with ID, they will see patient in consultation.   Lactic acidosis; Concern with infectious process. Will discuss with ID antibiotics option. IV bolus. Repeat lactic acid this afternoon. Surgery order repeat a CT abdomen and pelvis.  Portal vein thrombosis; will defer to surgery, start anticoagulation.  Discussed with surgery, ok to start anticoagulation. No Bolus.   Hypokalemia; replete IV. Hypomagnesemia replete with IV magnesium.  Metabolic acidosis; improved On sodium bicarb amp IV x4 doses.  Bilateral lower extremity edema; volume overload. Has received PRN IV Lasix.  Thrombocytopenia; From acute illness, consumption. Monitor.  Normalized  Transaminases. Normalize.  Delirium, acute metabolic encephalopathy; Related to acute illness. CT head negative for acute finding. He has required as needed Ativan. Appears improved RN Pressure Injury Documentation:    Malnutrition Type:  Nutrition Problem: Inadequate protein intake Etiology: other (see comment)(current diet order)   Malnutrition Characteristics:  Signs/Symptoms: other (comment)(CLD does not meet estimated protein need)   Nutrition Interventions:  Interventions: Refer to RD note for recommendations  Estimated body mass index is 28.52 kg/m as calculated from the following:   Height as of this encounter: 5\' 9"  (1.753 m).   Weight as of this encounter: 87.6 kg.   DVT prophylaxis: SCDs Code Status: DNR Family Communication: Daughter at bedside Disposition Plan: Remain in the stepdown unit, IV antibiotics, management of  lactic acidosis.  Consultants:   Surgery  Cardiology   Procedures:   None   Antimicrobials:  Aztreonam 12-02  Flagyl 11-28   Subjective: Patient is sitting in bed.  He denies any worsening abdominal pain.  Reports nausea.  Objective: Vitals:   02/07/18 0400 02/07/18 0500 02/07/18 0600 02/07/18 0700  BP: (!) 108/46  (!) 103/44 (!) 112/43  Pulse: (!) 116  (!) 115 (!) 115  Resp: 14  20 15   Temp:      TempSrc:      SpO2: 91%  94% 92%  Weight:  87.6 kg    Height:        Intake/Output Summary (Last 24 hours) at 02/07/2018 0811 Last data filed at 02/06/2018 2000 Gross per 24 hour  Intake 893.18 ml  Output 660 ml  Net 233.18 ml   Filed Weights   02/01/18 0201 02/06/18 0943 02/07/18 0500  Weight: 75.8 kg 87.4 kg 87.6 kg    Examination:  General exam; no acute distress Respiratory system: Decreased breath sounds, crackles at the bases. Cardiovascular system: S1 & S2 regular rhythm and rate Gastrointestinal system: Abdomen obese, bowel sounds present mild tender no rigidity. Central nervous system: Alert and oriented.  Extremities: Symmetric 5 x 5 power. Skin: No rashes, lesions or ulcers Psychiatry: Mood & affect appropriate.     Data Reviewed: I have personally reviewed following labs and imaging studies  CBC: Recent Labs  Lab 01/31/18 1708  02/02/18 0324 02/03/18 0320 02/04/18 0316 02/05/18 0356 02/06/18 0258 02/07/18 0635  WBC 9.9   < > 9.0 10.8* 11.6* 20.1* 19.1* 19.6*  NEUTROABS 9.2*  --  8.0*  --  9.9* 17.3*  --  16.8*  HGB 16.7   < > 13.6 13.0 12.8* 14.5 13.6 14.5  HCT 50.7   < > 41.8 40.0 39.0 44.3 42.0 42.7  MCV 90.7   < > 93.9 94.3 90.9 90.4 91.7 87.0  PLT 180   < > 127* 106* 105* 136* 161 171   < > = values in this interval not displayed.   Basic Metabolic Panel: Recent Labs  Lab 02/01/18 0754  02/03/18 0320 02/04/18 0316 02/05/18 0356 02/06/18 0258 02/07/18 0433  NA 135   < > 134* 134* 137 139 142  K 3.6   < > 4.0 3.5 3.9 4.3 3.2*  CL 104   < > 109 110 110 112* 116*  CO2 23   < > 21* 17* 20* 16* 21*   GLUCOSE 66*   < > 107* 125* 124* 119* 136*  BUN 42*   < > 28* 29* 31* 35* 41*  CREATININE 1.21   < > 0.75 0.90 0.78 0.94 1.05  CALCIUM 7.8*   < > 7.8* 7.7* 8.0* 7.8* 6.5*  MG 2.0  --  2.1 2.1  --  2.1 1.6*   < > = values in this interval not displayed.   GFR: Estimated Creatinine Clearance: 49.2 mL/min (by C-G formula based on SCr of 1.05 mg/dL). Liver Function Tests: Recent Labs  Lab 01/31/18 1708 02/01/18 0754 02/02/18 0324  AST 70* 46* 27  ALT 90* 64* 44  ALKPHOS 102 84 83  BILITOT 1.1 0.9 1.1  PROT 6.2* 5.6* 5.0*  ALBUMIN 3.4* 3.2* 2.6*   Recent Labs  Lab 01/31/18 1708  LIPASE 59*   No results for input(s): AMMONIA in the last 168 hours. Coagulation Profile: Recent Labs  Lab 02/01/18 0754  INR 1.41   Cardiac Enzymes: Recent Labs  Lab 01/31/18  1708 02/01/18 0132 02/01/18 0511 02/01/18 1508  TROPONINI 0.09* 0.09* 0.09* 0.14*   BNP (last 3 results) No results for input(s): PROBNP in the last 8760 hours. HbA1C: No results for input(s): HGBA1C in the last 72 hours. CBG: Recent Labs  Lab 02/07/18 0741  GLUCAP 122*   Lipid Profile: No results for input(s): CHOL, HDL, LDLCALC, TRIG, CHOLHDL, LDLDIRECT in the last 72 hours. Thyroid Function Tests: No results for input(s): TSH, T4TOTAL, FREET4, T3FREE, THYROIDAB in the last 72 hours. Anemia Panel: No results for input(s): VITAMINB12, FOLATE, FERRITIN, TIBC, IRON, RETICCTPCT in the last 72 hours. Sepsis Labs: Recent Labs  Lab 02/02/18 0324 02/04/18 0316 02/05/18 0356 02/07/18 0433  LATICACIDVEN 1.7 1.9 1.9 3.2*    Recent Results (from the past 240 hour(s))  MRSA PCR Screening     Status: None   Collection Time: 02/01/18  3:00 PM  Result Value Ref Range Status   MRSA by PCR NEGATIVE NEGATIVE Final    Comment:        The GeneXpert MRSA Assay (FDA approved for NASAL specimens only), is one component of a comprehensive MRSA colonization surveillance program. It is not intended to diagnose  MRSA infection nor to guide or monitor treatment for MRSA infections. Performed at [facility], [address] [place], [place] [address]   Culture, blood (routine x 2)     Status: None (Preliminary result)   Collection Time: 02/05/18  9:40 AM  Result Value Ref Range Status   Specimen Description   Final    BLOOD RIGHT HAND Performed at [facility], [address], [place], [place] [address]    Special Requests   Final    BOTTLES DRAWN AEROBIC ONLY Blood Culture results may not be optimal due to an inadequate volume of blood received in culture bottles Performed at [facility], [address], [place], [place] [address]    Culture   Final    NO GROWTH 1 DAY Performed at [facility], [address] [place], [place] [address]    Report Status PENDING  Incomplete  Culture, blood (routine x 2)     Status: None (Preliminary result)   Collection Time: 02/05/18  9:40 AM  Result Value Ref Range Status   Specimen Description   Final    BLOOD RIGHT HAND Performed at [facility], [address] [place], [place] [address]    Special Requests   Final    BOTTLES DRAWN AEROBIC ONLY Blood Culture results may not be optimal due to an inadequate volume of blood received in culture bottles Performed at [facility], [address] [place], [place] [address]    Culture   Final    NO GROWTH 1 DAY Performed at [facility], [address], [place], [place] [address]    Report Status PENDING  Incomplete         Radiology Studies: Dg Chest Port 1 View  Result Date: 02/05/2018 CLINICAL DATA:  Hypoxia.  Right pleural effusion.  Emphysema. EXAM: PORTABLE CHEST 1 VIEW COMPARISON:  01/31/2018 FINDINGS: Heart size remains normal. Today's film is lordotically positioned. Question increased density in the lower lobes bilaterally which could go along with some worsened atelectasis.  Background scarring and emphysematous change as seen previously. IMPRESSION: Question worsened atelectasis in both lower lungs. Lordotically position film. Electronically Signed   By: Paulina Fusi M.D.   On: 02/05/2018 09:50   Dg Kayleen Memos  W/kub  Result Date: 02/05/2018 CLINICAL DATA:  Perforated duodenal ulcer, evaluate for sealing EXAM: WATER SOLUBLE UPPER GI SERIES TECHNIQUE: Single-column upper GI series was performed using water soluble contrast. CONTRAST:  Isotonic water-soluble contrast COMPARISON:  Abdominal CT 01/31/2018 FLUOROSCOPY TIME:  Fluoroscopy Time:  3 minutes 6 seconds Radiation Exposure Index (if provided by the fluoroscopic device): 28.1 mGy Number of Acquired Spot Images: 3 FINDINGS: KUB shows a normal bowel gas pattern. The patient had difficulty swallowing recumbent such that took a few swallows via straw sitting up and then was returned recumbent for imaging. The contrast accumulated within the fundus, requiring right lateral decubitus imaging to advance into the antrum. The stomach was moderately distended by gas. Slow passage of contrast through the pylorus, likely due to edema, with no extravasation seen. Duodenal diverticula are present. A discrete ulcer was not identified. Gastroesophageal reflux incidentally noted. IMPRESSION: 1. Negative for gastric or duodenal extravasation. 2. Numerous duodenal diverticula. Electronically Signed   By: Marnee Spring M.D.   On: 02/05/2018 11:22   Dg Swallowing Func-speech Pathology  Result Date: 02/06/2018 Objective Swallowing Evaluation: Type of Study: MBS-Modified Barium Swallow Study  Patient Details Name: LESLIE LANGILLE MRN: [id number] Date of Birth: 02-Oct-1923 Today's Date: 02/06/2018 Time: SLP Start Time (ACUTE ONLY): 1415 -SLP Stop Time (ACUTE ONLY): 1455 SLP Time Calculation (min) (ACUTE ONLY): 40 min Past Medical History: Past Medical History: Diagnosis Date . Allergy  . Anxiety  . Diverticulosis  . History of colon polyps  . History of  hemorrhoids  . History of inguinal herniorrhaphy   right . Hyperlipidemia  . Hypogonadism, male  . Sciatica   right Past Surgical History: Past Surgical History: Procedure Laterality Date . CARPAL TUNNEL RELEASE   . HERNIA REPAIR   HPI: pt is a 84 yo male adm [facility] with abdomen pain- found to have perforated peptic ulcer.   Today pt with increased ATX at lung bases, WBC elevation.  Swallow evaluation ordered.  Subjective: pt awake in chair Assessment / Plan / Recommendation CHL IP CLINICAL IMPRESSIONS 02/06/2018 Clinical Impression Patient presents with moderately severe oropharyngeal dysphagia mostly characterized by weakness.  Pt as only given approximately 9 boluses of barium - thin, nectar and pudding.  Poor oral transiting/bolus cohesion skills due to weakness noted which results in premature spillage and oral residuals.  Pt also extended head upward to aid oral transiting with liquids of increased viscocity.  In addition, pharyngeal swallow is weak resulting in residuals that patient intermittently senses.  Multiple swallows with all consistencies assist to decrease residuals but did not full eliminate them - thus increasing pt's aspiration risk.  Although he did NOT aspirate or penetrate, certain he does experience aspiration due to this level of weakness and residuals.  At this time, would recommend to continue clear liquids via tsp only - with pt swallowing multiple times with each tsp.  NO CUPS/STRAWS nor thicker consistencies.  Pt educated to findings/recommendation but uncertain to his level of comprehension.  Thankful pt is for palliative consult as concerned for his ability to meet nutritional needs with level of dysphagia.   SLP Visit Diagnosis Dysphagia, oropharyngeal phase (R13.12) Attention and concentration deficit following -- Frontal lobe and executive function deficit following -- Impact on safety and function Severe aspiration risk;Risk for inadequate nutrition/hydration;Moderate aspiration  risk   CHL IP TREATMENT RECOMMENDATION 02/06/2018 Treatment Recommendations Therapy as outlined in treatment plan below   Prognosis 02/06/2018 Prognosis for Safe Diet Advancement Guarded Barriers to Reach Goals --  Barriers/Prognosis Comment -- CHL IP DIET RECOMMENDATION 02/06/2018 SLP Diet Recommendations Thin liquid Liquid Administration via No straw;Spoon Medication Administration Other (Comment) Compensations Small sips/bites;Slow rate;Multiple dry swallows after each bite/sip Postural Changes Remain semi-upright after after feeds/meals (Comment);Seated upright at 90 degrees   CHL IP OTHER RECOMMENDATIONS 02/06/2018 Recommended Consults -- Oral Care Recommendations Oral care QID Other Recommendations Have oral suction available   CHL IP FOLLOW UP RECOMMENDATIONS 02/06/2018 Follow up Recommendations (No Data)   CHL IP FREQUENCY AND DURATION 02/06/2018 Speech Therapy Frequency (ACUTE ONLY) min 1 x/week Treatment Duration 1 week      CHL IP ORAL PHASE 02/06/2018 Oral Phase Impaired Oral - Pudding Teaspoon -- Oral - Pudding Cup -- Oral - Honey Teaspoon -- Oral - Honey Cup -- Oral - Nectar Teaspoon -- Oral - Nectar Cup Weak lingual manipulation;Delayed oral transit;Decreased bolus cohesion;Reduced posterior propulsion Oral - Nectar Straw Weak lingual manipulation;Delayed oral transit;Decreased bolus cohesion;Reduced posterior propulsion Oral - Thin Teaspoon Weak lingual manipulation;Delayed oral transit;Decreased bolus cohesion;Reduced posterior propulsion Oral - Thin Cup Weak lingual manipulation;Delayed oral transit;Decreased bolus cohesion;Reduced posterior propulsion Oral - Thin Straw Weak lingual manipulation;Delayed oral transit;Decreased bolus cohesion;Reduced posterior propulsion Oral - Puree Weak lingual manipulation;Delayed oral transit;Decreased bolus cohesion;Reduced posterior propulsion;Holding of bolus Oral - Mech Soft -- Oral - Regular -- Oral - Multi-Consistency -- Oral - Pill -- Oral Phase - Comment pt  extended head upward to aid oral transiting of boluses, excessively delayed with pudding - needing liquids to transit piecemealed portion of bolus  CHL IP PHARYNGEAL PHASE 02/06/2018 Pharyngeal Phase Impaired Pharyngeal- Pudding Teaspoon -- Pharyngeal -- Pharyngeal- Pudding Cup -- Pharyngeal -- Pharyngeal- Honey Teaspoon -- Pharyngeal -- Pharyngeal- Honey Cup -- Pharyngeal -- Pharyngeal- Nectar Teaspoon Pharyngeal residue - valleculae;Pharyngeal residue - pyriform;Reduced tongue base retraction;Reduced epiglottic inversion;Reduced pharyngeal peristalsis;Reduced laryngeal elevation;Reduced airway/laryngeal closure Pharyngeal -- Pharyngeal- Nectar Cup -- Pharyngeal -- Pharyngeal- Nectar Straw Pharyngeal residue - valleculae;Pharyngeal residue - pyriform;Reduced pharyngeal peristalsis;Reduced epiglottic inversion;Reduced tongue base retraction;Reduced laryngeal elevation;Reduced airway/laryngeal closure Pharyngeal -- Pharyngeal- Thin Teaspoon Pharyngeal residue - valleculae;Pharyngeal residue - pyriform;Reduced pharyngeal peristalsis;Reduced epiglottic inversion;Reduced tongue base retraction;Reduced laryngeal elevation;Reduced airway/laryngeal closure Pharyngeal -- Pharyngeal- Thin Cup Pharyngeal residue - valleculae;Pharyngeal residue - pyriform;Reduced pharyngeal peristalsis;Reduced epiglottic inversion;Reduced tongue base retraction;Reduced laryngeal elevation;Reduced airway/laryngeal closure Pharyngeal -- Pharyngeal- Thin Straw Pharyngeal residue - valleculae;Pharyngeal residue - pyriform;Reduced pharyngeal peristalsis;Reduced epiglottic inversion;Reduced tongue base retraction;Reduced airway/laryngeal closure;Reduced laryngeal elevation Pharyngeal -- Pharyngeal- Puree Pharyngeal residue - valleculae;Pharyngeal residue - pyriform;Reduced epiglottic inversion;Reduced pharyngeal peristalsis;Reduced tongue base retraction;Reduced laryngeal elevation;Reduced airway/laryngeal closure Pharyngeal -- Pharyngeal- Mechanical  Soft -- Pharyngeal -- Pharyngeal- Regular -- Pharyngeal -- Pharyngeal- Multi-consistency -- Pharyngeal -- Pharyngeal- Pill -- Pharyngeal -- Pharyngeal Comment pt does not sense residuals, multiple swallows decrease pharyngeal resdiuals but do not fully clear them,  he is at aspiration due to residuals despite not aspirating  CHL IP CERVICAL ESOPHAGEAL PHASE 02/06/2018 Cervical Esophageal Phase Impaired Pudding Teaspoon -- Pudding Cup -- Honey Teaspoon -- Honey Cup -- Nectar Teaspoon -- Nectar Cup -- Nectar Straw -- Thin Teaspoon -- Thin Cup -- Thin Straw -- Puree -- Mechanical Soft -- Regular -- Multi-consistency -- Pill -- Cervical Esophageal Comment -- Chales Abrahams 02/06/2018, 3:00 PM  Donavan Burnet, MS [facility] SLP Acute Rehab Services Pager [phone] Office [phone]                  Scheduled Meds: . albuterol  2.5 mg Nebulization BID  . mouth rinse  15 mL Mouth Rinse BID  .  pantoprazole  40 mg Intravenous Q12H  . sodium bicarbonate  50 mEq Intravenous BID  . sucralfate  1 g Oral Q6H   Continuous Infusions: . amiodarone 30 mg/hr (02/07/18 0333)  . aztreonam Stopped (02/07/18 0724)  . diltiazem (CARDIZEM) infusion 10 mg/hr (02/07/18 0243)  . magnesium sulfate 1 - 4 g bolus IVPB 2 g (02/07/18 0800)  . metronidazole Stopped (02/07/18 1308)  . potassium chloride    . sodium chloride 500 mL (02/07/18 0646)     LOS: 7 days    Time spent: 35 minutes.     Alba Cory, MD [facility] Pager [phone]  If 7PM-7AM, please contact night-coverage [email or web address] Password [email or web address] 02/07/2018, 8:11 AM

## 2018-02-07 NOTE — Progress Notes (Signed)
NUTRITION NOTE  Patient seen for full assessment by this RD on 12/3. Reviewed SLP notes from yesterday afternoon which outline recommendation for clear liquids via teaspoon only. Will order Boost Breeze TID, each supplement provides 250 kcal and 9 grams of protein--to be provided via teaspoon only as well.  RD will continue to follow per protocol.    Trenton GammonJessica Auburn Hert, MS, RD, LDN, [facility] Inpatient Clinical Dietitian Pager # [phone] After hours/weekend pager # [phone]

## 2018-02-07 NOTE — Evaluation (Signed)
Occupational Therapy Evaluation Patient Details Name: Manuel Reilly MRN: [id number] DOB: [birth year] Today's Date: 02/07/2018    History of Present Illness THis 84 year old man was admitted with abdominal pain and found to have perforated peptic ulcer.   Clinical Impression   Pt was admitted for the above. At baseline, he is independent and drives. He presents with bil UE edema and generalized weakness. He will benefit from continued OT to increase safety and independence with adls. Initial goals for OT will focus on UE strengthening and mobility related to ADLs until arms regain adequate strength and edema is controlled to promote function    Follow Up Recommendations  SNF    Equipment Recommendations  (to be further assessed)    Recommendations for Other Services       Precautions / Restrictions Precautions Precautions: Fall Precaution Comments: oozing fluid from arms Restrictions Weight Bearing Restrictions: No      Mobility Bed Mobility Overal bed mobility: Needs Assistance             General bed mobility comments: total +2 assist for all bed mobility. Pt 10%  Transfers Overall transfer level: Needs assistance   Transfers: Sit to/from Stand Sit to Stand: Max assist;+2 physical assistance;From elevated surface         General transfer comment: pt with posterior lean. Assist to rise and stabilize with hand held assist of 2    Balance Overall balance assessment: Needs assistance   Sitting balance-Leahy Scale: Fair     Standing balance support: Bilateral upper extremity supported Standing balance-Leahy Scale: Poor                             ADL either performed or assessed with clinical judgement   ADL Overall ADL's : Needs assistance/impaired                                       General ADL Comments: pt needs total A for all adls; UE weakness/edema/limited ROM.  He was able to sit unsupported EOB without physical  assistance once assisted with +2.     Vision         Perception     Praxis      Pertinent Vitals/Pain Faces Pain Scale: Hurts even more Pain Location: all 4 extremities Pain Descriptors / Indicators: Grimacing;Moaning     Hand Dominance     Extremity/Trunk Assessment Upper Extremity Assessment Upper Extremity Assessment: Generalized weakness           Communication Communication Communication: (difficult to understand)   Cognition Arousal/Alertness: Awake/alert Behavior During Therapy: WFL for tasks assessed/performed                                   General Comments: delayed processing at times   General Comments  positioned bil UEs for edema management and encouraged moving fingers.  R hand with more movement than L    Exercises     Shoulder Instructions      Home Living Family/patient expects to be discharged to:: Unsure Living Arrangements: Alone                                      Prior  Functioning/Environment Level of Independence: Independent        Comments: drove        OT Problem List: Decreased strength;Decreased range of motion;Decreased activity tolerance;Impaired balance (sitting and/or standing);Decreased cognition;Increased edema;Impaired UE functional use;Pain      OT Treatment/Interventions: Self-care/ADL training;Therapeutic exercise;Energy conservation;DME and/or AE instruction;Therapeutic activities;Cognitive remediation/compensation;Patient/family education;Balance training    OT Goals(Current goals can be found in the care plan section) Acute Rehab OT Goals Patient Stated Goal: none stated; wanted to get off back OT Goal Formulation: With patient/family Time For Goal Achievement: 02/21/18 Potential to Achieve Goals: Fair ADL Goals Pt Will Transfer to Toilet: with mod assist;with +2 assist;stand pivot transfer;bedside commode Additional ADL Goal #1: pt will participate in AAROM to bil UEs 10  reps x 3 motions to increase strength for adls Additional ADL Goal #2: pt will perform bed mobility with mod +2 in preparation for adls  OT Frequency: Min 2X/week   Barriers to D/C:            Co-evaluation PT/OT/SLP Co-Evaluation/Treatment: Yes Reason for Co-Treatment: For patient/therapist safety PT goals addressed during session: Mobility/safety with mobility OT goals addressed during session: Strengthening/ROM      AM-PAC OT "6 Clicks" Daily Activity     Outcome Measure Help from another person eating meals?: Total Help from another person taking care of personal grooming?: Total Help from another person toileting, which includes using toliet, bedpan, or urinal?: Total Help from another person bathing (including washing, rinsing, drying)?: Total Help from another person to put on and taking off regular upper body clothing?: Total Help from another person to put on and taking off regular lower body clothing?: Total 6 Click Score: 6   End of Session    Activity Tolerance: Patient tolerated treatment well Patient left: in bed;with call bell/phone within reach;with bed alarm set;with family/visitor present(pt needed to prep for CT)  OT Visit Diagnosis: Unsteadiness on feet (R26.81);Muscle weakness (generalized) (M62.81)                Time: 9604-5409 OT Time Calculation (min): 35 min Charges:  OT General Charges $OT Visit: 1 Visit OT Evaluation $OT Eval Moderate Complexity: 1 Mod  Marica Otter, OTR/L Acute Rehabilitation Services [phone] [facility] pager [phone] office 02/07/2018  Manuel Reilly 02/07/2018, 12:32 PM

## 2018-02-07 NOTE — Progress Notes (Signed)
CRITICAL VALUE ALERT  Critical Value:  Lactic Acid 2.6  Date & Time Notied:  02/07/18 1420  Provider Notified: Dr.Regalado  Orders Received/Actions taken: Aware, placing order for 250ml bolus

## 2018-02-07 NOTE — Progress Notes (Signed)
CRITICAL VALUE ALERT  Critical Value:  Lactic Acid 2.6  Date & Time Notied:  02/07/18 1650  Provider Notified: Dr. Sunnie Nielsenegalado  Orders Received/Actions taken: made aware

## 2018-02-07 NOTE — Progress Notes (Signed)
Progress Note  Patient Name: Manuel Reilly Date of Encounter: 02/07/2018  Primary Cardiologist: Donato Schultz, MD   Subjective   Awake   In bed      Inpatient Medications    Scheduled Meds: . albuterol  2.5 mg Nebulization BID  . feeding supplement  1 Container Oral TID BM  . mouth rinse  15 mL Mouth Rinse BID  . pantoprazole  40 mg Intravenous Q12H  . sodium bicarbonate  50 mEq Intravenous BID  . sodium chloride (PF)      . sucralfate  1 g Oral Q6H   Continuous Infusions: . amiodarone 30 mg/hr (02/07/18 1000)  . aztreonam 1 g (02/07/18 1353)  . diltiazem (CARDIZEM) infusion 10 mg/hr (02/07/18 1349)  . metronidazole Stopped (02/07/18 0651)   PRN Meds: acetaminophen **OR** acetaminophen, ALPRAZolam, iohexol, ipratropium-albuterol, morphine injection, ondansetron (ZOFRAN) IV   Vital Signs    Vitals:   02/07/18 1000 02/07/18 1100 02/07/18 1200 02/07/18 1300  BP: (!) 113/40 (!) 113/50 (!) 107/50 (!) 98/45  Pulse: (!) 119 (!) 117 (!) 114 (!) 117  Resp: 17 16 20 20   Temp:   97.7 F (36.5 C)   TempSrc:   Axillary   SpO2: 93% 93% 93% 94%  Weight:      Height:        Intake/Output Summary (Last 24 hours) at 02/07/2018 1418 Last data filed at 02/07/2018 1000 Gross per 24 hour  Intake 2036.47 ml  Output 560 ml  Net 1476.47 ml    Net  +  13 L  Filed Weights   02/01/18 0201 02/06/18 0943 02/07/18 0500  Weight: 75.8 kg 87.4 kg 87.6 kg    Telemetry    Afib/ aflutter   100s    Personally Reviewed  ECG    Possible atypcial atrial flutter   With 2:1 AV conduction  RBBB- Personally Reviewed  Physical Exam   GEN:  Awake  Sitting in chair   Neck: JVP is inncreased Cardiac:  RRR  tachycardic, no murmurs, rubs, or gallops.  Respiratory:= Rhonchi   GI: Sl distended   + BS   MS: 2+ edema   Skin weeping  No deformity. Neuro:  Nonfocal    Labs    Chemistry Recent Labs  Lab 01/31/18 1708 02/01/18 0754 02/02/18 0324  02/05/18 0356 02/06/18 0258  02/07/18 0433  NA 133* 135 134*   < > 137 139 142  K 3.6 3.6 3.6   < > 3.9 4.3 3.2*  CL 98 104 105   < > 110 112* 116*  CO2 21* 23 20*   < > 20* 16* 21*  GLUCOSE 73 66* 116*   < > 124* 119* 136*  BUN 38* 42* 32*   < > 31* 35* 41*  CREATININE 1.20 1.21 0.89   < > 0.78 0.94 1.05  CALCIUM 8.3* 7.8* 7.7*   < > 8.0* 7.8* 6.5*  PROT 6.2* 5.6* 5.0*  --   --   --   --   ALBUMIN 3.4* 3.2* 2.6*  --   --   --   --   AST 70* 46* 27  --   --   --   --   ALT 90* 64* 44  --   --   --   --   ALKPHOS 102 84 83  --   --   --   --   BILITOT 1.1 0.9 1.1  --   --   --   --  GFRNONAA 52* 52* >60   < > >60 >60 >60  GFRAA >60 60* >60   < > >60 >60 >60  ANIONGAP 14 8 9    < > 7 11 5    < > = values in this interval not displayed.     Hematology Recent Labs  Lab 02/05/18 0356 02/06/18 0258 02/07/18 0635  WBC 20.1* 19.1* 19.6*  RBC 4.90 4.58 4.91  HGB 14.5 13.6 14.5  HCT 44.3 42.0 42.7  MCV 90.4 91.7 87.0  MCH 29.6 29.7 29.5  MCHC 32.7 32.4 34.0  RDW 15.8* 16.3* 16.1*  PLT 136* 161 171    Cardiac Enzymes Recent Labs  Lab 01/31/18 1708 02/01/18 0132 02/01/18 0511 02/01/18 1508  TROPONINI 0.09* 0.09* 0.09* 0.14*   No results for input(s): TROPIPOC in the last 168 hours.   BNPNo results for input(s): BNP, PROBNP in the last 168 hours.   DDimer No results for input(s): DDIMER in the last 168 hours.   Radiology    Ct Abdomen Pelvis W Contrast  Result Date: 02/07/2018 CLINICAL DATA:  History of perforated ulcer. EXAM: CT ABDOMEN AND PELVIS WITH CONTRAST TECHNIQUE: Multidetector CT imaging of the abdomen and pelvis was performed using the standard protocol following bolus administration of intravenous contrast. CONTRAST:  OMNIPAQUE IOHEXOL 300 MG/ML  SOLN COMPARISON:  CT scan of January 31, 2018. FINDINGS: Lower chest: Moderate loculated pleural effusions are noted bilaterally with adjacent atelectasis. Hepatobiliary: Gallbladder is dilated. No definite gallstones are noted. Stable left  hepatic cyst is noted. 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 Increased amount of fluid is noted in the porta hepatis region. Pancreas: Fatty replacement of the pancreas is again noted. No definite inflammation or ductal dilatation is noted. Spleen: Normal in size without focal abnormality. Adrenals/Urinary Tract: Adrenal glands appear normal. Stable right renal cyst is noted. No hydronephrosis or renal obstruction is noted. No renal or ureteral calculi are noted. Urinary bladder is decompressed secondary to Foley catheter. Stomach/Bowel: Stable gastric distention is noted. Wall thickening is seen involving the gastric antrum and proximal duodenum with surrounding fluid concerning for peptic ulcer disease. No free air is noted at this time. Dilated small bowel loops are noted concerning for ileus or distal small bowel obstruction. Diverticulosis of proximal sigmoid colon is noted. There is no evidence of colonic dilatation. The appendix is not clearly visualized. Vascular/Lymphatic: Aortic atherosclerosis. No enlarged abdominal or pelvic lymph nodes. Reproductive: Mild prostatic calcifications are noted. Other: Mild anasarca is noted. Fat containing left inguinal hernia is noted. Musculoskeletal: No acute or significant osseous findings. IMPRESSION: Thrombosis of main portal vein is noted. These results will be called to the ordering clinician or representative by the Radiologist Assistant, and communication documented in the PACS or zVision Dashboard. Wall thickening of gastric antrum and proximal duodenum is noted with an increased amount of fluid in this area and in the porta hepatis region, most consistent with peptic ulcer disease. There is no free air or pneumoperitoneum is noted at this time. Small bowel dilatation is noted concerning for ileus or distal small bowel obstruction. Fatty replacement of the pancreas is again noted. Mild anasarca. Sigmoid diverticulosis without inflammation. Moderate  bilateral loculated pleural effusions are noted with adjacent atelectasis. Aortic Atherosclerosis (ICD10-I70.0). Electronically Signed   By: Lupita Raider, M.D.   On: 02/07/2018 12:24   Dg Swallowing Func-speech Pathology  Result Date: 02/06/2018 Objective Swallowing Evaluation: Type of Study: MBS-Modified Barium Swallow Study  Patient Details Name: Bernice Mullin  [place] MRN: [id number] Date of Birth: [birth year] Today's Date: 02/06/2018 Time: SLP Start Time (ACUTE ONLY): 1415 -SLP Stop Time (ACUTE ONLY): 1455 SLP Time Calculation (min) (ACUTE ONLY): 40 min Past Medical History: Past Medical History: Diagnosis Date . Allergy  . Anxiety  . Diverticulosis  . History of colon polyps  . History of hemorrhoids  . History of inguinal herniorrhaphy   right . Hyperlipidemia  . Hypogonadism, male  . Sciatica   right Past Surgical History: Past Surgical History: Procedure Laterality Date . CARPAL TUNNEL RELEASE   . HERNIA REPAIR   HPI: pt is a 84 yo male adm to [facility] with abdomen pain- found to have perforated peptic ulcer.   Today pt with increased ATX at lung bases, WBC elevation.  Swallow evaluation ordered.  Subjective: pt awake in chair Assessment / Plan / Recommendation CHL IP CLINICAL IMPRESSIONS 02/06/2018 Clinical Impression Patient presents with moderately severe oropharyngeal dysphagia mostly characterized by weakness.  Pt as only given approximately 9 boluses of barium - thin, nectar and pudding.  Poor oral transiting/bolus cohesion skills due to weakness noted which results in premature spillage and oral residuals.  Pt also extended head upward to aid oral transiting with liquids of increased viscocity.  In addition, pharyngeal swallow is weak resulting in residuals that patient intermittently senses.  Multiple swallows with all consistencies assist to decrease residuals but did not full eliminate them - thus increasing pt's aspiration risk.  Although he did NOT aspirate or penetrate, certain he does experience  aspiration due to this level of weakness and residuals.  At this time, would recommend to continue clear liquids via tsp only - with pt swallowing multiple times with each tsp.  NO CUPS/STRAWS nor thicker consistencies.  Pt educated to findings/recommendation but uncertain to his level of comprehension.  Thankful pt is for palliative consult as concerned for his ability to meet nutritional needs with level of dysphagia.   SLP Visit Diagnosis Dysphagia, oropharyngeal phase (R13.12) Attention and concentration deficit following -- Frontal lobe and executive function deficit following -- Impact on safety and function Severe aspiration risk;Risk for inadequate nutrition/hydration;Moderate aspiration risk   CHL IP TREATMENT RECOMMENDATION 02/06/2018 Treatment Recommendations Therapy as outlined in treatment plan below   Prognosis 02/06/2018 Prognosis for Safe Diet Advancement Guarded Barriers to Reach Goals -- Barriers/Prognosis Comment -- CHL IP DIET RECOMMENDATION 02/06/2018 SLP Diet Recommendations Thin liquid Liquid Administration via No straw;Spoon Medication Administration Other (Comment) Compensations Small sips/bites;Slow rate;Multiple dry swallows after each bite/sip Postural Changes Remain semi-upright after after feeds/meals (Comment);Seated upright at 90 degrees   CHL IP OTHER RECOMMENDATIONS 02/06/2018 Recommended Consults -- Oral Care Recommendations Oral care QID Other Recommendations Have oral suction available   CHL IP FOLLOW UP RECOMMENDATIONS 02/06/2018 Follow up Recommendations (No Data)   CHL IP FREQUENCY AND DURATION 02/06/2018 Speech Therapy Frequency (ACUTE ONLY) min 1 x/week Treatment Duration 1 week      CHL IP ORAL PHASE 02/06/2018 Oral Phase Impaired Oral - Pudding Teaspoon -- Oral - Pudding Cup -- Oral - Honey Teaspoon -- Oral - Honey Cup -- Oral - Nectar Teaspoon -- Oral - Nectar Cup Weak lingual manipulation;Delayed oral transit;Decreased bolus cohesion;Reduced posterior propulsion Oral - Nectar  Straw Weak lingual manipulation;Delayed oral transit;Decreased bolus cohesion;Reduced posterior propulsion Oral - Thin Teaspoon Weak lingual manipulation;Delayed oral transit;Decreased bolus cohesion;Reduced posterior propulsion Oral - Thin Cup Weak lingual manipulation;Delayed oral transit;Decreased bolus cohesion;Reduced posterior propulsion Oral - Thin Straw Weak lingual manipulation;Delayed oral transit;Decreased bolus cohesion;Reduced posterior propulsion Oral -  Puree Weak lingual manipulation;Delayed oral transit;Decreased bolus cohesion;Reduced posterior propulsion;Holding of bolus Oral - Mech Soft -- Oral - Regular -- Oral - Multi-Consistency -- Oral - Pill -- Oral Phase - Comment pt extended head upward to aid oral transiting of boluses, excessively delayed with pudding - needing liquids to transit piecemealed portion of bolus  CHL IP PHARYNGEAL PHASE 02/06/2018 Pharyngeal Phase Impaired Pharyngeal- Pudding Teaspoon -- Pharyngeal -- Pharyngeal- Pudding Cup -- Pharyngeal -- Pharyngeal- Honey Teaspoon -- Pharyngeal -- Pharyngeal- Honey Cup -- Pharyngeal -- Pharyngeal- Nectar Teaspoon Pharyngeal residue - valleculae;Pharyngeal residue - pyriform;Reduced tongue base retraction;Reduced epiglottic inversion;Reduced pharyngeal peristalsis;Reduced laryngeal elevation;Reduced airway/laryngeal closure Pharyngeal -- Pharyngeal- Nectar Cup -- Pharyngeal -- Pharyngeal- Nectar Straw Pharyngeal residue - valleculae;Pharyngeal residue - pyriform;Reduced pharyngeal peristalsis;Reduced epiglottic inversion;Reduced tongue base retraction;Reduced laryngeal elevation;Reduced airway/laryngeal closure Pharyngeal -- Pharyngeal- Thin Teaspoon Pharyngeal residue - valleculae;Pharyngeal residue - pyriform;Reduced pharyngeal peristalsis;Reduced epiglottic inversion;Reduced tongue base retraction;Reduced laryngeal elevation;Reduced airway/laryngeal closure Pharyngeal -- Pharyngeal- Thin Cup Pharyngeal residue - valleculae;Pharyngeal  residue - pyriform;Reduced pharyngeal peristalsis;Reduced epiglottic inversion;Reduced tongue base retraction;Reduced laryngeal elevation;Reduced airway/laryngeal closure Pharyngeal -- Pharyngeal- Thin Straw Pharyngeal residue - valleculae;Pharyngeal residue - pyriform;Reduced pharyngeal peristalsis;Reduced epiglottic inversion;Reduced tongue base retraction;Reduced airway/laryngeal closure;Reduced laryngeal elevation Pharyngeal -- Pharyngeal- Puree Pharyngeal residue - valleculae;Pharyngeal residue - pyriform;Reduced epiglottic inversion;Reduced pharyngeal peristalsis;Reduced tongue base retraction;Reduced laryngeal elevation;Reduced airway/laryngeal closure Pharyngeal -- Pharyngeal- Mechanical Soft -- Pharyngeal -- Pharyngeal- Regular -- Pharyngeal -- Pharyngeal- Multi-consistency -- Pharyngeal -- Pharyngeal- Pill -- Pharyngeal -- Pharyngeal Comment pt does not sense residuals, multiple swallows decrease pharyngeal resdiuals but do not fully clear them,  he is at aspiration due to residuals despite not aspirating  CHL IP CERVICAL ESOPHAGEAL PHASE 02/06/2018 Cervical Esophageal Phase Impaired Pudding Teaspoon -- Pudding Cup -- Honey Teaspoon -- Honey Cup -- Nectar Teaspoon -- Nectar Cup -- Nectar Straw -- Thin Teaspoon -- Thin Cup -- Thin Straw -- Puree -- Mechanical Soft -- Regular -- Multi-consistency -- Pill -- Cervical Esophageal Comment -- [name], Tamara Ann 02/06/2018, 3:00 PM  Donavan Burnetamara Kimball, MS [facility] SLP Acute Rehab Services Pager [phone] Office [phone]              Cardiac Studies   Echo unable to visualize  Patient Profile     84 y.o. male with new onset persistent atrial fibrillation in the setting of perforated peptic ulcer with mildly elevated troponin, right bundle branch block left anterior fascicular block now with delirium  Assessment & Plan    Rhythm.    Remains in Afib / aflutter  Rates in low 100s    On diltiazem and IV diltiazem   (not taking oral fluids well) COntinue     Not on anticoagulation   Perforated peptic ulcer Surgery following    Anasarca   Will dose with BID lasix   Echo  Unable to visualize heart    Elevated troponin -Demand ischemia low level flat.  DNR.    For questions or updates, please contact [facility] Please consult [email or web address] for contact info under        Signed, Dietrich PatesPaula Naziyah Tieszen, MD  02/07/2018, 2:18 PM

## 2018-02-08 ENCOUNTER — Inpatient Hospital Stay (HOSPITAL_COMMUNITY): Payer: Medicare Other

## 2018-02-08 ENCOUNTER — Other Ambulatory Visit (HOSPITAL_COMMUNITY): Payer: Medicare Other

## 2018-02-08 DIAGNOSIS — Z7189 Other specified counseling: Secondary | ICD-10-CM

## 2018-02-08 DIAGNOSIS — Z515 Encounter for palliative care: Secondary | ICD-10-CM

## 2018-02-08 DIAGNOSIS — K265 Chronic or unspecified duodenal ulcer with perforation: Secondary | ICD-10-CM

## 2018-02-08 DIAGNOSIS — R1311 Dysphagia, oral phase: Secondary | ICD-10-CM

## 2018-02-08 LAB — CBC
HCT: 44.4 % (ref 39.0–52.0)
Hemoglobin: 14.6 g/dL (ref 13.0–17.0)
MCH: 29.7 pg (ref 26.0–34.0)
MCHC: 32.9 g/dL (ref 30.0–36.0)
MCV: 90.4 fL (ref 80.0–100.0)
NRBC: 0.1 % (ref 0.0–0.2)
PLATELETS: 193 10*3/uL (ref 150–400)
RBC: 4.91 MIL/uL (ref 4.22–5.81)
RDW: 17.2 % — AB (ref 11.5–15.5)
WBC: 22.1 10*3/uL — ABNORMAL HIGH (ref 4.0–10.5)

## 2018-02-08 LAB — MAGNESIUM: Magnesium: 2 mg/dL (ref 1.7–2.4)

## 2018-02-08 LAB — BASIC METABOLIC PANEL
Anion gap: 7 (ref 5–15)
BUN: 40 mg/dL — ABNORMAL HIGH (ref 8–23)
CO2: 19 mmol/L — AB (ref 22–32)
Calcium: 7 mg/dL — ABNORMAL LOW (ref 8.9–10.3)
Chloride: 115 mmol/L — ABNORMAL HIGH (ref 98–111)
Creatinine, Ser: 0.98 mg/dL (ref 0.61–1.24)
GFR calc Af Amer: >60 mL/min (ref >60)
GFR calc non Af Amer: >60 mL/min (ref >60)
Glucose, Bld: 142 mg/dL — ABNORMAL HIGH (ref 70–99)
Potassium: 3.5 mmol/L (ref 3.5–5.1)
Sodium: 141 mmol/L (ref 135–145)

## 2018-02-08 LAB — PREALBUMIN: Prealbumin: <5 mg/dL — ABNORMAL LOW (ref 18–38)

## 2018-02-08 LAB — LACTIC ACID, PLASMA
Lactic Acid, Venous: 2.9 mmol/L (ref 0.5–1.9)
Lactic Acid, Venous: 2.4 mmol/L (ref 0.5–1.9)

## 2018-02-08 LAB — HEPARIN LEVEL (UNFRACTIONATED)
Heparin Unfractionated: <0.1 IU/mL — ABNORMAL LOW (ref 0.30–0.70)
Heparin Unfractionated: <0.1 IU/mL — ABNORMAL LOW (ref 0.30–0.70)

## 2018-02-08 MED ORDER — ALBUTEROL SULFATE (2.5 MG/3ML) 0.083% IN NEBU
2.5000 mg | INHALATION_SOLUTION | RESPIRATORY_TRACT | Status: DC | PRN
  Administered 2018-02-09: 2.5 mg via RESPIRATORY_TRACT
  Filled 2018-02-08: qty 3

## 2018-02-08 MED ORDER — FUROSEMIDE 10 MG/ML IJ SOLN
80.0000 mg | Freq: Once | INTRAMUSCULAR | Status: AC
  Administered 2018-02-08: 80 mg via INTRAVENOUS
  Filled 2018-02-08: qty 8

## 2018-02-08 MED ORDER — SODIUM CHLORIDE 0.9 % IV BOLUS
500.0000 mL | Freq: Once | INTRAVENOUS | Status: AC
  Administered 2018-02-09: 500 mL via INTRAVENOUS

## 2018-02-08 MED ORDER — POTASSIUM CHLORIDE 20 MEQ/15ML (10%) PO SOLN
40.0000 meq | Freq: Once | ORAL | Status: AC
  Administered 2018-02-08: 40 meq via ORAL
  Filled 2018-02-08: qty 30

## 2018-02-08 MED ORDER — HEPARIN (PORCINE) 25000 UT/250ML-% IV SOLN
1450.0000 [IU]/h | INTRAVENOUS | Status: DC
  Administered 2018-02-08 (×2): 1300 [IU]/h via INTRAVENOUS
  Administered 2018-02-09: 1450 [IU]/h via INTRAVENOUS
  Filled 2018-02-08 (×2): qty 250

## 2018-02-08 MED ORDER — FLUCONAZOLE 100MG IVPB
100.0000 mg | INTRAVENOUS | Status: DC
  Administered 2018-02-08 – 2018-02-10 (×3): 100 mg via INTRAVENOUS
  Filled 2018-02-08 (×3): qty 50

## 2018-02-08 MED ORDER — SODIUM CHLORIDE 0.9 % IV BOLUS
500.0000 mL | Freq: Once | INTRAVENOUS | Status: AC
  Administered 2018-02-08: 500 mL via INTRAVENOUS

## 2018-02-08 MED ORDER — IPRATROPIUM-ALBUTEROL 0.5-2.5 (3) MG/3ML IN SOLN
3.0000 mL | Freq: Four times a day (QID) | RESPIRATORY_TRACT | Status: DC
  Administered 2018-02-09 – 2018-02-10 (×8): 3 mL via RESPIRATORY_TRACT
  Filled 2018-02-08 (×8): qty 3

## 2018-02-08 MED ORDER — POTASSIUM CHLORIDE CRYS ER 20 MEQ PO TBCR: 40.0000 meq | EXTENDED_RELEASE_TABLET | Freq: Once | ORAL | Status: DC

## 2018-02-08 NOTE — Progress Notes (Signed)
Progress Note  Patient Name: Manuel Reilly Date of Encounter: 02/08/2018  Primary Cardiologist: Donato Schultz, MD   Subjective   Awake   In bed      Inpatient Medications    Scheduled Meds: . albuterol  2.5 mg Nebulization BID  . feeding supplement  1 Container Oral TID BM  . mouth rinse  15 mL Mouth Rinse BID  . pantoprazole  40 mg Intravenous Q12H  . sucralfate  1 g Oral Q6H   Continuous Infusions: . amiodarone 30 mg/hr (02/08/18 1008)  . diltiazem (CARDIZEM) infusion 5 mg/hr (02/08/18 1008)  . fluconazole (DIFLUCAN) IV Stopped (02/08/18 1249)  . heparin 1,300 Units/hr (02/08/18 1252)  . meropenem (MERREM) IV Stopped (02/08/18 1038)   PRN Meds: acetaminophen **OR** acetaminophen, ALPRAZolam, iohexol, ipratropium-albuterol, morphine injection, ondansetron (ZOFRAN) IV   Vital Signs    Vitals:   02/08/18 0800 02/08/18 0900 02/08/18 1000 02/08/18 1200  BP: (!) 95/39 (!) 113/44 (!) 106/48   Pulse:  (!) 116 (!) 115   Resp: 17 (!) 21 (!) 24   Temp:    97.6 F (36.4 C)  TempSrc:    Oral  SpO2: 98% 96% 97%   Weight:      Height:        Intake/Output Summary (Last 24 hours) at 02/08/2018 1344 Last data filed at 02/08/2018 1008 Gross per 24 hour  Intake 2921.51 ml  Output 1140 ml  Net 1781.51 ml    Net  +  13 L  Filed Weights   02/01/18 0201 02/06/18 0943 02/07/18 0500  Weight: 75.8 kg 87.4 kg 87.6 kg    Telemetry    Afib/ aflutter   100s    Personally Reviewed  ECG      Physical Exam   GEN:  Awake  Sitting in bed  Neck: JVP is inncreased Cardiac:  RRR  tachycardic, no murmurs, rubs, or gallops.  Respiratory:= Rhonchi   GI: Sl distended   + BS   MS: 1+ edema   Skin weeping  No deformity. Neuro:  Nonfocal    Labs    Chemistry Recent Labs  Lab 02/02/18 0324  02/06/18 0258 02/07/18 0433 02/08/18 0108  NA 134*   < > 139 142 141  K 3.6   < > 4.3 3.2* 3.5  CL 105   < > 112* 116* 115*  CO2 20*   < > 16* 21* 19*  GLUCOSE 116*   < > 119* 136*  142*  BUN 32*   < > 35* 41* 40*  CREATININE 0.89   < > 0.94 1.05 0.98  CALCIUM 7.7*   < > 7.8* 6.5* 7.0*  PROT 5.0*  --   --   --   --   ALBUMIN 2.6*  --   --   --   --   AST 27  --   --   --   --   ALT 44  --   --   --   --   ALKPHOS 83  --   --   --   --   BILITOT 1.1  --   --   --   --   GFRNONAA >60   < > >60 >60 >60  GFRAA >60   < > >60 >60 >60  ANIONGAP 9   < > 11 5 7    < > = values in this interval not displayed.     Hematology Recent Labs  Lab 02/07/18 [phone] 02/08/18  0108 02/08/18 0905  WBC 19.6* QUESTIONABLE RESULTS, RECOMMEND RECOLLECT TO VERIFY 22.1*  QUESTIONABLE RESULTS, RECOMMEND RECOLLECT TO VERIFY  RBC 4.91 QUESTIONABLE RESULTS, RECOMMEND RECOLLECT TO VERIFY 4.91  QUESTIONABLE RESULTS, RECOMMEND RECOLLECT TO VERIFY  HGB 14.5 QUESTIONABLE RESULTS, RECOMMEND RECOLLECT TO VERIFY 14.6  QUESTIONABLE RESULTS, RECOMMEND RECOLLECT TO VERIFY  HCT 42.7 QUESTIONABLE RESULTS, RECOMMEND RECOLLECT TO VERIFY 44.4  QUESTIONABLE RESULTS, RECOMMEND RECOLLECT TO VERIFY  MCV 87.0 QUESTIONABLE RESULTS, RECOMMEND RECOLLECT TO VERIFY 90.4  QUESTIONABLE RESULTS, RECOMMEND RECOLLECT TO VERIFY  MCH 29.5 QUESTIONABLE RESULTS, RECOMMEND RECOLLECT TO VERIFY 29.7  QUESTIONABLE RESULTS, RECOMMEND RECOLLECT TO VERIFY  MCHC 34.0 QUESTIONABLE RESULTS, RECOMMEND RECOLLECT TO VERIFY 32.9  QUESTIONABLE RESULTS, RECOMMEND RECOLLECT TO VERIFY  RDW 16.1* QUESTIONABLE RESULTS, RECOMMEND RECOLLECT TO VERIFY 17.2*  QUESTIONABLE RESULTS, RECOMMEND RECOLLECT TO VERIFY  PLT 171 QUESTIONABLE RESULTS, RECOMMEND RECOLLECT TO VERIFY 193  QUESTIONABLE RESULTS, RECOMMEND RECOLLECT TO VERIFY    Cardiac Enzymes Recent Labs  Lab 02/01/18 1508  TROPONINI 0.14*   No results for input(s): TROPIPOC in the last 168 hours.   BNPNo results for input(s): BNP, PROBNP in the last 168 hours.   DDimer No results for input(s): DDIMER in the last 168 hours.   Radiology    Ct Abdomen Pelvis W Contrast  Result  Date: 02/07/2018 CLINICAL DATA:  History of perforated ulcer. EXAM: CT ABDOMEN AND PELVIS WITH CONTRAST TECHNIQUE: Multidetector CT imaging of the abdomen and pelvis was performed using the standard protocol following bolus administration of intravenous contrast. CONTRAST:  OMNIPAQUE IOHEXOL 300 MG/ML  SOLN COMPARISON:  CT scan of January 31, 2018. FINDINGS: Lower chest: Moderate loculated pleural effusions are noted bilaterally with adjacent atelectasis. Hepatobiliary: Gallbladder is dilated. No definite gallstones are noted. Stable left hepatic cyst is noted. 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 Increased amount of fluid is noted in the porta hepatis region. Pancreas: Fatty replacement of the pancreas is again noted. No definite inflammation or ductal dilatation is noted. Spleen: Normal in size without focal abnormality. Adrenals/Urinary Tract: Adrenal glands appear normal. Stable right renal cyst is noted. No hydronephrosis or renal obstruction is noted. No renal or ureteral calculi are noted. Urinary bladder is decompressed secondary to Foley catheter. Stomach/Bowel: Stable gastric distention is noted. Wall thickening is seen involving the gastric antrum and proximal duodenum with surrounding fluid concerning for peptic ulcer disease. No free air is noted at this time. Dilated small bowel loops are noted concerning for ileus or distal small bowel obstruction. Diverticulosis of proximal sigmoid colon is noted. There is no evidence of colonic dilatation. The appendix is not clearly visualized. Vascular/Lymphatic: Aortic atherosclerosis. No enlarged abdominal or pelvic lymph nodes. Reproductive: Mild prostatic calcifications are noted. Other: Mild anasarca is noted. Fat containing left inguinal hernia is noted. Musculoskeletal: No acute or significant osseous findings. IMPRESSION: Thrombosis of main portal vein is noted. These results will be called to the ordering clinician or representative by  the Radiologist Assistant, and communication documented in the PACS or zVision Dashboard. Wall thickening of gastric antrum and proximal duodenum is noted with an increased amount of fluid in this area and in the porta hepatis region, most consistent with peptic ulcer disease. There is no free air or pneumoperitoneum is noted at this time. Small bowel dilatation is noted concerning for ileus or distal small bowel obstruction. Fatty replacement of the pancreas is again noted. Mild anasarca. Sigmoid diverticulosis without inflammation. Moderate bilateral loculated pleural effusions are noted with adjacent atelectasis. Aortic Atherosclerosis (ICD10-I70.0).  Electronically Signed   By: Lupita Raider, M.D.   On: 02/07/2018 12:24   Dg Chest Port 1 View  Result Date: 02/08/2018 CLINICAL DATA:  84 year old male with shortness of breath. Recent portal venous thrombus, suspected perforated peptic ulcer. EXAM: PORTABLE CHEST 1 VIEW COMPARISON:  02/05/2018 and earlier. CT Abdomen and Pelvis 02/07/2018. FINDINGS: Portable AP semi upright view at 0847 hours. Layering and partially loculated pleural effusions demonstrated at the lung bases yesterday by CT with atelectasis. Superimposed gas trapping in the left lower lobe. Stable ventilation since 02/05/2018. No pneumothorax. Stable cardiac size and mediastinal contours. Retained barium type contrast in the left upper abdomen. IMPRESSION: Bilateral pleural effusions, partially loculated on the left and with gas trapping in the left lower lobe. Ventilation is stable since 02/05/2018. Electronically Signed   By: Odessa Fleming M.D.   On: 02/08/2018 10:16   Dg Swallowing Func-speech Pathology  Result Date: 02/06/2018 Objective Swallowing Evaluation: Type of Study: MBS-Modified Barium Swallow Study  Patient Details Name: DAVAN NAWABI MRN: [id number] Date of Birth: [birth year] Today's Date: 02/06/2018 Time: SLP Start Time (ACUTE ONLY): 1415 -SLP Stop Time (ACUTE ONLY): 1455 SLP Time  Calculation (min) (ACUTE ONLY): 40 min Past Medical History: Past Medical History: Diagnosis Date . Allergy  . Anxiety  . Diverticulosis  . History of colon polyps  . History of hemorrhoids  . History of inguinal herniorrhaphy   right . Hyperlipidemia  . Hypogonadism, male  . Sciatica   right Past Surgical History: Past Surgical History: Procedure Laterality Date . CARPAL TUNNEL RELEASE   . HERNIA REPAIR   HPI: pt is a 84 yo male adm to [facility] with abdomen pain- found to have perforated peptic ulcer.   Today pt with increased ATX at lung bases, WBC elevation.  Swallow evaluation ordered.  Subjective: pt awake in chair Assessment / Plan / Recommendation CHL IP CLINICAL IMPRESSIONS 02/06/2018 Clinical Impression Patient presents with moderately severe oropharyngeal dysphagia mostly characterized by weakness.  Pt as only given approximately 9 boluses of barium - thin, nectar and pudding.  Poor oral transiting/bolus cohesion skills due to weakness noted which results in premature spillage and oral residuals.  Pt also extended head upward to aid oral transiting with liquids of increased viscocity.  In addition, pharyngeal swallow is weak resulting in residuals that patient intermittently senses.  Multiple swallows with all consistencies assist to decrease residuals but did not full eliminate them - thus increasing pt's aspiration risk.  Although he did NOT aspirate or penetrate, certain he does experience aspiration due to this level of weakness and residuals.  At this time, would recommend to continue clear liquids via tsp only - with pt swallowing multiple times with each tsp.  NO CUPS/STRAWS nor thicker consistencies.  Pt educated to findings/recommendation but uncertain to his level of comprehension.  Thankful pt is for palliative consult as concerned for his ability to meet nutritional needs with level of dysphagia.   SLP Visit Diagnosis Dysphagia, oropharyngeal phase (R13.12) Attention and concentration deficit  following -- Frontal lobe and executive function deficit following -- Impact on safety and function Severe aspiration risk;Risk for inadequate nutrition/hydration;Moderate aspiration risk   CHL IP TREATMENT RECOMMENDATION 02/06/2018 Treatment Recommendations Therapy as outlined in treatment plan below   Prognosis 02/06/2018 Prognosis for Safe Diet Advancement Guarded Barriers to Reach Goals -- Barriers/Prognosis Comment -- CHL IP DIET RECOMMENDATION 02/06/2018 SLP Diet Recommendations Thin liquid Liquid Administration via No straw;Spoon Medication Administration Other (Comment) Compensations Small sips/bites;Slow rate;Multiple dry swallows  after each bite/sip Postural Changes Remain semi-upright after after feeds/meals (Comment);Seated upright at 90 degrees   CHL IP OTHER RECOMMENDATIONS 02/06/2018 Recommended Consults -- Oral Care Recommendations Oral care QID Other Recommendations Have oral suction available   CHL IP FOLLOW UP RECOMMENDATIONS 02/06/2018 Follow up Recommendations (No Data)   CHL IP FREQUENCY AND DURATION 02/06/2018 Speech Therapy Frequency (ACUTE ONLY) min 1 x/week Treatment Duration 1 week      CHL IP ORAL PHASE 02/06/2018 Oral Phase Impaired Oral - Pudding Teaspoon -- Oral - Pudding Cup -- Oral - Honey Teaspoon -- Oral - Honey Cup -- Oral - Nectar Teaspoon -- Oral - Nectar Cup Weak lingual manipulation;Delayed oral transit;Decreased bolus cohesion;Reduced posterior propulsion Oral - Nectar Straw Weak lingual manipulation;Delayed oral transit;Decreased bolus cohesion;Reduced posterior propulsion Oral - Thin Teaspoon Weak lingual manipulation;Delayed oral transit;Decreased bolus cohesion;Reduced posterior propulsion Oral - Thin Cup Weak lingual manipulation;Delayed oral transit;Decreased bolus cohesion;Reduced posterior propulsion Oral - Thin Straw Weak lingual manipulation;Delayed oral transit;Decreased bolus cohesion;Reduced posterior propulsion Oral - Puree Weak lingual manipulation;Delayed oral  transit;Decreased bolus cohesion;Reduced posterior propulsion;Holding of bolus Oral - Mech Soft -- Oral - Regular -- Oral - Multi-Consistency -- Oral - Pill -- Oral Phase - Comment pt extended head upward to aid oral transiting of boluses, excessively delayed with pudding - needing liquids to transit piecemealed portion of bolus  CHL IP PHARYNGEAL PHASE 02/06/2018 Pharyngeal Phase Impaired Pharyngeal- Pudding Teaspoon -- Pharyngeal -- Pharyngeal- Pudding Cup -- Pharyngeal -- Pharyngeal- Honey Teaspoon -- Pharyngeal -- Pharyngeal- Honey Cup -- Pharyngeal -- Pharyngeal- Nectar Teaspoon Pharyngeal residue - valleculae;Pharyngeal residue - pyriform;Reduced tongue base retraction;Reduced epiglottic inversion;Reduced pharyngeal peristalsis;Reduced laryngeal elevation;Reduced airway/laryngeal closure Pharyngeal -- Pharyngeal- Nectar Cup -- Pharyngeal -- Pharyngeal- Nectar Straw Pharyngeal residue - valleculae;Pharyngeal residue - pyriform;Reduced pharyngeal peristalsis;Reduced epiglottic inversion;Reduced tongue base retraction;Reduced laryngeal elevation;Reduced airway/laryngeal closure Pharyngeal -- Pharyngeal- Thin Teaspoon Pharyngeal residue - valleculae;Pharyngeal residue - pyriform;Reduced pharyngeal peristalsis;Reduced epiglottic inversion;Reduced tongue base retraction;Reduced laryngeal elevation;Reduced airway/laryngeal closure Pharyngeal -- Pharyngeal- Thin Cup Pharyngeal residue - valleculae;Pharyngeal residue - pyriform;Reduced pharyngeal peristalsis;Reduced epiglottic inversion;Reduced tongue base retraction;Reduced laryngeal elevation;Reduced airway/laryngeal closure Pharyngeal -- Pharyngeal- Thin Straw Pharyngeal residue - valleculae;Pharyngeal residue - pyriform;Reduced pharyngeal peristalsis;Reduced epiglottic inversion;Reduced tongue base retraction;Reduced airway/laryngeal closure;Reduced laryngeal elevation Pharyngeal -- Pharyngeal- Puree Pharyngeal residue - valleculae;Pharyngeal residue -  pyriform;Reduced epiglottic inversion;Reduced pharyngeal peristalsis;Reduced tongue base retraction;Reduced laryngeal elevation;Reduced airway/laryngeal closure Pharyngeal -- Pharyngeal- Mechanical Soft -- Pharyngeal -- Pharyngeal- Regular -- Pharyngeal -- Pharyngeal- Multi-consistency -- Pharyngeal -- Pharyngeal- Pill -- Pharyngeal -- Pharyngeal Comment pt does not sense residuals, multiple swallows decrease pharyngeal resdiuals but do not fully clear them,  he is at aspiration due to residuals despite not aspirating  CHL IP CERVICAL ESOPHAGEAL PHASE 02/06/2018 Cervical Esophageal Phase Impaired Pudding Teaspoon -- Pudding Cup -- Honey Teaspoon -- Honey Cup -- Nectar Teaspoon -- Nectar Cup -- Nectar Straw -- Thin Teaspoon -- Thin Cup -- Thin Straw -- Puree -- Mechanical Soft -- Regular -- Multi-consistency -- Pill -- Cervical Esophageal Comment -- Chales Abrahams 02/06/2018, 3:00 PM  Donavan Burnet, MS [facility] SLP Acute Rehab Services Pager [phone] Office [phone]              Cardiac Studies   Echo unable to visualize  Patient Profile     84 y.o. male with new onset persistent atrial fibrillation in the setting of perforated peptic ulcer with mildly elevated troponin, right bundle branch block left anterior fascicular block now with delirium  Assessment & Plan  Rhythm.    Still in afib / flutter    Continue on dilt and amio Echo today    Perforated peptic ulcer Surgery following    Anasarca    Got fluid bolus earlier   Will give lasix x 1     Elevated troponin -Demand ischemia low level flat.  DNR.    For questions or updates, please contact [facility] Please consult [email or web address] for contact info under        Signed, Dietrich PatesPaula Mikie Misner, MD  02/08/2018, 1:44 PM

## 2018-02-08 NOTE — Progress Notes (Signed)
8 Days Post-Op    CC: Perforated gastric ulcer.  Subjective: Looks much better this a.m.  He is sitting up trying to drink some of the clear liquids his daughter is giving him.  He is not complaining of abdominal pain.  He says he seems more comfortable from a respiratory standpoint also.  Objective: Vital signs in last 24 hours: Temp:  [97.3 F (36.3 C)-97.7 F (36.5 C)] 97.6 F (36.4 C) (12/05 0345) Pulse Rate:  [61-119] 115 (12/05 0500) Resp:  [14-25] 21 (12/05 0500) BP: (83-121)/(38-70) 110/48 (12/05 0500) SpO2:  [93 %-97 %] 96 % (12/05 0503) Last BM Date: 02/07/18 720 PO recorded 4124 IV 1290 urine Stool x 2 Afebrile, Tachycardic K+ 3.5 Lactate 2.4 H/H is down and being rechecked BMW:[id number] pleural effusions, partially loculated on the left and with gas trapping in the left lower lobe.  CT 12/4: Results listed below Intake/Output from previous day: 12/04 0701 - 12/05 0700 In: 4124.3 [P.O.:720; I.V.:1092.9; IV Piggyback:2311.4] Out: 1290 [Urine:1290] Intake/Output this shift: No intake/output data recorded.  General appearance: alert, cooperative and no distress Resp: clear to auscultation bilaterally and Anterior GI: soft, non-tender; bowel sounds normal; no masses,  no organomegaly  Lab Results:  Recent Labs    02/07/18 0635 02/08/18 0108  WBC 19.6* 13.4*  HGB 14.5 9.2*  HCT 42.7 28.0*  PLT 171 107*    BMET Recent Labs    02/07/18 0433 02/08/18 0108  NA 142 141  K 3.2* 3.5  CL 116* 115*  CO2 21* 19*  GLUCOSE 136* 142*  BUN 41* 40*  CREATININE 1.05 0.98  CALCIUM 6.5* 7.0*   PT/INR No results for input(s): LABPROT, INR in the last 72 hours.  Recent Labs  Lab 02/02/18 0324  AST 27  ALT 44  ALKPHOS 83  BILITOT 1.1  PROT 5.0*  ALBUMIN 2.6*     Lipase     Component Value Date/Time   LIPASE 59 (H) 01/31/2018 1708     Medications: . albuterol  2.5 mg Nebulization BID  . feeding supplement  1 Container Oral TID BM  . mouth rinse   15 mL Mouth Rinse BID  . pantoprazole  40 mg Intravenous Q12H  . sodium bicarbonate  50 mEq Intravenous BID  . sucralfate  1 g Oral Q6H    Assessment/Plan Hx RIH Repair 2006/colonoscopy 2004 - Dr. Russella Dar Delirium - Ativan New AF w RVR/ elevated Troponin/demand ischemia - Amiodarone/Cardizem HTN: stableBP but low Diverticulosis COPD/Emphysema-On an inhaler-CXR 12/2: Worsening atelectasis both lower lungs Hard of hearing Right scrotal hydrocele Deconditioning Severe malnutrition free albumin less than 5 Hypokalemia/hypomagnesemia -being replaced. Dysphagia -taking a teaspoon at a time  Perforated gastric/duodenalulcer:Hx of esophageal impaction/Duodenal inflamation 09/2017 - Keep NGT out for now &abdominal pain better; con't abx; strict NPO. -Upper GI12/2/19to assess integrity of self patch-Negative for gastric or duodenalextravasation. Numerous duodenal diverticula. - started clears 02/05/18 - original CT 11/27, WBC: 10.8>>11.6>>20 0.1>>19.1>> 13.4  -Repeat CT 02/07/2018: Rhombus is the main portal vein wall thickening of gastric antrum and proximal            duodenum with an increased amount of fluid in this area the portal hepatis region most consistent with peptic        ulcer disease.  No free air or pneumoperitoneum small bowel dilatation concerning for ileus or SBO  - Heparin started 12/4 FEN: IV fluids/NPO=>>clears 12/2 ID: Cipro 11/28-12/2; Azactam 12/2 =>> day 2/Flagyl 11/27 =>>day7 DVT: none SCD's ordered Follow up: TBD  Plan: Ongoing medical management the CBC is in question is being repeated.  He has been seen by medicine and palliative.  I will leave him on clear liquids right now as he is only taking this teaspoon at a time.  LOS: 8 days    Manuel Reilly 02/08/2018 [phone]

## 2018-02-08 NOTE — Progress Notes (Signed)
CRITICAL VALUE ALERT  Critical Value:  Lactic acid 2.4   Date & Time Notied:  02/08/2018 0950  Provider Notified: Dr. Sunnie Nielsenegalado

## 2018-02-08 NOTE — Progress Notes (Signed)
ANTICOAGULATION CONSULT NOTE - Follow Up Consult  Pharmacy Consult for Heparin Indication: portal vein thrombosis   Allergies  Allergen Reactions  . Penicillins Anaphylaxis, Hives, Shortness Of Breath and Rash    Has patient had a PCN reaction causing immediate rash, facial/tongue/throat swelling, SOB or lightheadedness with hypotension: Yes Has patient had a PCN reaction causing severe rash involving mucus membranes or skin necrosis: Yes Has patient had a PCN reaction that required hospitalization Yes Has patient had a PCN reaction occurring within the last 10 years: No If all of the above answers are "NO", then may proceed with Cephalosporin use.   . Procaine Hcl Anaphylaxis and Swelling  . Wasp Venom Swelling    Swelling of throat and swelling at the site     Patient Measurements: Height: 5\' 9"  (175.3 cm) Weight: 193 lb 2 oz (87.6 kg) IBW/kg (Calculated) : 70.7 Heparin Dosing Weight:   Vital Signs: Temp: 97.7 F (36.5 C) (12/05 1942) Temp Source: Oral (12/05 1942) BP: 88/38 (12/05 2200) Pulse Rate: 118 (12/05 2200)  Labs: Recent Labs    02/06/18 0258 02/07/18 0433 02/07/18 0635 02/08/18 0108 02/08/18 0905 02/08/18 2200  HGB 13.6  --  14.5 QUESTIONABLE RESULTS, RECOMMEND RECOLLECT TO VERIFY 14.6  QUESTIONABLE RESULTS, RECOMMEND RECOLLECT TO VERIFY  --   HCT 42.0  --  42.7 QUESTIONABLE RESULTS, RECOMMEND RECOLLECT TO VERIFY 44.4  QUESTIONABLE RESULTS, RECOMMEND RECOLLECT TO VERIFY  --   PLT 161  --  171 QUESTIONABLE RESULTS, RECOMMEND RECOLLECT TO VERIFY 193  QUESTIONABLE RESULTS, RECOMMEND RECOLLECT TO VERIFY  --   HEPARINUNFRC  --   --   --  <0.10*  --  <0.10*  CREATININE 0.94 1.05  --  0.98  --   --     Estimated Creatinine Clearance: 52.7 mL/min (by C-G formula based on SCr of 0.98 mg/dL).   Medications:  Infusions:  . amiodarone 30 mg/hr (02/08/18 1808)  . diltiazem (CARDIZEM) infusion 5 mg/hr (02/08/18 1808)  . fluconazole (DIFLUCAN) IV Stopped  (02/08/18 1249)  . heparin 1,300 Units/hr (02/08/18 1808)  . meropenem (MERREM) IV Stopped (02/08/18 1038)    Assessment: Patient with low heparin level.  No heparin issues per RN.  Goal of Therapy:  Heparin level 0.3-0.7 units/ml Monitor platelets by anticoagulation protocol: Yes   Plan:  Increase heparin to 1450 units/hr Recheck level at 0800  Darlina GuysGrimsley Jr, Jacquenette ShoneJulian Crowford 02/08/2018,10:45 PM

## 2018-02-08 NOTE — Progress Notes (Signed)
ANTICOAGULATION CONSULT NOTE - Follow Up Consult  Pharmacy Consult for Heparin Indication: portal vein thrombosis  Allergies  Allergen Reactions  . Penicillins Anaphylaxis, Hives, Shortness Of Breath and Rash    Has patient had a PCN reaction causing immediate rash, facial/tongue/throat swelling, SOB or lightheadedness with hypotension: Yes Has patient had a PCN reaction causing severe rash involving mucus membranes or skin necrosis: Yes Has patient had a PCN reaction that required hospitalization Yes Has patient had a PCN reaction occurring within the last 10 years: No If all of the above answers are "NO", then may proceed with Cephalosporin use.   . Procaine Hcl Anaphylaxis and Swelling  . Wasp Venom Swelling    Swelling of throat and swelling at the site     Patient Measurements: Height: 5\' 9"  (175.3 cm) Weight: 193 lb 2 oz (87.6 kg) IBW/kg (Calculated) : 70.7 Heparin Dosing Weight:   Vital Signs: Temp: 97.6 F (36.4 C) (12/04 2330) Temp Source: Axillary (12/04 2330) BP: 86/46 (12/04 2100) Pulse Rate: 116 (12/04 2100)  Labs: Recent Labs    02/06/18 0258 02/07/18 0433 02/07/18 0635 02/08/18 0108  HGB 13.6  --  14.5 9.2*  HCT 42.0  --  42.7 28.0*  PLT 161  --  171 107*  HEPARINUNFRC  --   --   --  <0.10*  CREATININE 0.94 1.05  --  0.98    Estimated Creatinine Clearance: 52.7 mL/min (by C-G formula based on SCr of 0.98 mg/dL).   Medications:  Infusions:  . amiodarone 30 mg/hr (02/07/18 1800)  . diltiazem (CARDIZEM) infusion 10 mg/hr (02/07/18 1800)  . heparin 1,100 Units/hr (02/07/18 1800)  . meropenem (MERREM) IV Stopped (02/07/18 1754)    Assessment: Patient with low heparin level.  No heparin issues per RN.  Goal of Therapy:  Heparin level 0.3-0.7 units/ml Monitor platelets by anticoagulation protocol: Yes   Plan:  Increase heparin to 1300 units/hr Recheck level at [id number]  Lynnita Somma Jr, ArapahoeJulian Crowford 02/08/2018,3:41 AM

## 2018-02-08 NOTE — Progress Notes (Signed)
Daily Progress Note   Patient Name: Manuel Reilly       Date: 02/08/2018 DOB: [birth year]  Age: 84 y.o. MRN#: [id number] Attending Physician: Alba Cory, MD Primary Care Physician: [name], MD Admit Date: 01/31/2018  Reason for Consultation/Follow-up: Establishing goals of care and Psychosocial/spiritual support  Subjective: Spoke with patient and daughter [name]) at bedside.  He is taking sips of broth and coughing.  Patient is able to tell me his name and where he is.  I ask what is 5+6 and he tells me 7.   We talked about artificial feeding - Manuel Reilly (the patient) states he does not want that.  [name] confirms she does not want artificial feeding.  The safest way to move forward is with careful hand feeding.  Both Dimas Aguas and [name] are happy with this idea.   [name] talks about caring for her mother while she was dying with cancer after a leg amputation.  She states she will move into her fathers home and care for him at home.  The daughters really want to care for their father at home.     She asked if he was better - I explained that in the big picture he was not better.  While the CT scan does not show free air any longer it does show a clot.  1 thing improves, something else goes wrong.    The family does not like the word Hospice - so we talked in terms of a home health agency that supports people in this phase of their life - and the goal is to be comfortable and happy rather than to push.  [name] supports the idea of taking her father home with Hospice support when he is stable enough to go.     Assessment: 84 yo male who was surprising healthy until this hospitalization (walking/functioning independently).  Perforated peptic ulceration, now aspirating with  moderately severe oral dysphagia, portal vein thrombosis - Dr. Sunnie Nielsen is attempting to determine if it is safe to anticoagulate. (hgb appears to have dropped 5 grams overnight).   Patient Profile/HPI: 84 y.o. male  with past medical history of anxiety and sciatica who was admitted on 01/31/2018 with abdominal pain.  He was found to have a perforated peptic ulcer and new onset atrial fibrillation.  His most recent  CT scan (12/4) does not show free air, but does show portal vein thrombosis.  SLP evaluation indicates moderately severe oral dysphagia.  Lactic acid drawn earlier today was 3.6.     Length of Stay: 8  Current Medications: Scheduled Meds:  . albuterol  2.5 mg Nebulization BID  . feeding supplement  1 Container Oral TID BM  . mouth rinse  15 mL Mouth Rinse BID  . pantoprazole  40 mg Intravenous Q12H  . sucralfate  1 g Oral Q6H    Continuous Infusions: . amiodarone 30 mg/hr (02/08/18 1008)  . diltiazem (CARDIZEM) infusion 5 mg/hr (02/08/18 1008)  . fluconazole (DIFLUCAN) IV    . meropenem (MERREM) IV 1 g (02/08/18 1008)    PRN Meds: acetaminophen **OR** acetaminophen, ALPRAZolam, iohexol, ipratropium-albuterol, morphine injection, ondansetron (ZOFRAN) IV  Physical Exam        Well developed elderly gentleman, mildly confused, sitting up bright eyed and smiling. CV tachy resp no distress Abdomen firm, moderately distended Upper extremities swollen and cool  Vital Signs: BP (!) 106/48   Pulse (!) 115   Temp 97.6 F (36.4 C) (Oral)   Resp (!) 24   Ht 5\' 9"  (1.753 m)   Wt 87.6 kg   SpO2 97%   BMI 28.52 kg/m  SpO2: SpO2: 97 % O2 Device: O2 Device: Nasal Cannula O2 Flow Rate: O2 Flow Rate (L/min): 4 L/min  Intake/output summary:   Intake/Output Summary (Last 24 hours) at 02/08/2018 1040 Last data filed at 02/08/2018 1008 Gross per 24 hour  Intake 2921.51 ml  Output 1140 ml  Net 1781.51 ml   LBM: Last BM Date: 02/08/18 Baseline Weight: Weight: 75.8  kg Most recent weight: Weight: 87.6 kg       Palliative Assessment/Data: 30%    Flowsheet Rows     Most Recent Value  Intake Tab  Referral Department  Hospitalist  Unit at Time of Referral  ICU  Date Notified  02/06/18  Palliative Care Type  New Palliative care  Reason for referral  Clarify Goals of Care  Date of Admission  01/31/18  # of days IP prior to Palliative referral  6  Clinical Assessment  Psychosocial & Spiritual Assessment  Palliative Care Outcomes      Patient Active Problem List   Diagnosis Date Noted  . Palliative care encounter   . AKI (acute kidney injury) (HCC)   . Hypotension due to hypovolemia   . Perforated peptic ulcer (HCC) 01/31/2018  . Elevated troponin 01/31/2018  . Pleural effusion 01/31/2018  . HTN (hypertension) 01/31/2018  . Insomnia 09/20/2017  . Venous insufficiency 09/20/2017  . COPD (chronic obstructive pulmonary disease) (HCC) 12/07/2016  . Greater trochanteric bursitis of right hip 03/05/2015  . Esophageal achalasia 11/13/2014  . Obstructive chronic bronchitis without exacerbation (HCC) 11/26/2012  . Hyperlipidemia 07/19/2007  . Elevated blood pressure (not hypertension) 07/19/2007    Palliative Care Plan    Recommendations/Plan:  No artificial feeding.  No TPN/TNA  Treat the treatable with a leaning towards comfort.  Home with Hospice Services when medically able.  PMT will follow from a distance.  Please call us back sooner if we are needed [phone]  Code Status:  DNR  Prognosis:   Unable to determine.  Less than 3 months (potentially significantly less) due to recurrent aspiration and an inability take in enough nutrition and hydration to sustain himself.  ?PVT and anticoagulation?Marland Kitchen.  Discharge Planning:  Home with Hospice  Care plan was discussed with Dr. Sunnie Nielsenegalado, Dtr  Cheryl at bedside.  Thank you for allowing the Palliative Medicine Team to assist in the care of this patient.  Total time spent:  25  min.     Greater than 50%  of this time was spent counseling and coordinating care related to the above assessment and plan.  Norvel Richards, PA-C Palliative Medicine  Please contact Palliative MedicineTeam phone at [phone] for questions and concerns between 7 am - 7 pm.   Please see AMION for individual provider pager numbers.

## 2018-02-08 NOTE — Progress Notes (Signed)
  Speech Language Pathology Treatment: Dysphagia  Patient Details Name: Manuel Reilly MRN: [id number] DOB: 23-Dec-1923 Today's Date: 02/08/2018 Time: 0925-1000 SLP Time Calculation (min) (ACUTE ONLY): 35 min  Assessment / Plan / Recommendation Clinical Impression  Pt today appears with edematous arms and feet.  Daughter requested legs be elevated which SLP assisted RN to conduct.  SLP provided pt with intake of tsps of water and [employer] Breeze.  Inconsistent cough after water - which may be due to penetration/aspiration.  Pt with NO difficulties with Resource Breeze - thus advised pt and daughter to provide [employer] Breeze for nutrition and comfort.  Pt needs to continue multiple swallows with each bolus sitting fully upright.  Pt will not meet nutritional needs with this dietary plan but note palliative meeting indicating no plans for PEG - Thank you.  Would not recommend dietary advancement at this time = also pt overtly coughing with intake does not appear comfortable to him.    Until goals of care get definitely established would recommend continue current plan.  Using teach back, written signs pt and daughter educated to mitigation strategies.    Of note, pt appears with whitish patches on faucial pillars and intraoral buccal regions concerning for oral candidiasis.  Secure texted MD with these concerns and informed RN.      HPI HPI: [facility] 84 [facility]- found to have perforated peptic ulcer.   Today pt with increased ATX at lung bases, WBC elevation.  Swallow evaluation ordered.      SLP Plan  Continue with current plan of care       Recommendations  Diet recommendations: Thin liquid(clears) Liquids provided via: Teaspoon Supervision: Full supervision/cueing for compensatory strategies Compensations: Slow rate;Small sips/bites Postural Changes and/or Swallow Maneuvers: Seated upright 90 degrees;Upright 30-60 min after meal                Oral Care  Recommendations: Oral care BID Follow up Recommendations: None SLP Visit Diagnosis: Dysphagia, oropharyngeal phase (R13.12) Plan: Continue with current plan of care       GO               Manuel Burnetamara Serenitee Fuertes, MS [facility] SLP Acute Rehab Services Pager [phone] Office [phone]  [name], Tanmay Halteman Ann 02/08/2018, 10:26 AM

## 2018-02-08 NOTE — Progress Notes (Signed)
The PIV in the patient's left forearm was found lying in the patient's bed. PIV dressing still intact on patient's forearm. Normal saline was running to the left forearm IV upon finding that the IV had been dislodged. Upon further assessment, I noted that the patient had a serous blister around the LFA IV site. While removing PIV dressing, the serous blister ruptured. A foam dressing was applied to the site. Primary RN, Matilde BashKameha R., made aware.

## 2018-02-08 NOTE — Progress Notes (Addendum)
PROGRESS NOTE    Manuel Reilly  JXB:[id number] DOB: 01-05-1924 DOA: 01/31/2018 PCP: [name], MD    Brief Narrative: 84 year old with past medical history significant for diverticulosis, inguinal hernia repair, colon polyps, COPD who presented with abdominal pain for the last 6 months pain got really worse over the last 2 days prior to admission.  CT abdomen and pelvis show highly suspicious for for perforated peptic  ulcer with a small foci of extraluminal air in the porta hepatis.  Patient was admitted to the stepdown unit.  he was started on IV fluid, IV antibiotics.  He was evaluated by surgery, who recommended conservative management.  Surgery continue to follow on patient.  Patient also developed A. fib with RVR.  He was evaluated by cardiology.  Patient was a started on amiodarone infusion, IV Cardizem.  Not on anticoagulation due to perforation.  Patient developed worsening lactic acidosis, continues to have leukocytosis on 02/07/2018.  Surgery order a CT abdomen and pelvis; thrombosis of the portal vein. Wall thickening of gastric antrum and proximal duodenum is noted with an increased amount of fluid in this area and in the porta hepatis region, most consistent with peptic ulcer disease. There is no free air or pneumoperitoneum is noted at this time.  Dilation of the small bowel concerning with ileus versus a small bowel obstruction.    Assessment & Plan:   Principal Problem:   Perforated peptic ulcer (HCC) Active Problems:   COPD (chronic obstructive pulmonary disease) (HCC)   Elevated troponin   Pleural effusion   HTN (hypertension)   Palliative care encounter   Perforated duodenal ulcer (HCC)   Dysphagia, oral phase   Palliative care by specialist   Encounter for hospice care discussion  Perforated peptic ulcer/ileus; Patient was admitted to the stepdown unit.  NG tube was placed to suction.  He was a started on IV PPI.  Patient remove NG on 11-28. Patient  on clear diet. He developed lactic acidosis, persistent leukocytosis. CT abdomen and pelvis repeated 12;04; showed thrombosis of portal vein, wall thickening of gastric antrum and proximal duodenum with an increase amount of fluid in this area in the porta hepatis region, most consistent with peptic ulcer disease. Surgery following. IV antibiotics changed to carbapenem.  WBC still elevated.   Acute hypoxic respiratory failure;  Chest x ray showed pleural effusion partially loculated.  Started on meropenem 12-04. He will received a dose of IV lasix.   Lactic acidosis; Concern with infectious process. Received IV bolus.  CT abdomen negative for abscess.   Portal vein thrombosis;  Discussed with surgery, ok to start anticoagulation. No Bolus.  continue with heparin Gtt.  Hb stable. Lab error this am.   Hypokalemia; improved,. Oral supplement.  Hypomagnesemia; replaced.   Metabolic acidosis; improved On sodium bicarb amp IV x4 doses.  Bilateral lower extremity edema; volume overload. Has received PRN IV Lasix.  Thrombocytopenia; From acute illness, consumption. Monitor.  Normalized  Transaminases. Normalize.  Delirium, acute metabolic encephalopathy; Related to acute illness. CT head negative for acute finding. He has required as needed Ativan. Appears improved  Oral candidiasis.  Start fluconazole.   RN Pressure Injury Documentation:    Malnutrition Type:  Nutrition Problem: Inadequate protein intake Etiology: other (see comment)(current diet order)   Malnutrition Characteristics:  Signs/Symptoms: other (comment)(CLD does not meet estimated protein need)   Nutrition Interventions:  Interventions: Refer to RD note for recommendations  Estimated body mass index is 28.52 kg/m as calculated from the following:  Height as of this encounter: 5\' 9"  (1.753 m).   Weight as of this encounter: 87.6 kg.   DVT prophylaxis: SCDs Code Status: DNR Family  Communication: Daughter at bedside Disposition Plan: Remain in the stepdown unit, IV antibiotics, management of lactic acidosis.  Consultants:   Surgery  Cardiology   Procedures:  None   Antimicrobials:  Aztreonam 12-02  Flagyl 11-28   Subjective: He was having SOB earlier this am.  He is breathing better. Taking sips of clears.  denies abdominal pain.   Objective: Vitals:   02/08/18 0800 02/08/18 0900 02/08/18 1000 02/08/18 1200  BP: (!) 95/39 (!) 113/44 (!) 106/48   Pulse:  (!) 116 (!) 115   Resp: 17 (!) 21 (!) 24   Temp:    97.6 F (36.4 C)  TempSrc:    Oral  SpO2: 98% 96% 97%   Weight:      Height:        Intake/Output Summary (Last 24 hours) at 02/08/2018 1424 Last data filed at 02/08/2018 1008 Gross per 24 hour  Intake 2921.51 ml  Output 1140 ml  Net 1781.51 ml   Filed Weights   02/01/18 0201 02/06/18 0943 02/07/18 0500  Weight: 75.8 kg 87.4 kg 87.6 kg    Examination:  General exam; chronic ill, NAD Respiratory system: crackles bilaterally  Cardiovascular system: S 1, S 2 IRR Gastrointestinal system: BS present, soft, mild ender.  Central nervous system: alert and oriented.  Extremities: edema BL     Data Reviewed: I have personally reviewed following labs and imaging studies  CBC: Recent Labs  Lab 02/02/18 0324  02/04/18 0316 02/05/18 0356 02/06/18 0258 02/07/18 0635 02/08/18 0108 02/08/18 0905  WBC 9.0   < > 11.6* 20.1* 19.1* 19.6* QUESTIONABLE RESULTS, RECOMMEND RECOLLECT TO VERIFY 22.1*  QUESTIONABLE RESULTS, RECOMMEND RECOLLECT TO VERIFY  NEUTROABS 8.0*  --  9.9* 17.3*  --  16.8*  --   --   HGB 13.6   < > 12.8* 14.5 13.6 14.5 QUESTIONABLE RESULTS, RECOMMEND RECOLLECT TO VERIFY 14.6  QUESTIONABLE RESULTS, RECOMMEND RECOLLECT TO VERIFY  HCT 41.8   < > 39.0 44.3 42.0 42.7 QUESTIONABLE RESULTS, RECOMMEND RECOLLECT TO VERIFY 44.4  QUESTIONABLE RESULTS, RECOMMEND RECOLLECT TO VERIFY  MCV 93.9   < > 90.9 90.4 91.7 87.0 QUESTIONABLE  RESULTS, RECOMMEND RECOLLECT TO VERIFY 90.4  QUESTIONABLE RESULTS, RECOMMEND RECOLLECT TO VERIFY  PLT 127*   < > 105* 136* 161 171 QUESTIONABLE RESULTS, RECOMMEND RECOLLECT TO VERIFY 193  QUESTIONABLE RESULTS, RECOMMEND RECOLLECT TO VERIFY   < > = values in this interval not displayed.   Basic Metabolic Panel: Recent Labs  Lab 02/03/18 0320 02/04/18 0316 02/05/18 0356 02/06/18 0258 02/07/18 0433 02/08/18 0108  NA 134* 134* 137 139 142 141  K 4.0 3.5 3.9 4.3 3.2* 3.5  CL 109 110 110 112* 116* 115*  CO2 21* 17* 20* 16* 21* 19*  GLUCOSE 107* 125* 124* 119* 136* 142*  BUN 28* 29* 31* 35* 41* 40*  CREATININE 0.75 0.90 0.78 0.94 1.05 0.98  CALCIUM 7.8* 7.7* 8.0* 7.8* 6.5* 7.0*  MG 2.1 2.1  --  2.1 1.6* 2.0   GFR: Estimated Creatinine Clearance: 52.7 mL/min (by C-G formula based on SCr of 0.98 mg/dL). Liver Function Tests: Recent Labs  Lab 02/02/18 0324  AST 27  ALT 44  ALKPHOS 83  BILITOT 1.1  PROT 5.0*  ALBUMIN 2.6*   No results for input(s): LIPASE, AMYLASE in the last 168 hours. No results for  input(s): AMMONIA in the last 168 hours. Coagulation Profile: No results for input(s): INR, PROTIME in the last 168 hours. Cardiac Enzymes: Recent Labs  Lab 02/01/18 1508  TROPONINI 0.14*   BNP (last 3 results) No results for input(s): PROBNP in the last 8760 hours. HbA1C: No results for input(s): HGBA1C in the last 72 hours. CBG: Recent Labs  Lab 02/07/18 0741  GLUCAP 122*   Lipid Profile: No results for input(s): CHOL, HDL, LDLCALC, TRIG, CHOLHDL, LDLDIRECT in the last 72 hours. Thyroid Function Tests: No results for input(s): TSH, T4TOTAL, FREET4, T3FREE, THYROIDAB in the last 72 hours. Anemia Panel: No results for input(s): VITAMINB12, FOLATE, FERRITIN, TIBC, IRON, RETICCTPCT in the last 72 hours. Sepsis Labs: Recent Labs  Lab 02/07/18 1608 02/07/18 2053 02/08/18 0108 02/08/18 0905  LATICACIDVEN 2.6* 2.3* 2.9* 2.4*    Recent Results (from the past 240  hour(s))  MRSA PCR Screening     Status: None   Collection Time: 02/01/18  3:00 PM  Result Value Ref Range Status   MRSA by PCR NEGATIVE NEGATIVE Final    Comment:        The GeneXpert MRSA Assay (FDA approved for NASAL specimens only), is one component of a comprehensive MRSA colonization surveillance program. It is not intended to diagnose MRSA infection nor to guide or monitor treatment for MRSA infections. Performed at [facility], [address] [place], [place] [address]   Culture, blood (routine x 2)     Status: None (Preliminary result)   Collection Time: 02/05/18  9:40 AM  Result Value Ref Range Status   Specimen Description   Final    BLOOD RIGHT HAND Performed at [facility], [address] [place], [place] [address]    Special Requests   Final    BOTTLES DRAWN AEROBIC ONLY Blood Culture results may not be optimal due to an inadequate volume of blood received in culture bottles Performed at [facility], [address] [place], [place] [address]    Culture   Final    NO GROWTH 3 DAYS Performed at [facility], [address] [place], [place] [address]    Report Status PENDING  Incomplete  Culture, blood (routine x 2)     Status: None (Preliminary result)   Collection Time: 02/05/18  9:40 AM  Result Value Ref Range Status   Specimen Description   Final    BLOOD RIGHT HAND Performed at [facility], [address] [place], [place] [address]    Special Requests   Final    BOTTLES DRAWN AEROBIC ONLY Blood Culture results may not be optimal due to an inadequate volume of blood received in culture bottles Performed at [facility], [address] [place], [place] [address]    Culture   Final    NO GROWTH 3 DAYS Performed at [facility], [address] [place], [place] [address]    Report Status PENDING  Incomplete         Radiology  Studies: Ct Abdomen Pelvis W Contrast  Result Date: 02/07/2018 CLINICAL DATA:  History of perforated ulcer. EXAM: CT ABDOMEN AND PELVIS WITH CONTRAST TECHNIQUE: Multidetector CT imaging of the abdomen and pelvis was performed using the standard protocol following bolus administration of intravenous contrast. CONTRAST:  100mL OMNIPAQUE IOHEXOL 300 MG/ML  SOLN COMPARISON:  CT scan of January 31, 2018. FINDINGS: Lower chest: Moderate loculated pleural effusions are noted bilaterally with adjacent  atelectasis. Hepatobiliary: Gallbladder is dilated. No definite gallstones are noted. Stable left hepatic cyst is noted. 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 Increased amount of fluid is noted in the porta hepatis region. Pancreas: Fatty replacement of the pancreas is again noted. No definite inflammation or ductal dilatation is noted. Spleen: Normal in size without focal abnormality. Adrenals/Urinary Tract: Adrenal glands appear normal. Stable right renal cyst is noted. No hydronephrosis or renal obstruction is noted. No renal or ureteral calculi are noted. Urinary bladder is decompressed secondary to Foley catheter. Stomach/Bowel: Stable gastric distention is noted. Wall thickening is seen involving the gastric antrum and proximal duodenum with surrounding fluid concerning for peptic ulcer disease. No free air is noted at this time. Dilated small bowel loops are noted concerning for ileus or distal small bowel obstruction. Diverticulosis of proximal sigmoid colon is noted. There is no evidence of colonic dilatation. The appendix is not clearly visualized. Vascular/Lymphatic: Aortic atherosclerosis. No enlarged abdominal or pelvic lymph nodes. Reproductive: Mild prostatic calcifications are noted. Other: Mild anasarca is noted. Fat containing left inguinal hernia is noted. Musculoskeletal: No acute or significant osseous findings. IMPRESSION: Thrombosis of main portal vein is noted. These results will be called  to the ordering clinician or representative by the Radiologist Assistant, and communication documented in the PACS or zVision Dashboard. Wall thickening of gastric antrum and proximal duodenum is noted with an increased amount of fluid in this area and in the porta hepatis region, most consistent with peptic ulcer disease. There is no free air or pneumoperitoneum is noted at this time. Small bowel dilatation is noted concerning for ileus or distal small bowel obstruction. Fatty replacement of the pancreas is again noted. Mild anasarca. Sigmoid diverticulosis without inflammation. Moderate bilateral loculated pleural effusions are noted with adjacent atelectasis. Aortic Atherosclerosis (ICD10-I70.0). Electronically Signed   By: Lupita Raider, M.D.   On: 02/07/2018 12:24   Dg Chest Port 1 View  Result Date: 02/08/2018 CLINICAL DATA:  84 year old male with shortness of breath. Recent portal venous thrombus, suspected perforated peptic ulcer. EXAM: PORTABLE CHEST 1 VIEW COMPARISON:  02/05/2018 and earlier. CT Abdomen and Pelvis 02/07/2018. FINDINGS: Portable AP semi upright view at 0847 hours. Layering and partially loculated pleural effusions demonstrated at the lung bases yesterday by CT with atelectasis. Superimposed gas trapping in the left lower lobe. Stable ventilation since 02/05/2018. No pneumothorax. Stable cardiac size and mediastinal contours. Retained barium type contrast in the left upper abdomen. IMPRESSION: Bilateral pleural effusions, partially loculated on the left and with gas trapping in the left lower lobe. Ventilation is stable since 02/05/2018. Electronically Signed   By: Odessa Fleming M.D.   On: 02/08/2018 10:16   Dg Swallowing Func-speech Pathology  Result Date: 02/06/2018 Objective Swallowing Evaluation: Type of Study: MBS-Modified Barium Swallow Study  Patient Details Name: Manuel Reilly MRN: [id number] Date of Birth: Jun 05, 1923 Today's Date: 02/06/2018 Time: SLP Start Time (ACUTE ONLY):  1415 -SLP Stop Time (ACUTE ONLY): 1455 SLP Time Calculation (min) (ACUTE ONLY): 40 min Past Medical History: Past Medical History: Diagnosis Date . Allergy  . Anxiety  . Diverticulosis  . History of colon polyps  . History of hemorrhoids  . History of inguinal herniorrhaphy   right . Hyperlipidemia  . Hypogonadism, male  . Sciatica   right Past Surgical History: Past Surgical History: Procedure Laterality Date . CARPAL TUNNEL RELEASE   . HERNIA REPAIR   HPI: pt is a 84 yo male adm [facility] with abdomen pain-  found to have perforated peptic ulcer.   Today pt with increased ATX at lung bases, WBC elevation.  Swallow evaluation ordered.  Subjective: pt awake in chair Assessment / Plan / Recommendation CHL IP CLINICAL IMPRESSIONS 02/06/2018 Clinical Impression Patient presents with moderately severe oropharyngeal dysphagia mostly characterized by weakness.  Pt as only given approximately 9 boluses of barium - thin, nectar and pudding.  Poor oral transiting/bolus cohesion skills due to weakness noted which results in premature spillage and oral residuals.  Pt also extended head upward to aid oral transiting with liquids of increased viscocity.  In addition, pharyngeal swallow is weak resulting in residuals that patient intermittently senses.  Multiple swallows with all consistencies assist to decrease residuals but did not full eliminate them - thus increasing pt's aspiration risk.  Although he did NOT aspirate or penetrate, certain he does experience aspiration due to this level of weakness and residuals.  At this time, would recommend to continue clear liquids via tsp only - with pt swallowing multiple times with each tsp.  NO CUPS/STRAWS nor thicker consistencies.  Pt educated to findings/recommendation but uncertain to his level of comprehension.  Thankful pt is for palliative consult as concerned for his ability to meet nutritional needs with level of dysphagia.   SLP Visit Diagnosis Dysphagia, oropharyngeal phase  (R13.12) Attention and concentration deficit following -- Frontal lobe and executive function deficit following -- Impact on safety and function Severe aspiration risk;Risk for inadequate nutrition/hydration;Moderate aspiration risk   CHL IP TREATMENT RECOMMENDATION 02/06/2018 Treatment Recommendations Therapy as outlined in treatment plan below   Prognosis 02/06/2018 Prognosis for Safe Diet Advancement Guarded Barriers to Reach Goals -- Barriers/Prognosis Comment -- CHL IP DIET RECOMMENDATION 02/06/2018 SLP Diet Recommendations Thin liquid Liquid Administration via No straw;Spoon Medication Administration Other (Comment) Compensations Small sips/bites;Slow rate;Multiple dry swallows after each bite/sip Postural Changes Remain semi-upright after after feeds/meals (Comment);Seated upright at 90 degrees   CHL IP OTHER RECOMMENDATIONS 02/06/2018 Recommended Consults -- Oral Care Recommendations Oral care QID Other Recommendations Have oral suction available   CHL IP FOLLOW UP RECOMMENDATIONS 02/06/2018 Follow up Recommendations (No Data)   CHL IP FREQUENCY AND DURATION 02/06/2018 Speech Therapy Frequency (ACUTE ONLY) min 1 x/week Treatment Duration 1 week      CHL IP ORAL PHASE 02/06/2018 Oral Phase Impaired Oral - Pudding Teaspoon -- Oral - Pudding Cup -- Oral - Honey Teaspoon -- Oral - Honey Cup -- Oral - Nectar Teaspoon -- Oral - Nectar Cup Weak lingual manipulation;Delayed oral transit;Decreased bolus cohesion;Reduced posterior propulsion Oral - Nectar Straw Weak lingual manipulation;Delayed oral transit;Decreased bolus cohesion;Reduced posterior propulsion Oral - Thin Teaspoon Weak lingual manipulation;Delayed oral transit;Decreased bolus cohesion;Reduced posterior propulsion Oral - Thin Cup Weak lingual manipulation;Delayed oral transit;Decreased bolus cohesion;Reduced posterior propulsion Oral - Thin Straw Weak lingual manipulation;Delayed oral transit;Decreased bolus cohesion;Reduced posterior propulsion Oral - Puree  Weak lingual manipulation;Delayed oral transit;Decreased bolus cohesion;Reduced posterior propulsion;Holding of bolus Oral - Mech Soft -- Oral - Regular -- Oral - Multi-Consistency -- Oral - Pill -- Oral Phase - Comment pt extended head upward to aid oral transiting of boluses, excessively delayed with pudding - needing liquids to transit piecemealed portion of bolus  CHL IP PHARYNGEAL PHASE 02/06/2018 Pharyngeal Phase Impaired Pharyngeal- Pudding Teaspoon -- Pharyngeal -- Pharyngeal- Pudding Cup -- Pharyngeal -- Pharyngeal- Honey Teaspoon -- Pharyngeal -- Pharyngeal- Honey Cup -- Pharyngeal -- Pharyngeal- Nectar Teaspoon Pharyngeal residue - valleculae;Pharyngeal residue - pyriform;Reduced tongue base retraction;Reduced epiglottic inversion;Reduced pharyngeal peristalsis;Reduced laryngeal elevation;Reduced airway/laryngeal closure Pharyngeal --  Pharyngeal- Nectar Cup -- Pharyngeal -- Pharyngeal- Nectar Straw Pharyngeal residue - valleculae;Pharyngeal residue - pyriform;Reduced pharyngeal peristalsis;Reduced epiglottic inversion;Reduced tongue base retraction;Reduced laryngeal elevation;Reduced airway/laryngeal closure Pharyngeal -- Pharyngeal- Thin Teaspoon Pharyngeal residue - valleculae;Pharyngeal residue - pyriform;Reduced pharyngeal peristalsis;Reduced epiglottic inversion;Reduced tongue base retraction;Reduced laryngeal elevation;Reduced airway/laryngeal closure Pharyngeal -- Pharyngeal- Thin Cup Pharyngeal residue - valleculae;Pharyngeal residue - pyriform;Reduced pharyngeal peristalsis;Reduced epiglottic inversion;Reduced tongue base retraction;Reduced laryngeal elevation;Reduced airway/laryngeal closure Pharyngeal -- Pharyngeal- Thin Straw Pharyngeal residue - valleculae;Pharyngeal residue - pyriform;Reduced pharyngeal peristalsis;Reduced epiglottic inversion;Reduced tongue base retraction;Reduced airway/laryngeal closure;Reduced laryngeal elevation Pharyngeal -- Pharyngeal- Puree Pharyngeal residue -  valleculae;Pharyngeal residue - pyriform;Reduced epiglottic inversion;Reduced pharyngeal peristalsis;Reduced tongue base retraction;Reduced laryngeal elevation;Reduced airway/laryngeal closure Pharyngeal -- Pharyngeal- Mechanical Soft -- Pharyngeal -- Pharyngeal- Regular -- Pharyngeal -- Pharyngeal- Multi-consistency -- Pharyngeal -- Pharyngeal- Pill -- Pharyngeal -- Pharyngeal Comment pt does not sense residuals, multiple swallows decrease pharyngeal resdiuals but do not fully clear them,  he is at aspiration due to residuals despite not aspirating  CHL IP CERVICAL ESOPHAGEAL PHASE 02/06/2018 Cervical Esophageal Phase Impaired Pudding Teaspoon -- Pudding Cup -- Honey Teaspoon -- Honey Cup -- Nectar Teaspoon -- Nectar Cup -- Nectar Straw -- Thin Teaspoon -- Thin Cup -- Thin Straw -- Puree -- Mechanical Soft -- Regular -- Multi-consistency -- Pill -- Cervical Esophageal Comment -- Chales Abrahams 02/06/2018, 3:00 PM  Donavan Burnet, MS [facility] SLP Acute Rehab Services Pager [phone] Office [phone]                  Scheduled Meds: . albuterol  2.5 mg Nebulization BID  . feeding supplement  1 Container Oral TID BM  . furosemide  80 mg Intravenous Once  . mouth rinse  15 mL Mouth Rinse BID  . pantoprazole  40 mg Intravenous Q12H  . sucralfate  1 g Oral Q6H   Continuous Infusions: . amiodarone 30 mg/hr (02/08/18 1008)  . diltiazem (CARDIZEM) infusion 5 mg/hr (02/08/18 1008)  . fluconazole (DIFLUCAN) IV Stopped (02/08/18 1249)  . heparin 1,300 Units/hr (02/08/18 1252)  . meropenem (MERREM) IV Stopped (02/08/18 1038)     LOS: 8 days    Time spent: 35 minutes.     Alba Cory, MD [facility] Pager [phone]  If 7PM-7AM, please contact night-coverage [email or web address] Password [email or web address] 02/08/2018, 2:24 PM

## 2018-02-09 ENCOUNTER — Inpatient Hospital Stay (HOSPITAL_COMMUNITY): Payer: Medicare Other

## 2018-02-09 DIAGNOSIS — R198 Other specified symptoms and signs involving the digestive system and abdomen: Secondary | ICD-10-CM

## 2018-02-09 DIAGNOSIS — R000 Tachycardia, unspecified: Secondary | ICD-10-CM

## 2018-02-09 DIAGNOSIS — I4891 Unspecified atrial fibrillation: Secondary | ICD-10-CM

## 2018-02-09 LAB — BASIC METABOLIC PANEL
ANION GAP: 8 (ref 5–15)
BUN: 44 mg/dL — ABNORMAL HIGH (ref 8–23)
CALCIUM: 7.7 mg/dL — AB (ref 8.9–10.3)
CO2: 22 mmol/L (ref 22–32)
Chloride: 110 mmol/L (ref 98–111)
Creatinine, Ser: 1.14 mg/dL (ref 0.61–1.24)
GFR calc Af Amer: >60 mL/min (ref >60)
GFR, EST NON AFRICAN AMERICAN: 56 mL/min — AB (ref >60)
Glucose, Bld: 136 mg/dL — ABNORMAL HIGH (ref 70–99)
Potassium: 4.3 mmol/L (ref 3.5–5.1)
Sodium: 140 mmol/L (ref 135–145)

## 2018-02-09 LAB — HEPARIN LEVEL (UNFRACTIONATED)
Heparin Unfractionated: 0.5 IU/mL (ref 0.30–0.70)
Heparin Unfractionated: 0.62 IU/mL (ref 0.30–0.70)

## 2018-02-09 LAB — CBC
HCT: 43.6 % (ref 39.0–52.0)
Hemoglobin: 14.7 g/dL (ref 13.0–17.0)
MCH: 30.1 pg (ref 26.0–34.0)
MCHC: 33.7 g/dL (ref 30.0–36.0)
MCV: 89.2 fL (ref 80.0–100.0)
Platelets: 210 10*3/uL (ref 150–400)
RBC: 4.89 MIL/uL (ref 4.22–5.81)
RDW: 17.5 % — AB (ref 11.5–15.5)
WBC: 22.2 10*3/uL — ABNORMAL HIGH (ref 4.0–10.5)
nRBC: 0.1 % (ref 0.0–0.2)

## 2018-02-09 LAB — MAGNESIUM: Magnesium: 2.2 mg/dL (ref 1.7–2.4)

## 2018-02-09 LAB — ECHOCARDIOGRAM LIMITED
Height: 69 in
Weight: 3089.97 oz

## 2018-02-09 MED ORDER — HEPARIN (PORCINE) 25000 UT/250ML-% IV SOLN
1400.0000 [IU]/h | INTRAVENOUS | Status: DC
  Administered 2018-02-09 – 2018-02-10 (×2): 1400 [IU]/h via INTRAVENOUS
  Filled 2018-02-09: qty 250

## 2018-02-09 MED ORDER — STARCH (THICKENING) PO POWD
ORAL | Status: DC | PRN
  Filled 2018-02-09: qty 227

## 2018-02-09 MED ORDER — FUROSEMIDE 10 MG/ML IJ SOLN
60.0000 mg | Freq: Once | INTRAMUSCULAR | Status: AC
  Administered 2018-02-09: 60 mg via INTRAVENOUS
  Filled 2018-02-09: qty 6

## 2018-02-09 NOTE — Progress Notes (Signed)
2D Echocardiogram has been performed.  Manuel Reilly 02/09/2018, 1:24 PM

## 2018-02-09 NOTE — Care Management Note (Signed)
Case Management Note  Patient Details  Name: Roque CashHoward H Fraga MRN: [id number] Date of Birth: 24-Apr-1923  Subjective/Objective:                   84 year old with past medical history significant for diverticulosis, inguinal hernia repair, colon polyps, COPD who presented with abdominal pain for the last 6 months pain got really worse over the last 2 days prior to admission.  CT abdomen and pelvis show highly suspicious for for perforated peptic  ulcer with a small foci of extraluminal air in the porta hepatis.  Patient was admitted to the stepdown unit.  he was started on IV fluid, IV antibiotics.  Patient also developed A. fib with RVR.  He was evaluated by cardiology.  Patient was a started on amiodarone infusion, IV Cardizem.  Not on anticoagulation due to perforation.  Patient developed worsening lactic acidosis, continues to have leukocytosis on 02/07/2018.  Surgery order a CT abdomen and pelvis; thrombosis of the portal vein. Wall thickening of gastric antrum and proximal duodenum is noted with an increased amount of fluid in this area and in the porta hepatis region, most consistent with peptic ulcer disease. There is no free air or pneumoperitoneum is noted at this time.  Dilation of the small bowel concerning with ileus versus a small bowel obstruction. Action/Plan: Lives alone at a private residence Will follow for progression of care and clinical status. Will follow for case management needs none present at this time.- may need st snf  Expected Discharge Date:                  Expected Discharge Plan:     In-House Referral:     Discharge planning Services     Post Acute Care Choice:    Choice offered to:     DME Arranged:    DME Agency:     HH Arranged:    HH Agency:     Status of Service:     If discussed at [employer] Length of Stay Meetings, dates discussed:    Additional Comments:  Golda AcreDavis, Rhonda Lynn, RN 02/09/2018, 8:33 AM

## 2018-02-09 NOTE — Progress Notes (Signed)
Progress Note  Patient Name: Manuel Reilly Date of Encounter: 02/09/2018  Primary Cardiologist: Donato SchultzMark Skains, MD   Subjective   Pt awake   Denies CP   Appears weak  Inpatient Medications    Scheduled Meds: . feeding supplement  1 Container Oral TID BM  . furosemide  60 mg Intravenous Once  . ipratropium-albuterol  3 mL Nebulization Q6H WA  . mouth rinse  15 mL Mouth Rinse BID  . pantoprazole  40 mg Intravenous Q12H  . sucralfate  1 g Oral Q6H   Continuous Infusions: . amiodarone 30 mg/hr (02/09/18 1416)  . diltiazem (CARDIZEM) infusion 5 mg/hr (02/09/18 1416)  . fluconazole (DIFLUCAN) IV Stopped (02/09/18 1343)  . heparin 1,450 Units/hr (02/09/18 1416)  . meropenem (MERREM) IV Stopped (02/09/18 1058)   PRN Meds: acetaminophen **OR** acetaminophen, albuterol, ALPRAZolam, food thickener, iohexol, morphine injection, ondansetron (ZOFRAN) IV   Vital Signs    Vitals:   02/09/18 1200 02/09/18 1216 02/09/18 1300 02/09/18 1400  BP: (!) 109/41  (!) 111/42 (!) 106/37  Pulse: (!) 115 (!) 115 (!) 114 (!) 114  Resp: (!) 26 (!) 22 (!) 23 (!) 21  Temp: 97.7 F (36.5 C)     TempSrc: Axillary     SpO2: 95% 95% 94% 96%  Weight:      Height:        Intake/Output Summary (Last 24 hours) at 02/09/2018 1440 Last data filed at 02/09/2018 1416 Gross per 24 hour  Intake 1510.4 ml  Output 720 ml  Net 790.4 ml    Net  +  13 L  Filed Weights   02/01/18 0201 02/06/18 0943 02/07/18 0500  Weight: 75.8 kg 87.4 kg 87.6 kg    Telemetry     Prbable atrial flutter    Personally Reviewed  ECG      Physical Exam   GEN:  Awake  Sitting in bed  Neck: JVP is inncreased Cardiac:  RRR  tachycardic, no murmurs, rubs, or gallops.  Respiratory:= Rhonchi   GI: Sl distended   + BS   MS: 1+ edema Upper and lower extremities     Skin weeping  No deformity. Neuro:  Nonfocal    Labs    Chemistry Recent Labs  Lab 02/07/18 0433 02/08/18 0108 02/09/18 0835  NA 142 141 140  K 3.2*  3.5 4.3  CL 116* 115* 110  CO2 21* 19* 22  GLUCOSE 136* 142* 136*  BUN 41* 40* 44*  CREATININE 1.05 0.98 1.14  CALCIUM 6.5* 7.0* 7.7*  GFRNONAA >60 >60 56*  GFRAA >60 >60 >60  ANIONGAP 5 7 8      Hematology Recent Labs  Lab 02/08/18 0108 02/08/18 0905 02/09/18 0835  WBC QUESTIONABLE RESULTS, RECOMMEND RECOLLECT TO VERIFY 22.1*  QUESTIONABLE RESULTS, RECOMMEND RECOLLECT TO VERIFY 22.2*  RBC QUESTIONABLE RESULTS, RECOMMEND RECOLLECT TO VERIFY 4.91  QUESTIONABLE RESULTS, RECOMMEND RECOLLECT TO VERIFY 4.89  HGB QUESTIONABLE RESULTS, RECOMMEND RECOLLECT TO VERIFY 14.6  QUESTIONABLE RESULTS, RECOMMEND RECOLLECT TO VERIFY 14.7  HCT QUESTIONABLE RESULTS, RECOMMEND RECOLLECT TO VERIFY 44.4  QUESTIONABLE RESULTS, RECOMMEND RECOLLECT TO VERIFY 43.6  MCV QUESTIONABLE RESULTS, RECOMMEND RECOLLECT TO VERIFY 90.4  QUESTIONABLE RESULTS, RECOMMEND RECOLLECT TO VERIFY 89.2  MCH QUESTIONABLE RESULTS, RECOMMEND RECOLLECT TO VERIFY 29.7  QUESTIONABLE RESULTS, RECOMMEND RECOLLECT TO VERIFY 30.1  MCHC QUESTIONABLE RESULTS, RECOMMEND RECOLLECT TO VERIFY 32.9  QUESTIONABLE RESULTS, RECOMMEND RECOLLECT TO VERIFY 33.7  RDW QUESTIONABLE RESULTS, RECOMMEND RECOLLECT TO VERIFY 17.2*  QUESTIONABLE RESULTS, RECOMMEND RECOLLECT TO VERIFY  17.5*  PLT QUESTIONABLE RESULTS, RECOMMEND RECOLLECT TO VERIFY 193  QUESTIONABLE RESULTS, RECOMMEND RECOLLECT TO VERIFY 210    Cardiac Enzymes No results for input(s): TROPONINI in the last 168 hours. No results for input(s): TROPIPOC in the last 168 hours.   BNPNo results for input(s): BNP, PROBNP in the last 168 hours.   DDimer No results for input(s): DDIMER in the last 168 hours.   Radiology    Dg Abd 1 View  Result Date: 02/09/2018 CLINICAL DATA:  Abdominal pain and distension. EXAM: ABDOMEN - 1 VIEW COMPARISON:  Abdomen pelvis CT dated 02/07/2018. FINDINGS: Dilated proximal small bowel loops with mild overall improvement and normal caliber distal small bowel  loops. Gas and contrast in mildly dilated colon. The stomach remains mildly dilated with a small amount of contrast remaining. Excreted contrast in the urinary bladder. Small bilateral pleural effusions. Lumbar and thoracic spine degenerative changes. IMPRESSION: 1. Mildly improved partial small bowel obstruction. 2. Stable probable colonic ileus. 3. Small bilateral pleural effusions. Electronically Signed   By: Beckie Salts M.D.   On: 02/09/2018 10:09   Dg Chest Port 1 View  Result Date: 02/08/2018 CLINICAL DATA:  84 year old male with shortness of breath. Recent portal venous thrombus, suspected perforated peptic ulcer. EXAM: PORTABLE CHEST 1 VIEW COMPARISON:  02/05/2018 and earlier. CT Abdomen and Pelvis 02/07/2018. FINDINGS: Portable AP semi upright view at 0847 hours. Layering and partially loculated pleural effusions demonstrated at the lung bases yesterday by CT with atelectasis. Superimposed gas trapping in the left lower lobe. Stable ventilation since 02/05/2018. No pneumothorax. Stable cardiac size and mediastinal contours. Retained barium type contrast in the left upper abdomen. IMPRESSION: Bilateral pleural effusions, partially loculated on the left and with gas trapping in the left lower lobe. Ventilation is stable since 02/05/2018. Electronically Signed   By: Odessa Fleming M.D.   On: 02/08/2018 10:16    Cardiac Studies   Echo attempted again today   Unable to visualize heart   Patient Profile     84 y.o. male with new onset persistent atrial fibrillation in the setting of perforated peptic ulcer with mildly elevated troponin, right bundle branch block left anterior fascicular block now with delirium  Assessment & Plan    Rhythm.    Remains tachycardic    Repeat EKG  For now I would keep IV diltiazem    I would stop amiodarone and follow  HR   He probably wont convert.   Note he is on anticoag now for hepatic v thrombosis  Pt remains critically ill   Extensive third spacing   Would dose  lasix to try to maintain/improve volume    Blood pressure has lmited this at times  Repeat attempt at echo today was unsuccessfult   No images obtained with patient's position.  Palliative care has been consulted.    I would support comfort measures   Given current clinical state  Will sign off.  Please call with questoins.     For questions or updates, please contact [facility] Please consult [email or web address] for contact info under        Signed, Dietrich Pates, MD  02/09/2018, 2:40 PM

## 2018-02-09 NOTE — Progress Notes (Signed)
Palliative Medicine RN Note: Rec'd a call from Manuel Reilly's daughter.   She had questions about residential hospice, which I was able to answer. [facility] home. However, she is not ready for an official referral to hospice right now; she wants to talk to her sisters first.  Plan for Dr Manuel Reilly to check on Manuel Reilly tomorrow. His daughter will call if she decides to go ahead with a hospice referral.   Margret ChanceMelanie G. Lavida Patch, RN, BSN, [facility] Palliative Medicine Team 02/09/2018 4:06 PM Office [phone]

## 2018-02-09 NOTE — Progress Notes (Signed)
PHYSICAL THERAPY  Started to prepare pt to get OOB when MD came in.  Noted increased ABD distension, increased ABD pain and increased WBC.  MD has ordered an ABD X ray so will attempt to see later as pt needs to be in bed for X ray.  Felecia ShellingLori Jenayah Antu  PTA Acute  Rehabilitation Services Pager      [phone] Office      [phone]

## 2018-02-09 NOTE — Progress Notes (Signed)
Pharmacy Antibiotic Note  Manuel Reilly is a 84 y.o. male admitted on 01/31/2018 with perforated GU and DU.  Pharmacy has been consulted for meropenem dosing.  Patient with reported anaphylaxis to PCN - unable to get more specific info from patient. Patient was on ciprofloxacin/metronidazole but ciprofloxacin changed to aztreonam due to QTc prolongation. Patient WBC elevated. ID consulted 12/4  Day #3 Meropenem Afebrile WBC up 22.2 SCr up 1.14, CrCl 45 ml/min  Plan:  Based on SCr trend and borderline CrCl,  Continue meropenem 1gm q12h  Continue to follow renal function and adjust as needed  Follow up cultures  Height: 5\' 9"  (175.3 cm) Weight: 193 lb 2 oz (87.6 kg) IBW/kg (Calculated) : 70.7  Temp (24hrs), Avg:98 F (36.7 C), Min:97.5 F (36.4 C), Max:98.7 F (37.1 C)  Recent Labs  Lab 02/05/18 0356 02/06/18 0258 02/07/18 0433 02/07/18 0635  02/07/18 1334 02/07/18 1608 02/07/18 2053 02/08/18 0108 02/08/18 0905 02/09/18 0835  WBC 20.1* 19.1*  --  19.6*  --   --   --   --  QUESTIONABLE RESULTS, RECOMMEND RECOLLECT TO VERIFY 22.1*  QUESTIONABLE RESULTS, RECOMMEND RECOLLECT TO VERIFY 22.2*  CREATININE 0.78 0.94 1.05  --   --   --   --   --  0.98  --  1.14  LATICACIDVEN 1.9  --  3.2*  --    < > 2.6* 2.6* 2.3* 2.9* 2.4*  --    < > = values in this interval not displayed.    Estimated Creatinine Clearance: 45.3 mL/min (by C-G formula based on SCr of 1.14 mg/dL).    Allergies  Allergen Reactions  . Penicillins Anaphylaxis, Hives, Shortness Of Breath and Rash    Has patient had a PCN reaction causing immediate rash, facial/tongue/throat swelling, SOB or lightheadedness with hypotension: Yes Has patient had a PCN reaction causing severe rash involving mucus membranes or skin necrosis: Yes Has patient had a PCN reaction that required hospitalization Yes Has patient had a PCN reaction occurring within the last 10 years: No If all of the above answers are "NO", then may  proceed with Cephalosporin use.   . Procaine Hcl Anaphylaxis and Swelling  . Wasp Venom Swelling    Swelling of throat and swelling at the site     Antimicrobials this admission: 11/27 cipro >> 12/2 11/27 flagyl >> 12/4 12/2 Aztreonam >> 12/4 12/4 meropenem >>  Microbiology results: 11/28 MRSA PCR: negative  12/2 BCx: ngtd  Thank you for allowing pharmacy to be a part of this patient's care.  Loralee PacasErin Taheera Thomann, PharmD, BCPS Pager: [phone] 02/09/2018 10:06 AM

## 2018-02-09 NOTE — Progress Notes (Signed)
ANTICOAGULATION CONSULT NOTE - Follow Up Consult  Pharmacy Consult for Heparin Indication: Portal vein thrombosis  Allergies  Allergen Reactions  . Penicillins Anaphylaxis, Hives, Shortness Of Breath and Rash    Has patient had a PCN reaction causing immediate rash, facial/tongue/throat swelling, SOB or lightheadedness with hypotension: Yes Has patient had a PCN reaction causing severe rash involving mucus membranes or skin necrosis: Yes Has patient had a PCN reaction that required hospitalization Yes Has patient had a PCN reaction occurring within the last 10 years: No If all of the above answers are "NO", then may proceed with Cephalosporin use.   . Procaine Hcl Anaphylaxis and Swelling  . Wasp Venom Swelling    Swelling of throat and swelling at the site     Patient Measurements: Height: 5\' 9"  (175.3 cm) Weight: 193 lb 2 oz (87.6 kg) IBW/kg (Calculated) : 70.7 Heparin Dosing Weight: 70.7 kg  Vital Signs: Temp: 97.7 F (36.5 C) (12/06 1200) Temp Source: Axillary (12/06 1200) BP: 106/37 (12/06 1400) Pulse Rate: 114 (12/06 1400)  Labs: Recent Labs    02/07/18 0433  02/08/18 0108 02/08/18 0905 02/08/18 2200 02/09/18 0835 02/09/18 1546  HGB  --    < > QUESTIONABLE RESULTS, RECOMMEND RECOLLECT TO VERIFY 14.6  QUESTIONABLE RESULTS, RECOMMEND RECOLLECT TO VERIFY  --  14.7  --   HCT  --    < > QUESTIONABLE RESULTS, RECOMMEND RECOLLECT TO VERIFY 44.4  QUESTIONABLE RESULTS, RECOMMEND RECOLLECT TO VERIFY  --  43.6  --   PLT  --    < > QUESTIONABLE RESULTS, RECOMMEND RECOLLECT TO VERIFY 193  QUESTIONABLE RESULTS, RECOMMEND RECOLLECT TO VERIFY  --  210  --   HEPARINUNFRC  --    < > <0.10*  --  <0.10* 0.50 0.62  CREATININE 1.05  --  0.98  --   --  1.14  --    < > = values in this interval not displayed.    Estimated Creatinine Clearance: 45.3 mL/min (by C-G formula based on SCr of 1.14 mg/dL).  Assessment: [name] with perforated peptic ulcer, new-onset AFIB, initially not  anticoagulated due to perforation. On 12/4 abd CT with portal vein thrombosis.  Pharmacy consulted to dose IV heparin.  Today, 02/09/2018  Repeat Heparin level remains therapeutic (0.62) on heparin 1450 units/hr  CBC: Hgb stable 14.7, Plts wnl  No bleeding reported  Goal of Therapy:  Heparin level 0.3-0.7 units/ml - no heparin boluses per MD Monitor platelets by anticoagulation protocol: Yes   Plan:        Decrease heparin drip slightly to 1400 units/hr  Daily heparin level and CBC  Follow up plans for long-term anticoagulation  Herby AbrahamMichelle T. Freddye Cardamone, Pharm.D [phone] 02/09/2018 4:54 PM

## 2018-02-09 NOTE — Progress Notes (Signed)
9 Days Post-Op    CC: Perforated gastric ulcer  Subjective: He is more distended today a little more tender.  He is alert and talking.   sometimes his speech is more difficult to understand than others.  PT is working with him in bed but he still a 2 person assist for mobilization in the bed.  Objective: Vital signs in last 24 hours: Temp:  [97.5 F (36.4 C)-98.7 F (37.1 C)] 98.2 F (36.8 C) (12/06 0800) Pulse Rate:  [114-119] 114 (12/06 0800) Resp:  [16-28] 20 (12/06 0800) BP: (87-137)/(31-50) 95/38 (12/06 0800) SpO2:  [93 %-99 %] 93 % (12/06 0800) Last BM Date: 02/08/18 P.o. not recorded 1359 IV Urine 350 BM x1 Afebrile vital signs are stable blood pressures in the 90s range. Persistent leukocytosis: WBC 22.2. H/H 14/43.6. Lightless are stable CXR 12/5: Bilateral pleural effusions, partially loculated on the left gas trapping lower lobe ventilation stable.    intake/Output from previous day: 12/05 0701 - 12/06 0700 In: 1359.4 [I.V.:789.1; IV Piggyback:570.3] Out: 720 [Urine:370] Intake/Output this shift: Total I/O In: 192.3 [P.O.:120; I.V.:72.3] Out: -   General appearance: alert, cooperative, no distress and Looks a little more uncomfortable this a.m. GI: Abdomen soft distended more this a.m.  Bit more sore with the distention.,  No rebound/peritonitis.  Lab Results:  Recent Labs    02/08/18 0905 02/09/18 0835  WBC 22.1*  QUESTIONABLE RESULTS, RECOMMEND RECOLLECT TO VERIFY 22.2*  HGB 14.6  QUESTIONABLE RESULTS, RECOMMEND RECOLLECT TO VERIFY 14.7  HCT 44.4  QUESTIONABLE RESULTS, RECOMMEND RECOLLECT TO VERIFY 43.6  PLT 193  QUESTIONABLE RESULTS, RECOMMEND RECOLLECT TO VERIFY 210    BMET Recent Labs    02/08/18 0108 02/09/18 0835  NA 141 140  K 3.5 4.3  CL 115* 110  CO2 19* 22  GLUCOSE 142* 136*  BUN 40* 44*  CREATININE 0.98 1.14  CALCIUM 7.0* 7.7*   PT/INR No results for input(s): LABPROT, INR in the last 72 hours.  No results for input(s):  AST, ALT, ALKPHOS, BILITOT, PROT, ALBUMIN in the last 168 hours.   Lipase     Component Value Date/Time   LIPASE 59 (H) 01/31/2018 1708     Medications: . feeding supplement  1 Container Oral TID BM  . ipratropium-albuterol  3 mL Nebulization Q6H WA  . mouth rinse  15 mL Mouth Rinse BID  . pantoprazole  40 mg Intravenous Q12H  . sucralfate  1 g Oral Q6H    Assessment/Plan Hx RIH Repair 2006/colonoscopy 2004 - Dr. 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 Dysphagia -taking a teaspoon at a time  Perforated gastric/duodenalulcer:Hx of esophageal impaction/Duodenal inflamation 09/2017 - Keep NGT out for now &abdominal pain better; con't abx; strict NPO. -Upper GI12/2/19to assess integrity of self patch-Negative for gastric or duodenalextravasation. Numerous duodenal diverticula. - started clears 02/05/18 - original CT 11/27, WBC: 10.8>>11.6>>20 0.1>>19.1>> 13.4  -Repeat CT 02/07/2018: Thrombus is the main portal vein wall thickening of gastric antrum and proximal      duodenum with an increased amount of fluid in this area the portal hepatis region most consistent with                  Peptic ulcer disease.  No free air or pneumoperitoneum small bowel dilatation concerning for ileus  or SBO         - Heparin started 12/4   FEN: IV fluids/NPO=>>clears 12/2 ID: Cipro 11/28-12/2; Azactam 12/2 =>>day 2/Flagyl 11/27 =>>day7 DVT: none SCD's ordered Follow up: TBD  Plan: Dr. Sunnie Nielsen is going to get some plain films on him.  He is taking very little p.o. but he seems to be tolerating what he is getting.  No complaints of nausea or increased pain  when I am there, but he did tell Dr. Sunnie Nielsen that he was having pain.       LOS: 9 days    Manuel Reilly 02/09/2018 [phone]

## 2018-02-09 NOTE — Progress Notes (Signed)
PROGRESS NOTE    Manuel Reilly  ZOX:[id number] DOB: [birth year] DOA: 01/31/2018 PCP: Myrlene Brokerrawford, Elizabeth A, MD    Brief Narrative: 84 year old with past medical history significant for diverticulosis, inguinal hernia repair, colon polyps, COPD who presented with abdominal pain for the last 6 months pain got really worse over the last 2 days prior to admission.  CT abdomen and pelvis show highly suspicious for for perforated peptic  ulcer with a small foci of extraluminal air in the porta hepatis.  Patient was admitted to the stepdown unit.  he was started on IV fluid, IV antibiotics.  He was evaluated by surgery, who recommended conservative management.  Surgery continue to follow on patient.  Patient also developed A. fib with RVR.  He was evaluated by cardiology.  Patient was a started on amiodarone infusion, IV Cardizem.  Not on anticoagulation due to perforation.  Patient developed worsening lactic acidosis, continues to have leukocytosis on 02/07/2018.  Surgery order a CT abdomen and pelvis; thrombosis of the portal vein. Wall thickening of gastric antrum and proximal duodenum is noted with an increased amount of fluid in this area and in the porta hepatis region, most consistent with peptic ulcer disease. There is no free air or pneumoperitoneum is noted at this time.  Dilation of the small bowel concerning with ileus versus a small bowel obstruction.    Assessment & Plan:   Principal Problem:   Perforated peptic ulcer (HCC) Active Problems:   COPD (chronic obstructive pulmonary disease) (HCC)   Elevated troponin   Pleural effusion   HTN (hypertension)   Palliative care encounter   Perforated duodenal ulcer (HCC)   Dysphagia, oral phase   Palliative care by specialist   Encounter for hospice care discussion  Perforated peptic ulcer/ileus; Patient was admitted to the stepdown unit.  NG tube was placed to suction.  He was a started on IV PPI.  Patient remove NG on 11-28. Patient  on clear diet. He developed lactic acidosis, persistent leukocytosis. CT abdomen and pelvis repeated 12;04; showed thrombosis of portal vein, wall thickening of gastric antrum and proximal duodenum with an increase amount of fluid in this area in the porta hepatis region, most consistent with peptic ulcer disease. Surgery following. IV antibiotics changed to carbapenem on 02/07/2018.  WBC still elevated.  Patient complaining of worsening abdominal pain today.  Abdomen appears to be more distended.  KUB showed persistent he continues colonic distention.  Discuss with surgery, pain and abdominal distention and small bowel dilation may be related to portal vein thrombosis. Will defer to surgery advancing diet.  Acute hypoxic respiratory failure;  Chest x ray showed pleural effusion partially loculated.  Started on meropenem 12-04. We will give IV Lasix  Lactic acidosis; Concern with infectious process. Received IV bolus.  CT abdomen negative for abscess.   Portal vein thrombosis;  Discussed with surgery, ok to start anticoagulation. No Bolus.  Continue with heparin Gtt.    Hypokalemia; improved,. Oral supplement.  Hypomagnesemia; replaced.   Metabolic acidosis; improved On sodium bicarb amp IV x4 doses.  Bilateral lower extremity edema; volume overload. Has received PRN IV Lasix.  Thrombocytopenia; From acute illness, consumption. Monitor.  Normalized  Transaminases. Normalize.  Delirium, acute metabolic encephalopathy; Related to acute illness. CT head negative for acute finding. He has required as needed Ativan. Appears improved  Oral candidiasis.  Start fluconazole.   Dysphagia;  At risk For aspiration.  RN Pressure Injury Documentation:    Malnutrition Type:  Nutrition Problem: Inadequate protein  intake Etiology: other (see comment)(current diet order)   Malnutrition Characteristics:  Signs/Symptoms: other (comment)(CLD does not meet estimated protein  need)   Nutrition Interventions:  Interventions: Refer to RD note for recommendations  Estimated body mass index is 28.52 kg/m as calculated from the following:   Height as of this encounter: 5\' 9"  (1.753 m).   Weight as of this encounter: 87.6 kg.   DVT prophylaxis: SCDs Code Status: DNR Family Communication: Daughter at bedside.  On discussion with daughter who was at bedside.  Explained to her higher than abdominal pain and abdominal distention might be related to portal vein thrombosis. Explain to her that limited factor is aspiration risk, now worsening abdominal distension, [ersistent leukocytosis.   Disposition Plan: Remain in the stepdown unit, IV antibiotics, management of lactic acidosis.  Consultants:   Surgery  Cardiology   Procedures:  None   Antimicrobials:  Aztreonam 12-02  Flagyl 11-28   Subjective: He is complaining of abdominal pain today. Abdomen is more distended. He had a bowel movement. Per Nurse patient after drinking cough.  Objective: Vitals:   02/09/18 1200 02/09/18 1216 02/09/18 1300 02/09/18 1400  BP: (!) 109/41  (!) 111/42 (!) 106/37  Pulse: (!) 115 (!) 115 (!) 114 (!) 114  Resp: (!) 26 (!) 22 (!) 23 (!) 21  Temp: 97.7 F (36.5 C)     TempSrc: Axillary     SpO2: 95% 95% 94% 96%  Weight:      Height:        Intake/Output Summary (Last 24 hours) at 02/09/2018 1504 Last data filed at 02/09/2018 1416 Gross per 24 hour  Intake 1241.71 ml  Output 720 ml  Net 521.71 ml   Filed Weights   02/01/18 0201 02/06/18 0943 02/07/18 0500  Weight: 75.8 kg 87.4 kg 87.6 kg    Examination:  General exam; chronic ill-appearing Respiratory system; Bilateral crackles and ronchus.  Cardiovascular system: S 1, S 2 IRR Gastrointestinal system: BS present, distended, tender, Central nervous system: alert and oriented.  Extremities: edema 4 extremities.      Data Reviewed: I have personally reviewed following labs and imaging  studies  CBC: Recent Labs  Lab 02/04/18 0316 02/05/18 0356 02/06/18 0258 02/07/18 0635 02/08/18 0108 02/08/18 0905 02/09/18 0835  WBC 11.6* 20.1* 19.1* 19.6* QUESTIONABLE RESULTS, RECOMMEND RECOLLECT TO VERIFY 22.1*  QUESTIONABLE RESULTS, RECOMMEND RECOLLECT TO VERIFY 22.2*  NEUTROABS 9.9* 17.3*  --  16.8*  --   --   --   HGB 12.8* 14.5 13.6 14.5 QUESTIONABLE RESULTS, RECOMMEND RECOLLECT TO VERIFY 14.6  QUESTIONABLE RESULTS, RECOMMEND RECOLLECT TO VERIFY 14.7  HCT 39.0 44.3 42.0 42.7 QUESTIONABLE RESULTS, RECOMMEND RECOLLECT TO VERIFY 44.4  QUESTIONABLE RESULTS, RECOMMEND RECOLLECT TO VERIFY 43.6  MCV 90.9 90.4 91.7 87.0 QUESTIONABLE RESULTS, RECOMMEND RECOLLECT TO VERIFY 90.4  QUESTIONABLE RESULTS, RECOMMEND RECOLLECT TO VERIFY 89.2  PLT 105* 136* 161 171 QUESTIONABLE RESULTS, RECOMMEND RECOLLECT TO VERIFY 193  QUESTIONABLE RESULTS, RECOMMEND RECOLLECT TO VERIFY 210   Basic Metabolic Panel: Recent Labs  Lab 02/04/18 0316 02/05/18 0356 02/06/18 0258 02/07/18 0433 02/08/18 0108 02/09/18 0835  NA 134* 137 139 142 141 140  K 3.5 3.9 4.3 3.2* 3.5 4.3  CL 110 110 112* 116* 115* 110  CO2 17* 20* 16* 21* 19* 22  GLUCOSE 125* 124* 119* 136* 142* 136*  BUN 29* 31* 35* 41* 40* 44*  CREATININE 0.90 0.78 0.94 1.05 0.98 1.14  CALCIUM 7.7* 8.0* 7.8* 6.5* 7.0* 7.7*  MG 2.1  --  2.1 1.6* 2.0 2.2   GFR: Estimated Creatinine Clearance: 45.3 mL/min (by C-G formula based on SCr of 1.14 mg/dL). Liver Function Tests: No results for input(s): AST, ALT, ALKPHOS, BILITOT, PROT, ALBUMIN in the last 168 hours. No results for input(s): LIPASE, AMYLASE in the last 168 hours. No results for input(s): AMMONIA in the last 168 hours. Coagulation Profile: No results for input(s): INR, PROTIME in the last 168 hours. Cardiac Enzymes: No results for input(s): CKTOTAL, CKMB, CKMBINDEX, TROPONINI in the last 168 hours. BNP (last 3 results) No results for input(s): PROBNP in the last 8760  hours. HbA1C: No results for input(s): HGBA1C in the last 72 hours. CBG: Recent Labs  Lab 02/07/18 0741  GLUCAP 122*   Lipid Profile: No results for input(s): CHOL, HDL, LDLCALC, TRIG, CHOLHDL, LDLDIRECT in the last 72 hours. Thyroid Function Tests: No results for input(s): TSH, T4TOTAL, FREET4, T3FREE, THYROIDAB in the last 72 hours. Anemia Panel: No results for input(s): VITAMINB12, FOLATE, FERRITIN, TIBC, IRON, RETICCTPCT in the last 72 hours. Sepsis Labs: Recent Labs  Lab 02/07/18 1608 02/07/18 2053 02/08/18 0108 02/08/18 0905  LATICACIDVEN 2.6* 2.3* 2.9* 2.4*    Recent Results (from the past 240 hour(s))  MRSA PCR Screening     Status: None   Collection Time: 02/01/18  3:00 PM  Result Value Ref Range Status   MRSA by PCR NEGATIVE NEGATIVE Final    Comment:        The GeneXpert MRSA Assay (FDA approved for NASAL specimens only), is one component of a comprehensive MRSA colonization surveillance program. It is not intended to diagnose MRSA infection nor to guide or monitor treatment for MRSA infections. Performed at [facility], [address] [place], [place] [address]   Culture, blood (routine x 2)     Status: None (Preliminary result)   Collection Time: 02/05/18  9:40 AM  Result Value Ref Range Status   Specimen Description   Final    BLOOD RIGHT HAND Performed at [facility], [address], [place], [place] [address]    Special Requests   Final    BOTTLES DRAWN AEROBIC ONLY Blood Culture results may not be optimal due to an inadequate volume of blood received in culture bottles Performed at [facility], [address] [place], [place] [address]    Culture   Final    NO GROWTH 4 DAYS Performed at [facility], [address] [place], [place] [address]    Report Status PENDING  Incomplete  Culture, blood (routine x 2)     Status: None (Preliminary result)   Collection Time: 02/05/18   9:40 AM  Result Value Ref Range Status   Specimen Description   Final    BLOOD RIGHT HAND Performed at [facility], [address], [place], [place] [address]    Special Requests   Final    BOTTLES DRAWN AEROBIC ONLY Blood Culture results may not be optimal due to an inadequate volume of blood received in culture bottles Performed at [facility], [address], [place], [place] [address]    Culture   Final    NO GROWTH 4 DAYS Performed at [facility], [address], [place], [place] [address]    Report Status PENDING  Incomplete         Radiology Studies: Dg Abd 1 View  Result Date: 02/09/2018 CLINICAL DATA:  Abdominal pain and distension. EXAM: ABDOMEN -  1 VIEW COMPARISON:  Abdomen pelvis CT dated 02/07/2018. FINDINGS: Dilated proximal small bowel loops with mild overall improvement and normal caliber distal small bowel loops. Gas and contrast in mildly dilated colon. The stomach remains mildly dilated with a small amount of contrast remaining. Excreted contrast in the urinary bladder. Small bilateral pleural effusions. Lumbar and thoracic spine degenerative changes. IMPRESSION: 1. Mildly improved partial small bowel obstruction. 2. Stable probable colonic ileus. 3. Small bilateral pleural effusions. Electronically Signed   By: Beckie Salts M.D.   On: 02/09/2018 10:09   Dg Chest Port 1 View  Result Date: 02/08/2018 CLINICAL DATA:  84 year old male with shortness of breath. Recent portal venous thrombus, suspected perforated peptic ulcer. EXAM: PORTABLE CHEST 1 VIEW COMPARISON:  02/05/2018 and earlier. CT Abdomen and Pelvis 02/07/2018. FINDINGS: Portable AP semi upright view at 0847 hours. Layering and partially loculated pleural effusions demonstrated at the lung bases yesterday by CT with atelectasis. Superimposed gas trapping in the left lower lobe. Stable ventilation since 02/05/2018. No pneumothorax. Stable cardiac size and mediastinal  contours. Retained barium type contrast in the left upper abdomen. IMPRESSION: Bilateral pleural effusions, partially loculated on the left and with gas trapping in the left lower lobe. Ventilation is stable since 02/05/2018. Electronically Signed   By: Odessa Fleming M.D.   On: 02/08/2018 10:16        Scheduled Meds: . feeding supplement  1 Container Oral TID BM  . furosemide  60 mg Intravenous Once  . ipratropium-albuterol  3 mL Nebulization Q6H WA  . mouth rinse  15 mL Mouth Rinse BID  . pantoprazole  40 mg Intravenous Q12H  . sucralfate  1 g Oral Q6H   Continuous Infusions: . amiodarone 30 mg/hr (02/09/18 1416)  . diltiazem (CARDIZEM) infusion 5 mg/hr (02/09/18 1416)  . fluconazole (DIFLUCAN) IV Stopped (02/09/18 1343)  . heparin 1,450 Units/hr (02/09/18 1416)  . meropenem (MERREM) IV Stopped (02/09/18 1058)     LOS: 9 days    Time spent: 35 minutes.     Alba Cory, MD [facility] Pager [phone]  If 7PM-7AM, please contact night-coverage [email or web address] Password [email or web address] 02/09/2018, 3:04 PM

## 2018-02-09 NOTE — Progress Notes (Signed)
Daily Progress Note   Patient Name: Manuel Reilly       Date: 02/09/2018 DOB: 23-Jan-1924  Age: 84 y.o. MRN#: [id number] Attending Physician: Alba Coryegalado, Belkys A, MD Primary Care Physician: Myrlene Brokerrawford, Elizabeth A, MD Admit Date: 01/31/2018  Reason for Consultation/Follow-up: Establishing goals of care and Psychosocial/spiritual support  Subjective: Spoke with patient and daughter Manuel Beach(Manuel Reilly) at bedside.  Manuel Reilly had called and discussed possibility of residential hospice with RN from out team.  She reports that she needs to discuss further with her sisters and they are planning to make a list of questions regarding residential hospice placement.  Manuel Reilly was awake and alert, and difficult to understand at time.  His only request this evening was for prayer.  Length of Stay: 9  Current Medications: Scheduled Meds:  . feeding supplement  1 Container Oral TID BM  . ipratropium-albuterol  3 mL Nebulization Q6H WA  . mouth rinse  15 mL Mouth Rinse BID  . pantoprazole  40 mg Intravenous Q12H  . sucralfate  1 g Oral Q6H    Continuous Infusions: . amiodarone 30 mg/hr (02/09/18 1737)  . diltiazem (CARDIZEM) infusion 5 mg/hr (02/09/18 1737)  . fluconazole (DIFLUCAN) IV Stopped (02/09/18 1343)  . heparin 1,400 Units/hr (02/09/18 1737)  . meropenem (MERREM) IV Stopped (02/09/18 1058)    PRN Meds: acetaminophen **OR** acetaminophen, albuterol, ALPRAZolam, food thickener, iohexol, morphine injection, ondansetron (ZOFRAN) IV  Physical Exam        Elderly gentleman, mildly confused, no distress. CV tachy resp no distress Abdomen firm, moderately distended Upper extremities swollen and cool  Vital Signs: BP (!) 112/41   Pulse (!) 112   Temp 97.7 F (36.5 C) (Axillary)   Resp (!) 24    Ht 5\' 9"  (1.753 m)   Wt 87.6 kg   SpO2 94%   BMI 28.52 kg/m  SpO2: SpO2: 94 % O2 Device: O2 Device: Nasal Cannula O2 Flow Rate: O2 Flow Rate (L/min): 4 L/min  Intake/output summary:   Intake/Output Summary (Last 24 hours) at 02/09/2018 1800 Last data filed at 02/09/2018 1737 Gross per 24 hour  Intake 1423.08 ml  Output 920 ml  Net 503.08 ml   LBM: Last BM Date: 02/08/18 Baseline Weight: Weight: 75.8 kg Most recent weight: Weight: 87.6 kg       Palliative Assessment/Data:  30%    Flowsheet Rows     Most Recent Value  Intake Tab  Referral Department  Hospitalist  Unit at Time of Referral  ICU  Date Notified  02/06/18  Palliative Care Type  New Palliative care  Reason for referral  Clarify Goals of Care  Date of Admission  01/31/18  # of days IP prior to Palliative referral  6  Clinical Assessment  Psychosocial & Spiritual Assessment  Palliative Care Outcomes      Patient Active Problem List   Diagnosis Date Noted  . Palliative care encounter   . Perforated duodenal ulcer (HCC)   . Dysphagia, oral phase   . Palliative care by specialist   . Encounter for hospice care discussion   . AKI (acute kidney injury) (HCC)   . Hypotension due to hypovolemia   . Perforated peptic ulcer (HCC) 01/31/2018  . Elevated troponin 01/31/2018  . Pleural effusion 01/31/2018  . HTN (hypertension) 01/31/2018  . Insomnia 09/20/2017  . Venous insufficiency 09/20/2017  . COPD (chronic obstructive pulmonary disease) (HCC) 12/07/2016  . Greater trochanteric bursitis of right hip 03/05/2015  . Esophageal achalasia 11/13/2014  . Obstructive chronic bronchitis without exacerbation (HCC) 11/26/2012  . Hyperlipidemia 07/19/2007  . Elevated blood pressure (not hypertension) 07/19/2007    Palliative Care Plan    Recommendations/Plan:  No artificial feeding.  No TPN/TNA  Family is interested in discussing option of residential hospice.  Patient's children are going to discuss this  evening.  Plan to follow-up tomorrow.  Code Status:  DNR  Prognosis:  With recurrent aspiration, sustained a-fib, PVT, and an inability take in enough nutrition and hydration to sustain himself, his prognosis with focus on comfort only is likely weeks at best.  Discharge Planning:  To Be Determined  Care plan was discussed with Dr. Sunnie Nielsen, Manuel Reilly at bedside.  Thank you for allowing the Palliative Medicine Team to assist in the care of this patient.  Total time spent:  25 min.     Greater than 50%  of this time was spent counseling and coordinating care related to the above assessment and plan. Romie Minus, MD [facility] Palliative Medicine Team [phone]   Please contact Palliative MedicineTeam phone at [phone] for questions and concerns between 7 am - 7 pm.   Please see AMION for individual provider pager numbers.

## 2018-02-09 NOTE — Progress Notes (Signed)
ANTICOAGULATION CONSULT NOTE - Follow Up Consult  Pharmacy Consult for Heparin Indication: Portal vein thrombosis  Allergies  Allergen Reactions  . Penicillins Anaphylaxis, Hives, Shortness Of Breath and Rash    Has patient had a PCN reaction causing immediate rash, facial/tongue/throat swelling, SOB or lightheadedness with hypotension: Yes Has patient had a PCN reaction causing severe rash involving mucus membranes or skin necrosis: Yes Has patient had a PCN reaction that required hospitalization Yes Has patient had a PCN reaction occurring within the last 10 years: No If all of the above answers are "NO", then may proceed with Cephalosporin use.   . Procaine Hcl Anaphylaxis and Swelling  . Wasp Venom Swelling    Swelling of throat and swelling at the site     Patient Measurements: Height: 5\' 9"  (175.3 cm) Weight: 193 lb 2 oz (87.6 kg) IBW/kg (Calculated) : 70.7 Heparin Dosing Weight: 70.7 kg  Vital Signs: Temp: 98.2 F (36.8 C) (12/06 0800) Temp Source: Oral (12/06 0800) BP: 95/38 (12/06 0800) Pulse Rate: 114 (12/06 0800)  Labs: Recent Labs    02/07/18 0433  02/08/18 0108 02/08/18 0905 02/08/18 2200 02/09/18 0835  HGB  --    < > QUESTIONABLE RESULTS, RECOMMEND RECOLLECT TO VERIFY 14.6  QUESTIONABLE RESULTS, RECOMMEND RECOLLECT TO VERIFY  --  14.7  HCT  --    < > QUESTIONABLE RESULTS, RECOMMEND RECOLLECT TO VERIFY 44.4  QUESTIONABLE RESULTS, RECOMMEND RECOLLECT TO VERIFY  --  43.6  PLT  --    < > QUESTIONABLE RESULTS, RECOMMEND RECOLLECT TO VERIFY 193  QUESTIONABLE RESULTS, RECOMMEND RECOLLECT TO VERIFY  --  210  HEPARINUNFRC  --   --  <0.10*  --  <0.10* 0.50  CREATININE 1.05  --  0.98  --   --   --    < > = values in this interval not displayed.    Estimated Creatinine Clearance: 52.7 mL/min (by C-G formula based on SCr of 0.98 mg/dL).   Medications:  Scheduled:  . feeding supplement  1 Container Oral TID BM  . ipratropium-albuterol  3 mL Nebulization Q6H WA   . mouth rinse  15 mL Mouth Rinse BID  . pantoprazole  40 mg Intravenous Q12H  . sucralfate  1 g Oral Q6H   Infusions:  . amiodarone 30 mg/hr (02/09/18 0800)  . diltiazem (CARDIZEM) infusion 5 mg/hr (02/09/18 0800)  . fluconazole (DIFLUCAN) IV Stopped (02/08/18 1249)  . heparin 1,450 Units/hr (02/09/18 0800)  . meropenem (MERREM) IV Stopped (02/08/18 2321)   PRN: acetaminophen **OR** acetaminophen, albuterol, ALPRAZolam, food thickener, iohexol, morphine injection, ondansetron (ZOFRAN) IV  Assessment: [name] with perforated peptic ulcer, new-onset AFIB, initially not anticoagulated due to perforation. On 12/4 abd CT with portal vein thrombosis.  Pharmacy consulted to dose IV heparin.  Today, 02/09/2018  Heparin level therapeutic (0.5) on heparin 1450 units/hr  CBC: Hgb stable 14.7, Plts wnl  No bleeding reported  Goal of Therapy:  Heparin level 0.3-0.7 units/ml - no heparin boluses per MD Monitor platelets by anticoagulation protocol: Yes   Plan:   Continue heparin drip at 1450 units/hr  Recheck heparin level in 8hrs to verify therapeutic  Daily heparin level and CBC  Follow up plans for long-term anticoagulation  Loralee PacasErin Garyn Arlotta, PharmD, BCPS Pager: [phone] 02/09/2018,8:54 AM

## 2018-02-10 LAB — HEPARIN LEVEL (UNFRACTIONATED): Heparin Unfractionated: <0.1 IU/mL — ABNORMAL LOW (ref 0.30–0.70)

## 2018-02-10 LAB — BASIC METABOLIC PANEL
ANION GAP: 9 (ref 5–15)
BUN: 50 mg/dL — ABNORMAL HIGH (ref 8–23)
CO2: 23 mmol/L (ref 22–32)
Calcium: 8.2 mg/dL — ABNORMAL LOW (ref 8.9–10.3)
Chloride: 110 mmol/L (ref 98–111)
Creatinine, Ser: 1.51 mg/dL — ABNORMAL HIGH (ref 0.61–1.24)
GFR calc non Af Amer: 40 mL/min — ABNORMAL LOW (ref >60)
GFR, EST AFRICAN AMERICAN: 46 mL/min — AB (ref >60)
Glucose, Bld: 142 mg/dL — ABNORMAL HIGH (ref 70–99)
Potassium: 3.6 mmol/L (ref 3.5–5.1)
Sodium: 142 mmol/L (ref 135–145)

## 2018-02-10 LAB — CULTURE, BLOOD (ROUTINE X 2)
Culture: NO GROWTH
Culture: NO GROWTH

## 2018-02-10 LAB — CBC
HCT: 45.7 % (ref 39.0–52.0)
HEMOGLOBIN: 15.3 g/dL (ref 13.0–17.0)
MCH: 29.6 pg (ref 26.0–34.0)
MCHC: 33.5 g/dL (ref 30.0–36.0)
MCV: 88.4 fL (ref 80.0–100.0)
Platelets: 242 10*3/uL (ref 150–400)
RBC: 5.17 MIL/uL (ref 4.22–5.81)
RDW: 18.3 % — ABNORMAL HIGH (ref 11.5–15.5)
WBC: 29.8 10*3/uL — ABNORMAL HIGH (ref 4.0–10.5)
nRBC: 0.1 % (ref 0.0–0.2)

## 2018-02-10 MED ORDER — LORAZEPAM 2 MG/ML PO CONC: 1.0000 mg | ORAL | Status: DC | PRN

## 2018-02-10 MED ORDER — HEPARIN (PORCINE) 25000 UT/250ML-% IV SOLN
1450.0000 [IU]/h | INTRAVENOUS | Status: DC
  Administered 2018-02-10: 1450 [IU]/h via INTRAVENOUS

## 2018-02-10 MED ORDER — ONDANSETRON HCL 4 MG/2ML IJ SOLN: 4.0000 mg | Freq: Four times a day (QID) | INTRAMUSCULAR | Status: DC | PRN

## 2018-02-10 MED ORDER — IPRATROPIUM-ALBUTEROL 0.5-2.5 (3) MG/3ML IN SOLN
3.0000 mL | Freq: Three times a day (TID) | RESPIRATORY_TRACT | Status: DC
  Administered 2018-02-11: 3 mL via RESPIRATORY_TRACT
  Filled 2018-02-10: qty 3

## 2018-02-10 MED ORDER — HALOPERIDOL LACTATE 5 MG/ML IJ SOLN: 0.5000 mg | INTRAMUSCULAR | Status: DC | PRN

## 2018-02-10 MED ORDER — HALOPERIDOL LACTATE 2 MG/ML PO CONC
0.5000 mg | ORAL | Status: DC | PRN
  Filled 2018-02-10: qty 0.3

## 2018-02-10 MED ORDER — GLYCOPYRROLATE 1 MG PO TABS: 1.0000 mg | ORAL_TABLET | ORAL | Status: DC | PRN

## 2018-02-10 MED ORDER — FENTANYL CITRATE (PF) 100 MCG/2ML IJ SOLN
25.0000 ug | INTRAMUSCULAR | Status: DC | PRN
  Administered 2018-02-10 – 2018-02-11 (×2): 25 ug via INTRAVENOUS
  Filled 2018-02-10 (×3): qty 2

## 2018-02-10 MED ORDER — ONDANSETRON 4 MG PO TBDP: 4.0000 mg | ORAL_TABLET | Freq: Four times a day (QID) | ORAL | Status: DC | PRN

## 2018-02-10 MED ORDER — HALOPERIDOL 1 MG PO TABS: 0.5000 mg | ORAL_TABLET | ORAL | Status: DC | PRN

## 2018-02-10 MED ORDER — LORAZEPAM 2 MG/ML IJ SOLN: 1.0000 mg | INTRAMUSCULAR | Status: DC | PRN

## 2018-02-10 MED ORDER — GLYCOPYRROLATE 0.2 MG/ML IJ SOLN: 0.2000 mg | INTRAMUSCULAR | Status: DC | PRN

## 2018-02-10 MED ORDER — LORAZEPAM 1 MG PO TABS: 1.0000 mg | ORAL_TABLET | ORAL | Status: DC | PRN

## 2018-02-10 NOTE — Progress Notes (Signed)
Daily Progress Note   Patient Name: Manuel Reilly       Date: 02/10/2018 DOB: [birth year]  Age: 84 y.o. MRN#: [id number] Attending Physician: Alba Cory, MD Primary Care Physician: [name], MD Admit Date: 01/31/2018  Reason for Consultation/Follow-up: Establishing goals of care and Psychosocial/spiritual support  Subjective: Spoke with daughter Manuel Reilly) via phone and his other 2 daughters and a grandson in person.   Values and goals of care important to patient and family were attempted to be elicited.  Family shared stories of Manuel Reilly strength and dedication to leading his family.  We discussed his clinical course as well continued decline with tachycardia, portal vein thrombosis, increasing white count, and worsening renal function. We discussed difference between a aggressive medical intervention path and a palliative, comfort focused care path.   Family expressed prior hope that he would improve, but are now seeing that with his continued decline, his body is shutting down regardless of interventions.  They believe that his desire, if he were to understand his situation, would be to focus on comfort. They express that he would want to be at home, but this in not a possibility.  We discussed potential placement at residential hospice, and family believes that this would be his preference.  It is important that he be out of the hospital when he dies as he feels like he is "in prison" in the hospital.   Length of Stay: 10  Current Medications: Scheduled Meds:  . ipratropium-albuterol  3 mL Nebulization Q6H WA  . mouth rinse  15 mL Mouth Rinse BID  . pantoprazole  40 mg Intravenous Q12H    Continuous Infusions: . diltiazem (CARDIZEM) infusion 5 mg/hr  (02/09/18 1800)    PRN Meds: acetaminophen **OR** acetaminophen, albuterol, ALPRAZolam, fentaNYL (SUBLIMAZE) injection, glycopyrrolate **OR** glycopyrrolate **OR** glycopyrrolate, haloperidol **OR** haloperidol **OR** haloperidol lactate, LORazepam **OR** LORazepam **OR** LORazepam, ondansetron **OR** ondansetron (ZOFRAN) IV  Physical Exam        Elderly gentleman, confused, no distress but appears much weaker and less interactive. CV tachy resp no distress Abdomen firm, moderately distended Upper extremities swollen and cool  Vital Signs: BP (!) 93/37   Pulse (!) 108   Temp (!) 96.7 F (35.9 C) (Axillary)   Resp 17   Ht 5\' 9"  (1.753 m)  Wt 88 kg   SpO2 96%   BMI 28.65 kg/m  SpO2: SpO2: 96 % O2 Device: O2 Device: Nasal Cannula O2 Flow Rate: O2 Flow Rate (L/min): 4 L/min  Intake/output summary:   Intake/Output Summary (Last 24 hours) at 02/10/2018 1517 Last data filed at 02/10/2018 [id number] Gross per 24 hour  Intake 620.56 ml  Output 451 ml  Net 169.56 ml   LBM: Last BM Date: 02/08/18 Baseline Weight: Weight: 75.8 kg Most recent weight: Weight: 88 kg       Palliative Assessment/Data: 30%    Flowsheet Rows     Most Recent Value  Intake Tab  Referral Department  Hospitalist  Unit at Time of Referral  ICU  Date Notified  02/06/18  Palliative Care Type  New Palliative care  Reason for referral  Clarify Goals of Care  Date of Admission  01/31/18  # of days IP prior to Palliative referral  6  Clinical Assessment  Psychosocial & Spiritual Assessment  Palliative Care Outcomes      Patient Active Problem List   Diagnosis Date Noted  . Abdomen enlarged   . Palliative care encounter   . Perforated duodenal ulcer (HCC)   . Dysphagia, oral phase   . Palliative care by specialist   . Encounter for hospice care discussion   . AKI (acute kidney injury) (HCC)   . Hypotension due to hypovolemia   . Perforated peptic ulcer (HCC) 01/31/2018  . Elevated troponin  01/31/2018  . Pleural effusion 01/31/2018  . HTN (hypertension) 01/31/2018  . Insomnia 09/20/2017  . Venous insufficiency 09/20/2017  . COPD (chronic obstructive pulmonary disease) (HCC) 12/07/2016  . Greater trochanteric bursitis of right hip 03/05/2015  . Esophageal achalasia 11/13/2014  . Obstructive chronic bronchitis without exacerbation (HCC) 11/26/2012  . Hyperlipidemia 07/19/2007  . Elevated blood pressure (not hypertension) 07/19/2007    Palliative Care Plan    Recommendations/Plan:  No artificial feeding.  No TPN/TNA  Family understands that he is dying and are hopeful that he can be transitioned to residential hospice for end of life care.  Comfort care orders placed with order set.  I did d/c abx and heparin, but left cardizem as hope to keep him stable enough to transfer from the hospital if he is accepted to residential hospice.  Code Status:  DNR  Prognosis:   He has been changing clinically and is weaker every day for the last 3 days.  With recurrent aspiration, sustained a-fib, PVT, worsening kidney function, and an inability take in enough nutrition and hydration to sustain himself, his prognosis with focus on comfort only is less than 2 weeks.  He would be best served by transition to residential hospice for end of life care if it can be arranged.  Discharge Planning:  Hospice facility  Care plan was discussed with Dr. Sunnie Nielsenegalado, Dr. Michaell CowingGross, family, bedside RN.  Thank you for allowing the Palliative Medicine Team to assist in the care of this patient.  Total time spent:  45 min.     Greater than 50%  of this time was spent counseling and coordinating care related to the above assessment and plan. Romie MinusGene Vipul Cafarelli, MD [facility] Palliative Medicine Team [phone]   Please contact Palliative MedicineTeam phone at [phone] for questions and concerns between 7 am - 7 pm.   Please see AMION for individual provider pager numbers.

## 2018-02-10 NOTE — Progress Notes (Signed)
AUSENCIO VADEN [id number] 82-Mar-1925  CARE TEAM:  PCP: [name], MD  Outpatient Care Team: Patient Care Team: [name], MD as PCP - General (Internal Medicine) Jake Bathe, MD as PCP - Cardiology (Cardiology)  Inpatient Treatment Team: Treatment Team: Attending Provider: Alba Cory, MD; Consulting Physician: Montez Morita Md, MD; Consulting Physician: Ovidio Kin, MD; Rounding Team: Massie Kluver, MD   Problem List:   Principal Problem:   Perforated peptic ulcer (HCC) Active Problems:   COPD (chronic obstructive pulmonary disease) (HCC)   Elevated troponin   Pleural effusion   HTN (hypertension)   Palliative care encounter   Perforated duodenal ulcer (HCC)   Dysphagia, oral phase   Palliative care by specialist   Encounter for hospice care discussion   Abdomen enlarged       Assessment  Perforated gastric/duodenalulcer:Hx of esophageal impaction/Duodenal inflamation 09/2017  [facility] Stay = 10 days)  Plan:  Further deterioration with worsening renal failure, increasing leukocytosis, persistent tachycardia.  Despite aggressive intervention with ICU care, IV antibiotics, full anticoagulation, aggressive cardiac stabilization; he has continued to decline.  With his advanced age and significant comorbidities, we have been following nonoperatively.  Unfortunately, I think he is deteriorating further.  Family's has been involved & aware.  My understanding is that most of the daughters are ready to consider hospice but one was hoping for some improvements.  I had a frank discussion with one of the daughters about my concerns & his objective and clinical decline despite aggressive ICU intervention.  I agree with recommendations by cardiology and palliative care to more strongly consider hospice.  The daughter was obviously tearful but not surprised.  Patient more focused with back pain and acute issues.  His daughter will discuss further with  the primary palliative care team & family.  Surgery will follow peripherally from this point.  Call with questions  25 minutes spent in review, evaluation, examination, counseling, and coordination of care.  More than 50% of that time was spent in counseling.  02/10/2018    Subjective: (Chief complaint)  Daughter at bedside anxious but doting.  Critical care nursing team just left room.  Patient sitting up, complaining of back pain.  Objective:  Vital signs:  Vitals:   02/10/18 0400 02/10/18 0500 02/10/18 0600 02/10/18 0811  BP: (!) 105/37 (!) 102/37 (!) 93/37   Pulse: (!) 107 (!) 108 (!) 108   Resp: 19 17 17    Temp: (!) 96.7 F (35.9 C)     TempSrc: Axillary     SpO2: 94% 95% 96% 97%  Weight:  88 kg    Height:        Last BM Date: 02/08/18  Intake/Output   Yesterday:  12/06 0701 - 12/07 0700 In: 1248.6 [P.O.:240; I.V.:858.6; IV Piggyback:150] Out: 451 [Urine:450; Stool:1] This shift:  No intake/output data recorded.  Bowel function:  Flatus: No  BM:  No  Drain: (No drain)   Physical Exam:  General: Pt awake/alert/oriented x4 in moderate acute distress Eyes: PERRL, normal EOM.  Sclera clear.  No icterus Neuro: CN II-XII intact w/o focal sensory/motor deficits.  Rambling/mumbling.  Complaining of back pain.  Following some commands. Lymph: No head/neck/groin lymphadenopathy Psych:  No delerium/psychosis/paranoia HENT: Normocephalic, Mucus membranes moist.  No thrush Neck: Supple, No tracheal deviation Chest: No chest wall pain w good excursion CV:  Pulses intact.  Regular rhythm MS: Normal AROM mjr joints.  No obvious deformity  Abdomen: Somewhat firm.  Moderately distended.  Upper  abdominal tenderness..  No incarcerated hernias.  Ext:   No deformity.   No cyanosis Skin: No petechiae / purpura  Results:   Labs: Results for orders placed or performed during the hospital encounter of 01/31/18 (from the past 48 hour(s))  Heparin level  (unfractionated)     Status: Abnormal   Collection Time: 02/08/18 10:00 PM  Result Value Ref Range   Heparin Unfractionated <0.10 (L) 0.30 - 0.70 IU/mL    Comment: (NOTE) If heparin results are below expected values, and patient dosage has  been confirmed, suggest follow up testing of antithrombin III levels. Performed at [facility], [address] [place], [place] [address]   CBC     Status: Abnormal   Collection Time: 02/09/18  8:35 AM  Result Value Ref Range   WBC 22.2 (H) 4.0 - 10.5 K/uL   RBC 4.89 4.22 - 5.81 MIL/uL   Hemoglobin 14.7 13.0 - 17.0 g/dL   HCT 40.943.6 81.139.0 - 91.452.0 %   MCV 89.2 80.0 - 100.0 fL   MCH 30.1 26.0 - 34.0 pg   MCHC 33.7 30.0 - 36.0 g/dL   RDW 78.217.5 (H) 95.611.5 - 21.315.5 %   Platelets 210 150 - 400 K/uL   nRBC 0.1 0.0 - 0.2 %    Comment: Performed at [facility], [address] [place], [place] [address]  Basic metabolic panel     Status: Abnormal   Collection Time: 02/09/18  8:35 AM  Result Value Ref Range   Sodium 140 135 - 145 mmol/L   Potassium 4.3 3.5 - 5.1 mmol/L    Comment: DELTA CHECK NOTED REPEATED TO VERIFY NO VISIBLE HEMOLYSIS    Chloride 110 98 - 111 mmol/L   CO2 22 22 - 32 mmol/L   Glucose, Bld 136 (H) 70 - 99 mg/dL   BUN 44 (H) 8 - 23 mg/dL   Creatinine, Ser 8.461.14 0.61 - 1.24 mg/dL   Calcium 7.7 (L) 8.9 - 10.3 mg/dL   GFR calc non Af Amer 56 (L) >60 mL/min   GFR calc Af Amer >60 >60 mL/min   Anion gap 8 5 - 15    Comment: Performed at [facility], [address] [place], [place] [address]  Magnesium     Status: None   Collection Time: 02/09/18  8:35 AM  Result Value Ref Range   Magnesium 2.2 1.7 - 2.4 mg/dL    Comment: Performed at [facility], [address] [place], [place] [address]  Heparin level (unfractionated)     Status: None   Collection Time: 02/09/18  8:35 AM  Result Value Ref Range   Heparin Unfractionated 0.50 0.30 - 0.70 IU/mL    Comment:  (NOTE) If heparin results are below expected values, and patient dosage has  been confirmed, suggest follow up testing of antithrombin III levels. Performed at [facility], [address] [place], [place] [address]   Heparin level (unfractionated)     Status: None   Collection Time: 02/09/18  3:46 PM  Result Value Ref Range   Heparin Unfractionated 0.62 0.30 - 0.70 IU/mL    Comment: (NOTE) If heparin results are below expected values, and patient dosage has  been confirmed, suggest follow up testing of antithrombin III levels. Performed at [facility], [address] [place], [place] [address]   Heparin level (unfractionated)     Status: Abnormal   Collection Time: 02/10/18  3:02 AM  Result Value Ref Range   Heparin Unfractionated <0.10 (L) 0.30 - 0.70 IU/mL    Comment: RESULT CHECKED (NOTE) If heparin results are below expected values, and patient dosage has  been confirmed, suggest follow up testing of antithrombin III levels. Performed at [facility], [address], [place], [place] [address]   CBC     Status: Abnormal   Collection Time: 02/10/18  7:04 AM  Result Value Ref Range   WBC 29.8 (H) 4.0 - 10.5 K/uL   RBC 5.17 4.22 - 5.81 MIL/uL   Hemoglobin 15.3 13.0 - 17.0 g/dL   HCT 60.4 54.0 - 98.1 %   MCV 88.4 80.0 - 100.0 fL   MCH 29.6 26.0 - 34.0 pg   MCHC 33.5 30.0 - 36.0 g/dL   RDW 19.1 (H) 47.8 - 29.5 %   Platelets 242 150 - 400 K/uL   nRBC 0.1 0.0 - 0.2 %    Comment: Performed at [facility], [address], [place], [place] [address]  Basic metabolic panel     Status: Abnormal   Collection Time: 02/10/18  7:41 AM  Result Value Ref Range   Sodium 142 135 - 145 mmol/L   Potassium 3.6 3.5 - 5.1 mmol/L   Chloride 110 98 - 111 mmol/L   CO2 23 22 - 32 mmol/L   Glucose, Bld 142 (H) 70 - 99 mg/dL   BUN 50 (H) 8 - 23 mg/dL   Creatinine, Ser 8.65 (H) 0.61 - 1.24 mg/dL   Calcium 8.2 (L)  8.9 - 10.3 mg/dL   GFR calc non Af Amer 40 (L) >60 mL/min   GFR calc Af Amer 46 (L) >60 mL/min   Anion gap 9 5 - 15    Comment: Performed at [facility], [address], [place], [place] [address]    Imaging / Studies: Dg Abd 1 View  Result Date: 02/09/2018 CLINICAL DATA:  Abdominal pain and distension. EXAM: ABDOMEN - 1 VIEW COMPARISON:  Abdomen pelvis CT dated 02/07/2018. FINDINGS: Dilated proximal small bowel loops with mild overall improvement and normal caliber distal small bowel loops. Gas and contrast in mildly dilated colon. The stomach remains mildly dilated with a small amount of contrast remaining. Excreted contrast in the urinary bladder. Small bilateral pleural effusions. Lumbar and thoracic spine degenerative changes. IMPRESSION: 1. Mildly improved partial small bowel obstruction. 2. Stable probable colonic ileus. 3. Small bilateral pleural effusions. Electronically Signed   By: Beckie Salts M.D.   On: 02/09/2018 10:09   Dg Chest Port 1 View  Result Date: 02/08/2018 CLINICAL DATA:  84 year old male with shortness of breath. Recent portal venous thrombus, suspected perforated peptic ulcer. EXAM: PORTABLE CHEST 1 VIEW COMPARISON:  02/05/2018 and earlier. CT Abdomen and Pelvis 02/07/2018. FINDINGS: Portable AP semi upright view at 0847 hours. Layering and partially loculated pleural effusions demonstrated at the lung bases yesterday by CT with atelectasis. Superimposed gas trapping in the left lower lobe. Stable ventilation since 02/05/2018. No pneumothorax. Stable cardiac size and mediastinal contours. Retained barium type contrast in the left upper abdomen. IMPRESSION: Bilateral pleural effusions, partially loculated on the left and with gas trapping in the left lower lobe. Ventilation is stable since 02/05/2018. Electronically Signed   By: Odessa Fleming M.D.   On: 02/08/2018 10:16    Medications / Allergies: per chart  Antibiotics: Anti-infectives (From admission,  onward)   Start     Dose/Rate Route Frequency Ordered Stop   02/08/18 1200  fluconazole (DIFLUCAN) IVPB 100 mg     100 mg 50 mL/hr over 60 Minutes Intravenous Every 24 hours 02/08/18 1012     02/07/18 1800  meropenem (MERREM) 1 g in sodium chloride 0.9 % 100 mL IVPB     1 g 200 mL/hr over 30 Minutes Intravenous Every 12 hours 02/07/18 1557     02/05/18 1430  aztreonam (AZACTAM) 1 g in sodium chloride 0.9 % 100 mL IVPB  Status:  Discontinued     1 g 200 mL/hr over 30 Minutes Intravenous Every 8 hours 02/05/18 1419 02/07/18 1550   02/01/18 1400  ciprofloxacin (CIPRO) IVPB 400 mg  Status:  Discontinued     400 mg 200 mL/hr over 60 Minutes Intravenous Every 12 hours 02/01/18 0210 02/05/18 1420   02/01/18 0600  metroNIDAZOLE (FLAGYL) IVPB 500 mg  Status:  Discontinued     500 mg 100 mL/hr over 60 Minutes Intravenous Every 8 hours 01/31/18 2318 02/07/18 1550   01/31/18 2130  ciprofloxacin (CIPRO) IVPB 400 mg     400 mg 200 mL/hr over 60 Minutes Intravenous  Once 01/31/18 2123 02/01/18 0256   01/31/18 2130  metroNIDAZOLE (FLAGYL) IVPB 500 mg     500 mg 100 mL/hr over 60 Minutes Intravenous  Once 01/31/18 2123 02/01/18 0138        Note: Portions of this report may have been transcribed using voice recognition software. Every effort was made to ensure accuracy; however, inadvertent computerized transcription errors may be present.   Any transcriptional errors that result from this process are unintentional.     Ardeth Sportsman, MD, FACS, MASCRS Gastrointestinal and Minimally Invasive Surgery    [address], [address] [place], [place] [address] [phone] Main / Paging [phone] Fax

## 2018-02-10 NOTE — Progress Notes (Signed)
CSW consulted for residential hospice placement [facility]. [facility]. Sent referral to hospice liaison, Manuel CousinLisa Reilly, who will f/u with family about eligibility.  Manuel CutterLindsey Daymion Reilly, MSW, LCSWA Clinical Social Work [phone]

## 2018-02-10 NOTE — Progress Notes (Signed)
PROGRESS NOTE    Manuel Reilly  ZOX:[id number] DOB: 1924/01/18 DOA: 01/31/2018 PCP: [name], MD    Brief Narrative: 84 year old with past medical history significant for diverticulosis, inguinal hernia repair, colon polyps, COPD who presented with abdominal pain for the last 6 months pain got really worse over the last 2 days prior to admission.  CT abdomen and pelvis show highly suspicious for for perforated peptic  ulcer with a small foci of extraluminal air in the porta hepatis.  Patient was admitted to the stepdown unit.  he was started on IV fluid, IV antibiotics.  He was evaluated by surgery, who recommended conservative management.  Surgery continue to follow on patient.  Patient also developed A. fib with RVR.  He was evaluated by cardiology.  Patient was a started on amiodarone infusion, IV Cardizem.  Not on anticoagulation due to perforation.  Patient developed worsening lactic acidosis, continues to have leukocytosis on 02/07/2018.  Surgery order a CT abdomen and pelvis; thrombosis of the portal vein. Wall thickening of gastric antrum and proximal duodenum is noted with an increased amount of fluid in this area and in the porta hepatis region, most consistent with peptic ulcer disease. There is no free air or pneumoperitoneum is noted at this time.  Dilation of the small bowel concerning with ileus versus a small bowel obstruction.    Assessment & Plan:   Principal Problem:   Perforated peptic ulcer (HCC) Active Problems:   COPD (chronic obstructive pulmonary disease) (HCC)   Elevated troponin   Pleural effusion   HTN (hypertension)   Palliative care encounter   Perforated duodenal ulcer (HCC)   Dysphagia, oral phase   Palliative care by specialist   Encounter for hospice care discussion   Abdomen enlarged  Perforated peptic ulcer/ileus; Patient was admitted to the stepdown unit.  NG tube was placed to suction.  He was a started on IV PPI.  Patient remove NG  on 11-28. Patient on clear diet. He developed lactic acidosis, persistent leukocytosis. CT abdomen and pelvis repeated 12;04; showed thrombosis of portal vein, wall thickening of gastric antrum and proximal duodenum with an increase amount of fluid in this area in the porta hepatis region, most consistent with peptic ulcer disease. Surgery following. IV antibiotics changed to carbapenem on 02/07/2018.  WBC still elevated.  Patient complaining of worsening abdominal pain today.  Abdomen appears to be more distended.  KUB showed persistent he continues colonic distention.  Discuss with surgery, pain and abdominal distention and small bowel dilation may be related to portal vein thrombosis. White count is worse today, abdomen is still distended. Patient continued to decline despite aggressive care.  Recommendations is for patient to be transitioned to hospice care.  Family will discuss further with palliative care.   Acute hypoxic respiratory failure;  Chest x ray showed pleural effusion partially loculated.  Suspect also aspiration pneumonia. Started on meropenem 12-04. Receive dose of IV lasix 12-06.  Will hold on Lasix, due to worsening renal function.  Lactic acidosis; Concern with infectious process. Received IV bolus.  CT abdomen negative for abscess.   Portal vein thrombosis;  Discussed with surgery, ok to start anticoagulation. No Bolus.  Continue with heparin Gtt.    Hypokalemia; improved,. Oral supplement.  Hypomagnesemia; replaced.   Metabolic acidosis; improved On sodium bicarb amp IV x4 doses.  Bilateral lower extremity edema; volume overload. Has received PRN IV Lasix.  Thrombocytopenia; From acute illness, consumption. Monitor.  Normalized  Transaminases. Normalize.  Delirium, acute  metabolic encephalopathy; Related to acute illness. CT head negative for acute finding. He has required as needed Ativan.  Oral candidiasis.  Start fluconazole.   Dysphagia;    At risk For aspiration.  AKI;  Difficulty related to diuretics, also increased intra-abdominal pressure from abdominal distention. Hold on IV Lasix today.  RN Pressure Injury Documentation:    Malnutrition Type:  Nutrition Problem: Inadequate protein intake Etiology: other (see comment)(current diet order)   Malnutrition Characteristics:  Signs/Symptoms: other (comment)(CLD does not meet estimated protein need)   Nutrition Interventions:  Interventions: Refer to RD note for recommendations  Estimated body mass index is 28.65 kg/m as calculated from the following:   Height as of this encounter: 5\' 9"  (1.753 m).   Weight as of this encounter: 88 kg.   DVT prophylaxis: SCDs Code Status: DNR Family Communication: Daughter at bedside.  Family considering hospice  Disposition Plan: Remain in the stepdown unit, IV antibiotics, management of lactic acidosis.  Consultants:   Surgery  Cardiology   Procedures:  None   Antimicrobials:  Aztreonam 12-02  Flagyl 11-28   Subjective: Patient complaining of back pain, he appears more weak  today.  Objective: Vitals:   02/10/18 0400 02/10/18 0500 02/10/18 0600 02/10/18 0811  BP: (!) 105/37 (!) 102/37 (!) 93/37   Pulse: (!) 107 (!) 108 (!) 108   Resp: 19 17 17    Temp: (!) 96.7 F (35.9 C)     TempSrc: Axillary     SpO2: 94% 95% 96% 97%  Weight:  88 kg    Height:        Intake/Output Summary (Last 24 hours) at 02/10/2018 1258 Last data filed at 02/10/2018 0657 Gross per 24 hour  Intake 1056.32 ml  Output 451 ml  Net 605.32 ml   Filed Weights   02/06/18 0943 02/07/18 0500 02/10/18 0500  Weight: 87.4 kg 87.6 kg 88 kg    Examination:  General exam; ill appearing Respiratory system; bilateral rhonchus on crackles. Cardiovascular system: S1, S2 irregular rhythm rate  Gastrointestinal system: Bowel sounds present, soft nontender nondistended Central nervous system: Sleepy, weak, complaining of back  pain. Extremities: +2 edema     Data Reviewed: I have personally reviewed following labs and imaging studies  CBC: Recent Labs  Lab 02/04/18 0316 02/05/18 0356  02/07/18 0635 02/08/18 0108 02/08/18 0905 02/09/18 0835 02/10/18 0704  WBC 11.6* 20.1*   < > 19.6* QUESTIONABLE RESULTS, RECOMMEND RECOLLECT TO VERIFY 22.1*  QUESTIONABLE RESULTS, RECOMMEND RECOLLECT TO VERIFY 22.2* 29.8*  NEUTROABS 9.9* 17.3*  --  16.8*  --   --   --   --   HGB 12.8* 14.5   < > 14.5 QUESTIONABLE RESULTS, RECOMMEND RECOLLECT TO VERIFY 14.6  QUESTIONABLE RESULTS, RECOMMEND RECOLLECT TO VERIFY 14.7 15.3  HCT 39.0 44.3   < > 42.7 QUESTIONABLE RESULTS, RECOMMEND RECOLLECT TO VERIFY 44.4  QUESTIONABLE RESULTS, RECOMMEND RECOLLECT TO VERIFY 43.6 45.7  MCV 90.9 90.4   < > 87.0 QUESTIONABLE RESULTS, RECOMMEND RECOLLECT TO VERIFY 90.4  QUESTIONABLE RESULTS, RECOMMEND RECOLLECT TO VERIFY 89.2 88.4  PLT 105* 136*   < > 171 QUESTIONABLE RESULTS, RECOMMEND RECOLLECT TO VERIFY 193  QUESTIONABLE RESULTS, RECOMMEND RECOLLECT TO VERIFY 210 242   < > = values in this interval not displayed.   Basic Metabolic Panel: Recent Labs  Lab 02/04/18 0316  02/06/18 0258 02/07/18 0433 02/08/18 0108 02/09/18 0835 02/10/18 0741  NA 134*   < > 139 142 141 140 142  K 3.5   < >  4.3 3.2* 3.5 4.3 3.6  CL 110   < > 112* 116* 115* 110 110  CO2 17*   < > 16* 21* 19* 22 23  GLUCOSE 125*   < > 119* 136* 142* 136* 142*  BUN 29*   < > 35* 41* 40* 44* 50*  CREATININE 0.90   < > 0.94 1.05 0.98 1.14 1.51*  CALCIUM 7.7*   < > 7.8* 6.5* 7.0* 7.7* 8.2*  MG 2.1  --  2.1 1.6* 2.0 2.2  --    < > = values in this interval not displayed.   GFR: Estimated Creatinine Clearance: 34.3 mL/min (A) (by C-G formula based on SCr of 1.51 mg/dL (H)). Liver Function Tests: No results for input(s): AST, ALT, ALKPHOS, BILITOT, PROT, ALBUMIN in the last 168 hours. No results for input(s): LIPASE, AMYLASE in the last 168 hours. No results for input(s):  AMMONIA in the last 168 hours. Coagulation Profile: No results for input(s): INR, PROTIME in the last 168 hours. Cardiac Enzymes: No results for input(s): CKTOTAL, CKMB, CKMBINDEX, TROPONINI in the last 168 hours. BNP (last 3 results) No results for input(s): PROBNP in the last 8760 hours. HbA1C: No results for input(s): HGBA1C in the last 72 hours. CBG: Recent Labs  Lab 02/07/18 0741  GLUCAP 122*   Lipid Profile: No results for input(s): CHOL, HDL, LDLCALC, TRIG, CHOLHDL, LDLDIRECT in the last 72 hours. Thyroid Function Tests: No results for input(s): TSH, T4TOTAL, FREET4, T3FREE, THYROIDAB in the last 72 hours. Anemia Panel: No results for input(s): VITAMINB12, FOLATE, FERRITIN, TIBC, IRON, RETICCTPCT in the last 72 hours. Sepsis Labs: Recent Labs  Lab 02/07/18 1608 02/07/18 2053 02/08/18 0108 02/08/18 0905  LATICACIDVEN 2.6* 2.3* 2.9* 2.4*    Recent Results (from the past 240 hour(s))  MRSA PCR Screening     Status: None   Collection Time: 02/01/18  3:00 PM  Result Value Ref Range Status   MRSA by PCR NEGATIVE NEGATIVE Final    Comment:        The GeneXpert MRSA Assay (FDA approved for NASAL specimens only), is one component of a comprehensive MRSA colonization surveillance program. It is not intended to diagnose MRSA infection nor to guide or monitor treatment for MRSA infections. Performed at [facility], [address], [place], [place] [address]   Culture, blood (routine x 2)     Status: None (Preliminary result)   Collection Time: 02/05/18  9:40 AM  Result Value Ref Range Status   Specimen Description   Final    BLOOD RIGHT HAND Performed at [facility], [address], [place], [place] [address]    Special Requests   Final    BOTTLES DRAWN AEROBIC ONLY Blood Culture results may not be optimal due to an inadequate volume of blood received in culture bottles Performed at [facility], [address] [place], [place] [address]    Culture   Final    NO GROWTH 4 DAYS Performed at [facility], [address] [place], [place] [address]    Report Status PENDING  Incomplete  Culture, blood (routine x 2)     Status: None (Preliminary result)   Collection Time: 02/05/18  9:40 AM  Result Value Ref Range Status   Specimen Description   Final    BLOOD RIGHT HAND Performed at [facility], [address], [place], [place] [address]    Special Requests   Final  BOTTLES DRAWN AEROBIC ONLY Blood Culture results may not be optimal due to an inadequate volume of blood received in culture bottles Performed at [facility], [address] [place], [place] [address]    Culture   Final    NO GROWTH 4 DAYS Performed at [facility], [address] [place], [place] [address]    Report Status PENDING  Incomplete         Radiology Studies: Dg Abd 1 View  Result Date: 02/09/2018 CLINICAL DATA:  Abdominal pain and distension. EXAM: ABDOMEN - 1 VIEW COMPARISON:  Abdomen pelvis CT dated 02/07/2018. FINDINGS: Dilated proximal small bowel loops with mild overall improvement and normal caliber distal small bowel loops. Gas and contrast in mildly dilated colon. The stomach remains mildly dilated with a small amount of contrast remaining. Excreted contrast in the urinary bladder. Small bilateral pleural effusions. Lumbar and thoracic spine degenerative changes. IMPRESSION: 1. Mildly improved partial small bowel obstruction. 2. Stable probable colonic ileus. 3. Small bilateral pleural effusions. Electronically Signed   By: Beckie SaltsSteven  Reid M.D.   On: 02/09/2018 10:09        Scheduled Meds: . feeding supplement  1 Container Oral TID BM  . ipratropium-albuterol  3 mL Nebulization Q6H WA  . mouth rinse  15 mL Mouth Rinse BID  . pantoprazole  40 mg Intravenous Q12H  . sucralfate  1 g Oral Q6H   Continuous Infusions: . amiodarone 30 mg/hr  (02/10/18 0658)  . diltiazem (CARDIZEM) infusion 5 mg/hr (02/09/18 1800)  . fluconazole (DIFLUCAN) IV 100 mg (02/10/18 1142)  . heparin 1,450 Units/hr (02/10/18 0748)  . meropenem (MERREM) IV 1 g (02/10/18 1024)     LOS: 10 days    Time spent: 35 minutes.     Alba CoryBelkys A Banesa Tristan, MD [facility] Pager [phone]  If 7PM-7AM, please contact night-coverage [email or web address] Password [email or web address] 02/10/2018, 12:58 PM

## 2018-02-10 NOTE — Progress Notes (Signed)
[facility] Liaison RN note @5 :00pm  Received request from CSW Lillia Abed(Lindsay) for family interest in [facility] with request for transfer. Chart reviewed and spoke with Dr. Neale BurlyFreeman.  Spoke with Daughter Marisue Humble(Vicki Math) to confirm interest and explain services. Family agreeable to transfer tomorrow after 1100. CSW Lillia Abed(Lindsay) aware.  Registration paper work to be completed prior to transfer.   Thank you.   Gerri SporeLisa Matthews,RN, BSN [facility] Liaison  [phone]   [facility] Liasions are on AMION

## 2018-02-10 NOTE — Progress Notes (Signed)
ANTICOAGULATION CONSULT NOTE - Follow Up Consult  Pharmacy Consult for Heparin Indication: Portal vein thrombosis  Allergies  Allergen Reactions  . Penicillins Anaphylaxis, Hives, Shortness Of Breath and Rash    Has patient had a PCN reaction causing immediate rash, facial/tongue/throat swelling, SOB or lightheadedness with hypotension: Yes Has patient had a PCN reaction causing severe rash involving mucus membranes or skin necrosis: Yes Has patient had a PCN reaction that required hospitalization Yes Has patient had a PCN reaction occurring within the last 10 years: No If all of the above answers are "NO", then may proceed with Cephalosporin use.   . Procaine Hcl Anaphylaxis and Swelling  . Wasp Venom Swelling    Swelling of throat and swelling at the site     Patient Measurements: Height: 5\' 9"  (175.3 cm) Weight: 194 lb 0.1 oz (88 kg) IBW/kg (Calculated) : 70.7 Heparin Dosing Weight: 70.7 kg  Vital Signs: Temp: 96.7 F (35.9 C) (12/07 0400) Temp Source: Axillary (12/07 0400) BP: 93/37 (12/07 0600) Pulse Rate: 108 (12/07 0600)  Labs: Recent Labs    02/08/18 0108 02/08/18 0905  02/09/18 0835 02/09/18 1546 02/10/18 0302  HGB QUESTIONABLE RESULTS, RECOMMEND RECOLLECT TO VERIFY 14.6  QUESTIONABLE RESULTS, RECOMMEND RECOLLECT TO VERIFY  --  14.7  --   --   HCT QUESTIONABLE RESULTS, RECOMMEND RECOLLECT TO VERIFY 44.4  QUESTIONABLE RESULTS, RECOMMEND RECOLLECT TO VERIFY  --  43.6  --   --   PLT QUESTIONABLE RESULTS, RECOMMEND RECOLLECT TO VERIFY 193  QUESTIONABLE RESULTS, RECOMMEND RECOLLECT TO VERIFY  --  210  --   --   HEPARINUNFRC <0.10*  --    < > 0.50 0.62 <0.10*  CREATININE 0.98  --   --  1.14  --   --    < > = values in this interval not displayed.    Estimated Creatinine Clearance: 45.4 mL/min (by C-G formula based on SCr of 1.14 mg/dL).  Assessment: [name] with perforated peptic ulcer, new-onset AFIB, initially not anticoagulated due to perforation. On 12/4  abd CT with portal vein thrombosis.  Pharmacy consulted to dose IV heparin.  Today, 02/10/2018  Repeat Heparin level now undetectable (<0.1) on heparin 1400 units/hr - questioning the validity of this since 2 therapeutic levels yesterday on rate of 1450 units/hr  CBC: not done this AM  No bleeding reported  Goal of Therapy:  Heparin level 0.3-0.7 units/ml - no heparin boluses per MD Monitor platelets by anticoagulation protocol: Yes   Plan:   Increase heparin drip back to 1450 units/hr  Recheck heparin level and CBC in 8hrs  Daily heparin level and CBC  Follow up plans for long-term anticoagulation pending goals of care  Loralee PacasErin Kyleen Villatoro, PharmD, BCPS Pager: [phone] 02/10/2018 7:07 AM

## 2018-02-11 MED ORDER — GLYCOPYRROLATE 1 MG PO TABS: 1.0000 mg | ORAL_TABLET | ORAL | 0 refills | Status: AC | PRN

## 2018-02-11 MED ORDER — IPRATROPIUM-ALBUTEROL 0.5-2.5 (3) MG/3ML IN SOLN: 3.0000 mL | Freq: Three times a day (TID) | RESPIRATORY_TRACT | 0 refills | Status: AC

## 2018-02-11 MED ORDER — FENTANYL CITRATE (PF) 100 MCG/2ML IJ SOLN: 25.0000 ug | INTRAMUSCULAR | 0 refills | Status: AC | PRN

## 2018-02-11 MED ORDER — MORPHINE SULFATE (CONCENTRATE) 10 MG/0.5ML PO SOLN: 5.0000 mg | ORAL | 0 refills | Status: AC | PRN

## 2018-02-11 MED ORDER — HALOPERIDOL LACTATE 2 MG/ML PO CONC: 0.5000 mg | ORAL | 0 refills | Status: AC | PRN

## 2018-02-11 MED ORDER — ALPRAZOLAM 0.25 MG PO TABS: 0.2500 mg | ORAL_TABLET | Freq: Every evening | ORAL | 0 refills | Status: AC | PRN

## 2018-02-11 MED ORDER — ALBUTEROL SULFATE (2.5 MG/3ML) 0.083% IN NEBU: 2.5000 mg | INHALATION_SOLUTION | RESPIRATORY_TRACT | 12 refills | Status: AC | PRN

## 2018-02-11 MED ORDER — MORPHINE SULFATE (CONCENTRATE) 10 MG/0.5ML PO SOLN
5.0000 mg | ORAL | Status: DC | PRN
  Administered 2018-02-11 (×2): 5 mg via ORAL
  Filled 2018-02-11 (×2): qty 0.5

## 2018-02-11 MED ORDER — LORAZEPAM 2 MG/ML PO CONC: 1.0000 mg | ORAL | 0 refills | Status: AC | PRN

## 2018-02-11 NOTE — Progress Notes (Signed)
Called to the floor by RN that family was at the bedside and upset.  I returned to the floor and met with family in conjunction with staff including charge RN.  Family expressed concern regarding his documented low pressure (MAP 35) and that they feel that he has not gotten care that he needs.  Family was specifically upset that his cardizem and heparin had been stopped.  I let them know that I had ordered to stop heparin last night as he lost one of his IV access points and I did not feel that the benefit of continuing with heparin (at this point with a goal of comfort) outweighed the burden of him needing to receive another IV.  It appears that cardizem was stopped sometime overnight when his other access was lost.  On my exam, Mr. Dittmar is actively dying, but he is still able to track and nod his head but inconsistently.  - We discussed again regarding overall goals of being out of the hospital and being comfortable.      - I offered to cancel transport if family desired due to concern of him possibly dying in transport.  Plan would then be for a hospital death.  - Family expressed that they are clear in desire to transition to [facility] and understand the risk of him dying in transport.  It is clear the his desire would be to be out of the hospital, even if he dies in transport.  - I provided number for Office of Patient Experience to family due to their expressed concerns about care he has recieved.  They were given a Patient Guide with section on raising concerns highlighted.  We also let family know that Unit Director would be available this week to discuss concerns as well if family desires.  Micheline Rough, MD [place] [facility] Team [phone]  NO CHARGE NOTE

## 2018-02-11 NOTE — Progress Notes (Signed)
Called in by PTAR due to vital check per protocol revealing BP 77/17. Family voiced concerns over BP and care that patient had been receiving during hospital stay. Family members were tearful about a "lack of care" that the patient had been getting. The were specifically concerned about patient not receiving cardiac medications through IV, lack of IV access, and patient moving into uncomfortable positions in the bed and not being repositioned more comfortably "until we came out and asked". RN explained that the patient had oral medications ordered for comfort measures and that after lack of access through the night, they decided that poking him with IVs would be more discomfort for the patient (this was discussed with family during nightshift when acces was lost, per day shift RN). Charge RN tried to explain that sometimes patients will move themselves into positions that are more comfortable for themselves even if it appears to be uncomfortable to everyone else, and if the patient doesn't appear to be in any discomfort or distress and continues to move back into the same position, the RN will intervene less frequently but still as appropriate. RN contacted palliative MD to help facilitate conversation. Family continued to verbalize that they were very dissatisfied with the care that there father had received. They expressed that they were upset that the patient was in the hospital for the amount of time that he was, without seeing improvement in his condition. They stated "if I would have known they were going to put him through this and not make him better, I would have just taken him home instead of him being cared for as less than human". Family did verbalize that they understood "exactly what's happening" as far as patient actively dying. They stated they fully comprehended what comfort care was all about and that they wanted to get the patient "home" (patient being [facility]) and comfortable  as soon as possible. Palliative MD discussed IV access medication clarification with family and transport [facility]. Family agreed to continue with transfer to hospice. MD provided number for Office of Patient Experience to family and let family know that unit director would be available during the week if they wished to speak with her.

## 2018-02-11 NOTE — Progress Notes (Signed)
Patient was in process of transferring with [facility]. PTAR checked routine vitals prior to transferring. Patient's BP was hypotensive. Patient is comfort care and [facility] is [facility]. Three daughters present, and very upset with certain aspects of care. "Stating that patient was not taken care of". Family members have not had any issues today with care, family has been pleasant and did not any concerns today when care was being provided by bedside RN and other staff entering the room on many attempts. MD Neale BurlyFreeman paged to bedside to discuss and reassure the current plan in [facility]. Family was in agreement with [facility] and understands that patient is dying. MD Neale BurlyFreeman offered and provided number for Office of Patient Experience to the family and also explained to them to call the ICU/SD Leadership and Management tomorrow as well. Emotional support was given to family during this difficult time.

## 2018-02-11 NOTE — Progress Notes (Addendum)
Patient discharging [facility]. CSW faxed d/c summary and prepared d/c packet. PTAR has been called for transport. Patient's family aware.  RN call report: [phone]  CSW signing off.  Enid CutterLindsey Saniah Schroeter, MSW, LCSWA Clinical Social Work [phone]

## 2018-02-11 NOTE — Care Management Important Message (Signed)
Important Message  Patient Details  Name: Manuel CashHoward H Eke MRN: [id number] Date of Birth: Aug 16, 1923   Medicare Important Message Given:  Yes    Elliot CousinShavis, Marquail Bradwell Ellen, RN 02/11/2018, 9:33 AM

## 2018-02-11 NOTE — Progress Notes (Signed)
Daily Progress Note   Patient Name: Manuel Reilly       Date: 02/11/2018 DOB: 1923-05-28  Age: 84 y.o. MRN#: [id number] Attending Physician: Alba Coryegalado, Belkys A, MD Primary Care Physician: Myrlene Brokerrawford, Elizabeth A, MD Admit Date: 01/31/2018  Reason for Consultation/Follow-up: Establishing goals of care and Psychosocial/spiritual support  Subjective: Dicussed with daughters as well as [facility] liaison.  [facility]  I saw and examined Manuel Reilly. He tracked me in the room, but attempted to mouth only one or two words.  Length of Stay: 11  Current Medications: Scheduled Meds:  . ipratropium-albuterol  3 mL Nebulization TID  . mouth rinse  15 mL Mouth Rinse BID  . pantoprazole  40 mg Intravenous Q12H    Continuous Infusions: . diltiazem (CARDIZEM) infusion Stopped (02/11/18 0628)    PRN Meds: acetaminophen **OR** acetaminophen, albuterol, ALPRAZolam, fentaNYL (SUBLIMAZE) injection, glycopyrrolate **OR** glycopyrrolate **OR** glycopyrrolate, haloperidol **OR** haloperidol **OR** haloperidol lactate, LORazepam **OR** LORazepam **OR** LORazepam, morphine CONCENTRATE, ondansetron **OR** ondansetron (ZOFRAN) IV  Physical Exam        Elderly gentleman, confused, no distress, less interactive than yesterday. CV tachy resp no distress Abdomen firm, moderately distended Upper extremities swollen and cool  Vital Signs: BP (!) 105/48 (BP Location: Left Arm)   Pulse (!) 105   Temp (!) 96.4 F (35.8 C) (Axillary)   Resp 18   Ht 5\' 9"  (1.753 m)   Wt 88 kg   SpO2 98%   BMI 28.65 kg/m  SpO2: SpO2: (comfort care) O2 Device: O2 Device: Nasal Cannula O2 Flow Rate: O2 Flow Rate (L/min): 4 L/min  Intake/output summary:   Intake/Output Summary (Last 24 hours) at 02/11/2018  1150 Last data filed at 02/11/2018 0600 Gross per 24 hour  Intake 610.89 ml  Output 50 ml  Net 560.89 ml   LBM: Last BM Date: 02/08/18 Baseline Weight: Weight: 75.8 kg Most recent weight: Weight: 88 kg       Palliative Assessment/Data: 30%    Flowsheet Rows     Most Recent Value  Intake Tab  Referral Department  Hospitalist  Unit at Time of Referral  ICU  Date Notified  02/06/18  Palliative Care Type  New Palliative care  Reason for referral  Clarify Goals of Care  Date of Admission  01/31/18  # of  days IP prior to Palliative referral  6  Clinical Assessment  Psychosocial & Spiritual Assessment  Palliative Care Outcomes      Patient Active Problem List   Diagnosis Date Noted  . Abdomen enlarged   . Palliative care encounter   . Perforated duodenal ulcer (HCC)   . Dysphagia, oral phase   . Palliative care by specialist   . Encounter for hospice care discussion   . AKI (acute kidney injury) (HCC)   . Hypotension due to hypovolemia   . Perforated peptic ulcer (HCC) 01/31/2018  . Elevated troponin 01/31/2018  . Pleural effusion 01/31/2018  . HTN (hypertension) 01/31/2018  . Insomnia 09/20/2017  . Venous insufficiency 09/20/2017  . COPD (chronic obstructive pulmonary disease) (HCC) 12/07/2016  . Greater trochanteric bursitis of right hip 03/05/2015  . Esophageal achalasia 11/13/2014  . Obstructive chronic bronchitis without exacerbation (HCC) 11/26/2012  . Hyperlipidemia 07/19/2007  . Elevated blood pressure (not hypertension) 07/19/2007    Palliative Care Plan    Recommendations/Plan: Plan for transition to [facility] for EOL care.  Code Status:  DNR  Prognosis:   < 2 weeks  Discharge Planning:  Hospice facility  Thank you for allowing the Palliative Medicine Team to assist in the care of this patient.  Total time spent:  15 min.     Greater than 50%  of this time was spent counseling and coordinating care related to the above assessment and  plan. Romie Minus, MD [facility] Palliative Medicine Team [phone]   Please contact Palliative MedicineTeam phone at [phone] for questions and concerns between 7 am - 7 pm.   Please see AMION for individual provider pager numbers.

## 2018-02-11 NOTE — Discharge Summary (Signed)
Physician Discharge Summary  Manuel Reilly BJY:[id number] DOB: Aug 04, 1923 DOA: 01/31/2018  PCP: [name], MD  Admit date: 01/31/2018 Discharge date: 02/11/2018  Admitted From: Home  Disposition:  Residential Hospice facility   Recommendations for Outpatient Follow-up:  1. Comfort care.    Discharge Condition: Hospice CODE STATUS: DNR Diet recommendation: Comfort feeding.   Brief/Interim Summary: Brief Narrative: 84 year old with past medical history significant for diverticulosis, inguinal hernia repair, colon polyps, COPD who presented with abdominal pain for the last 6 months pain got really worse over the last 2 days prior to admission.  CT abdomen and pelvis show highly suspicious for for perforated peptic  ulcer with a small foci of extraluminal air in the porta hepatis.  Patient was admitted to the stepdown unit.  he was started on IV fluid, IV antibiotics.  He was evaluated by surgery, who recommended conservative management.  Surgery continue to follow on patient.  Patient also developed A. fib with RVR.  He was evaluated by cardiology.  Patient was a started on amiodarone infusion, IV Cardizem.  Not on anticoagulation due to perforation.  Patient developed worsening lactic acidosis, continues to have leukocytosis on 02/07/2018.  Surgery order a CT abdomen and pelvis; thrombosis of the portal vein. Wall thickening of gastric antrum and proximal duodenum is noted with an increased amount of fluid in this area and in the porta hepatis region, most consistent with peptic ulcer disease. There is no free air or pneumoperitoneum is noted at this time.  Dilation of the small bowel concerning with ileus versus a small bowel obstruction.    Patient develop portal vein thrombosis, treated with heparin Gtt. Develop worsening abdominal distension and worsening leukocytosi. He continue to decline. Family agree with comfort care and transfer to [facility] place.    Assessment &  Plan:   Principal Problem:   Perforated peptic ulcer (HCC) Active Problems:   COPD (chronic obstructive pulmonary disease) (HCC)   Elevated troponin   Pleural effusion   HTN (hypertension)   Palliative care encounter   Perforated duodenal ulcer (HCC)   Dysphagia, oral phase   Palliative care by specialist   Encounter for hospice care discussion   Abdomen enlarged  Perforated peptic ulcer/ileus; Patient was admitted to the stepdown unit.  NG tube was placed to suction.  He was a started on IV PPI.  Patient remove NG on 11-28. Patient on clear diet. He developed lactic acidosis, persistent leukocytosis. CT abdomen and pelvis repeated 12;04; showed thrombosis of portal vein, wall thickening of gastric antrum and proximal duodenum with an increase amount of fluid in this area in the porta hepatis region, most consistent with peptic ulcer disease. Surgery following. IV antibiotics changed to carbapenem on 02/07/2018.  Patient complaining of worsening abdominal pain today.  Abdomen appears to be more distended.  KUB showed persistent he continues colonic distention.  Discuss with surgery, pain and abdominal distention and small bowel dilation may be related to portal vein thrombosis. White count continue to increase, abdomen is still distended. Patient continued to decline despite aggressive care.  Recommendations is for patient to be transitioned to hospice care.  Family will discuss further with palliative care. Family now agrees with comfort care. Plan to transfer to [facility] place.  -Comfort care orders in place.   Acute hypoxic respiratory failure;  Chest x ray showed pleural effusion partially loculated.  Suspect also aspiration pneumonia. Started on meropenem 12-04. Receive dose of IV lasix 12-06.  Holding  Lasix, due to worsening renal  function.  Lactic acidosis; Concern with infectious process. Received IV bolus.  CT abdomen negative for abscess.   Portal vein thrombosis;   Discussed with surgery, ok to start anticoagulation. No Bolus.  Treated  with heparin Gtt. Worsening abdominal distension despite heparin Gtt.    Hypokalemia; improved,. Oral supplement.  Hypomagnesemia; replaced.   Metabolic acidosis;  Received sodium bicarb amp IV x4 doses.  Bilateral lower extremity edema; volume overload. Has received PRN IV Lasix.  Thrombocytopenia; From acute illness, consumption. Monitor.  Normalized  Transaminases. Normalize.  Delirium, acute metabolic encephalopathy; Related to acute illness. CT head negative for acute finding. He has required as needed Ativan.  Oral candidiasis.  Started  fluconazole.   Dysphagia;  At risk For aspiration.  AKI;  Difficulty related to diuretics, also increased intra-abdominal pressure from abdominal distention. Hold lasix.  Worsening renal function.     Discharge Diagnoses:  Principal Problem:   Perforated peptic ulcer (HCC) Active Problems:   COPD (chronic obstructive pulmonary disease) (HCC)   Elevated troponin   Pleural effusion   HTN (hypertension)   Palliative care encounter   Perforated duodenal ulcer (HCC)   Dysphagia, oral phase   Palliative care by specialist   Encounter for hospice care discussion   Abdomen enlarged    Discharge Instructions  Discharge Instructions    Diet - low sodium heart healthy   Complete by:  As directed    Increase activity slowly   Complete by:  As directed      Allergies as of 02/11/2018      Reactions   Penicillins Anaphylaxis, Hives, Shortness Of Breath, Rash   Has patient had a PCN reaction causing immediate rash, facial/tongue/throat swelling, SOB or lightheadedness with hypotension: Yes Has patient had a PCN reaction causing severe rash involving mucus membranes or skin necrosis: Yes Has patient had a PCN reaction that required hospitalization Yes Has patient had a PCN reaction occurring within the last 10 years: No If all of the above  answers are "NO", then may proceed with Cephalosporin use.   Procaine Hcl Anaphylaxis, Swelling   Wasp Venom Swelling   Swelling of throat and swelling at the site       Medication List    STOP taking these medications   acetaminophen 500 MG tablet Commonly known as:  TYLENOL   albuterol 108 (90 Base) MCG/ACT inhaler Commonly known as:  PROVENTIL HFA;VENTOLIN HFA Replaced by:  albuterol (2.5 MG/3ML) 0.083% nebulizer solution     TAKE these medications   albuterol (2.5 MG/3ML) 0.083% nebulizer solution Commonly known as:  PROVENTIL Take 3 mLs (2.5 mg total) by nebulization every 4 (four) hours as needed for wheezing or shortness of breath. Replaces:  albuterol 108 (90 Base) MCG/ACT inhaler   ALPRAZolam 0.25 MG tablet Commonly known as:  XANAX Take 1 tablet (0.25 mg total) by mouth at bedtime as needed for anxiety. What changed:    reasons to take this  additional instructions   fentaNYL 100 MCG/2ML injection Commonly known as:  SUBLIMAZE Inject 0.5 mLs (25 mcg total) into the vein every 30 (thirty) minutes as needed for severe pain (or dyspnea).   glycopyrrolate 1 MG tablet Commonly known as:  ROBINUL Take 1 tablet (1 mg total) by mouth every 4 (four) hours as needed (excessive secretions).   haloperidol 2 MG/ML solution Commonly known as:  HALDOL Place 0.3 mLs (0.6 mg total) under the tongue every 4 (four) hours as needed for agitation (or delirium).   ipratropium-albuterol 0.5-2.5 (3)  MG/3ML Soln Commonly known as:  DUONEB Take 3 mLs by nebulization 3 (three) times daily.   LORazepam 2 MG/ML concentrated solution Commonly known as:  ATIVAN Place 0.5 mLs (1 mg total) under the tongue every 4 (four) hours as needed for anxiety.   morphine CONCENTRATE 10 MG/0.5ML Soln concentrated solution Take 0.25 mLs (5 mg total) by mouth every 2 (two) hours as needed for severe pain.   pantoprazole 20 MG tablet Commonly known as:  PROTONIX Take 1 tablet (20 mg total) by  mouth daily.       Allergies  Allergen Reactions  . Penicillins Anaphylaxis, Hives, Shortness Of Breath and Rash    Has patient had a PCN reaction causing immediate rash, facial/tongue/throat swelling, SOB or lightheadedness with hypotension: Yes Has patient had a PCN reaction causing severe rash involving mucus membranes or skin necrosis: Yes Has patient had a PCN reaction that required hospitalization Yes Has patient had a PCN reaction occurring within the last 10 years: No If all of the above answers are "NO", then may proceed with Cephalosporin use.   . Procaine Hcl Anaphylaxis and Swelling  . Wasp Venom Swelling    Swelling of throat and swelling at the site     Consultations:  Surgery  Palliative     Procedures/Studies: Dg Chest 2 View  Result Date: 01/31/2018 CLINICAL DATA:  Upper abdominal pain EXAM: CHEST - 2 VIEW COMPARISON:  09/09/2017 FINDINGS: Small pleural effusions. No focal consolidation. Scarring in the right lower lobe. Stable cardiomediastinal silhouette. No pneumothorax. Lucency in the left upper quadrant thought to reflect gas within the gastric fundus. IMPRESSION: Small bilateral pleural effusions Electronically Signed   By: Jasmine Pang M.D.   On: 01/31/2018 18:03   Dg Abd 1 View  Result Date: 02/09/2018 CLINICAL DATA:  Abdominal pain and distension. EXAM: ABDOMEN - 1 VIEW COMPARISON:  Abdomen pelvis CT dated 02/07/2018. FINDINGS: Dilated proximal small bowel loops with mild overall improvement and normal caliber distal small bowel loops. Gas and contrast in mildly dilated colon. The stomach remains mildly dilated with a small amount of contrast remaining. Excreted contrast in the urinary bladder. Small bilateral pleural effusions. Lumbar and thoracic spine degenerative changes. IMPRESSION: 1. Mildly improved partial small bowel obstruction. 2. Stable probable colonic ileus. 3. Small bilateral pleural effusions. Electronically Signed   By: Beckie Salts M.D.    On: 02/09/2018 10:09   Ct Head Wo Contrast  Result Date: 02/04/2018 CLINICAL DATA:  Disorientation. EXAM: CT HEAD WITHOUT CONTRAST TECHNIQUE: Contiguous axial images were obtained from the base of the skull through the vertex without intravenous contrast. COMPARISON:  None. FINDINGS: Brain: There is no evidence of acute infarct, intracranial hemorrhage, mass, midline shift, or extra-axial fluid collection. There is a chronic lacunar infarct in the left caudate nucleus. Scattered cerebral white matter hypodensities are nonspecific but compatible with mild chronic small vessel ischemic disease. Mild cerebral atrophy is not greater than expected for age. Vascular: Calcified atherosclerosis at the skull base. No hyperdense vessel. Skull: No fracture or suspicious osseous lesion. Sinuses/Orbits: No acute findings. Bilateral cataract extraction. Other: None. IMPRESSION: 1. No evidence of acute intracranial abnormality. 2. Mild chronic small vessel ischemic disease. Electronically Signed   By: Sebastian Ache M.D.   On: 02/04/2018 15:31   Ct Abdomen Pelvis W Contrast  Result Date: 02/07/2018 CLINICAL DATA:  History of perforated ulcer. EXAM: CT ABDOMEN AND PELVIS WITH CONTRAST TECHNIQUE: Multidetector CT imaging of the abdomen and pelvis was performed using the standard protocol  following bolus administration of intravenous contrast. CONTRAST:  OMNIPAQUE IOHEXOL 300 MG/ML  SOLN COMPARISON:  CT scan of January 31, 2018. FINDINGS: Lower chest: Moderate loculated pleural effusions are noted bilaterally with adjacent atelectasis. Hepatobiliary: Gallbladder is dilated. No definite gallstones are noted. Stable left hepatic cyst is noted. 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 Increased amount of fluid is noted in the porta hepatis region. Pancreas: Fatty replacement of the pancreas is again noted. No definite inflammation or ductal dilatation is noted. Spleen: Normal in size without focal abnormality.  Adrenals/Urinary Tract: Adrenal glands appear normal. Stable right renal cyst is noted. No hydronephrosis or renal obstruction is noted. No renal or ureteral calculi are noted. Urinary bladder is decompressed secondary to Foley catheter. Stomach/Bowel: Stable gastric distention is noted. Wall thickening is seen involving the gastric antrum and proximal duodenum with surrounding fluid concerning for peptic ulcer disease. No free air is noted at this time. Dilated small bowel loops are noted concerning for ileus or distal small bowel obstruction. Diverticulosis of proximal sigmoid colon is noted. There is no evidence of colonic dilatation. The appendix is not clearly visualized. Vascular/Lymphatic: Aortic atherosclerosis. No enlarged abdominal or pelvic lymph nodes. Reproductive: Mild prostatic calcifications are noted. Other: Mild anasarca is noted. Fat containing left inguinal hernia is noted. Musculoskeletal: No acute or significant osseous findings. IMPRESSION: Thrombosis of main portal vein is noted. These results will be called to the ordering clinician or representative by the Radiologist Assistant, and communication documented in the PACS or zVision Dashboard. Wall thickening of gastric antrum and proximal duodenum is noted with an increased amount of fluid in this area and in the porta hepatis region, most consistent with peptic ulcer disease. There is no free air or pneumoperitoneum is noted at this time. Small bowel dilatation is noted concerning for ileus or distal small bowel obstruction. Fatty replacement of the pancreas is again noted. Mild anasarca. Sigmoid diverticulosis without inflammation. Moderate bilateral loculated pleural effusions are noted with adjacent atelectasis. Aortic Atherosclerosis (ICD10-I70.0). Electronically Signed   By: Lupita Raider, M.D.   On: 02/07/2018 12:24   Ct Abdomen Pelvis W Contrast  Result Date: 01/31/2018 CLINICAL DATA:  84 year old with acute abdominal pain.  EXAM: CT ABDOMEN AND PELVIS WITH CONTRAST TECHNIQUE: Multidetector CT imaging of the abdomen and pelvis was performed using the standard protocol following bolus administration of intravenous contrast. CONTRAST:  ISOVUE-300 IOPAMIDOL (ISOVUE-300) INJECTION 61% COMPARISON:  CT 09/09/2017 FINDINGS: Lower chest: Unchanged ovoid density in the left lower lobe consistent with round atelectasis. Bibasilar scarring. Subpleural bleb in the right lower lobe small right pleural effusion is new. There are coronary artery calcifications. Hepatobiliary: 15 mm cyst in the left lobe of the liver. Minimal gallbladder wall thickening and adjacent stranding is likely reactive secondary to gastric/duodenal inflammation, similar to slightly improved from prior exam. No calcified gallstone. No biliary dilatation. Pancreas: Fatty atrophy.  No ductal dilatation or inflammation. Spleen: Normal in size without focal abnormality. Adrenals/Urinary Tract: Normal adrenal glands. No hydronephrosis or perinephric edema. Homogeneous renal enhancement. Absent renal excretion on delayed phase imaging suggesting underlying renal dysfunction. Symmetric age-related renal atrophy. 12 mm cyst from the lower right kidney. Urinary bladder is physiologically distended without wall thickening. Stomach/Bowel: Stomach distended with air-fluid level. Peri pyloric wall thickening of the distal stomach and duodenum with mucosal enhancement and perigastric/peri duodenal inflammation. Small foci of extraluminal air in the porta hepatis consistent perforation. Regional stranding and free fluid without organized abscess. Duodenal diverticulum about  the fourth portion again seen without inflammation. Remaining small bowel is unremarkable. No obstruction. Mild distal descending and sigmoid diverticulosis without diverticulitis. Appendix is not confidently visualized. Vascular/Lymphatic: Aorta bi-iliac atherosclerosis. No aneurysm. Circumaortic left renal vein. No  adenopathy. Reproductive: Prostatic calcifications. Other: Foci of air in the right upper quadrant of the abdomen in the porta hepatis consistent with bowel perforation. Right upper quadrant inflammatory change and free fluid without organized abscess. Small amount of free fluid tracks into the pelvis. Left inguinal canal contains fat and small amount of free fluid. Prior right inguinal hernia repair. Right scrotal hydroceles partially included. Musculoskeletal: Multilevel degenerative change throughout spine. Unchanged grade 1 retrolisthesis of L3 on L4, degenerative. There are no acute or suspicious osseous abnormalities. IMPRESSION: 1. Findings highly suspicious for perforated peptic ulcer with small foci of extraluminal air in the porta hepatis and right upper quadrant, wall thickening of the peri pyloric stomach and proximal duodenum. No organized abscess. 2. Mild gallbladder wall thickening and adjacent stranding is likely reactive secondary to gastric/duodenal inflammation, similar to slightly improved from prior exam. 3. Colonic diverticulosis without diverticulitis. 4. Absent renal excretion on delayed phase imaging consistent with underlying renal dysfunction. 5. Small right pleural effusion. 6. Emphysema at the lung bases with round atelectasis in the left lower lobe. Emphysema (ICD10-J43.9). 7.  Aortic Atherosclerosis (ICD10-I70.0). Critical Value/emergent results were called by telephone at the time of interpretation on 01/31/2018 at 9:22 pm to Dr. Pricilla Loveless , who verbally acknowledged these results. Electronically Signed   By: Narda Rutherford M.D.   On: 01/31/2018 21:24   Dg Chest Port 1 View  Result Date: 02/08/2018 CLINICAL DATA:  84 year old male with shortness of breath. Recent portal venous thrombus, suspected perforated peptic ulcer. EXAM: PORTABLE CHEST 1 VIEW COMPARISON:  02/05/2018 and earlier. CT Abdomen and Pelvis 02/07/2018. FINDINGS: Portable AP semi upright view at 0847 hours.  Layering and partially loculated pleural effusions demonstrated at the lung bases yesterday by CT with atelectasis. Superimposed gas trapping in the left lower lobe. Stable ventilation since 02/05/2018. No pneumothorax. Stable cardiac size and mediastinal contours. Retained barium type contrast in the left upper abdomen. IMPRESSION: Bilateral pleural effusions, partially loculated on the left and with gas trapping in the left lower lobe. Ventilation is stable since 02/05/2018. Electronically Signed   By: Odessa Fleming M.D.   On: 02/08/2018 10:16   Dg Chest Port 1 View  Result Date: 02/05/2018 CLINICAL DATA:  Hypoxia.  Right pleural effusion.  Emphysema. EXAM: PORTABLE CHEST 1 VIEW COMPARISON:  01/31/2018 FINDINGS: Heart size remains normal. Today's film is lordotically positioned. Question increased density in the lower lobes bilaterally which could go along with some worsened atelectasis. Background scarring and emphysematous change as seen previously. IMPRESSION: Question worsened atelectasis in both lower lungs. Lordotically position film. Electronically Signed   By: Paulina Fusi M.D.   On: 02/05/2018 09:50   Dg Abd 2 Views  Result Date: 02/01/2018 CLINICAL DATA:  Perforated ulcer on recent CT EXAM: ABDOMEN - 2 VIEW COMPARISON:  01/31/2018 FINDINGS: Scattered large and small bowel gas is noted. Some early small-bowel dilatation is noted. This may represent an overall ileus as opposed to true small-bowel obstruction. Correlation with the physical exam is recommended. Contrast material is noted within the bladder from the recent CT examination. No definitive free air is seen. IMPRESSION: No free air identified. Generalized small bowel dilatation likely representing an ileus. Correlation with the physical exam is recommended. Continued follow-up is recommended. Electronically Signed   By:  Alcide Clever M.D.   On: 02/01/2018 07:49   Dg Vangie Bicker G Tube Plc W/fl W/rad  Result Date: 02/01/2018 CLINICAL DATA:   84 year old with a perforated peptic ulcer. Patient needs a nasogastric tube and unable to place without imaging. EXAM: NASO G TUBE PLACEMENT WITH FL AND WITH RAD CONTRAST:  None FLUOROSCOPY TIME:  Fluoroscopy Time:  1 minutes and 16 seconds Radiation Exposure Index (if provided by the fluoroscopic device): 11.5 mGy COMPARISON:  None. FINDINGS: Lidocaine gel was placed in both nostrils using cotton swabs. Nasogastric tube was advanced down the left nostril with the patient was sitting upright. Once the tube was thought to be within the esophagus, the patient was moved on to the fluoroscopic table. The tube was advanced down to the GE junction. It was difficult to advance the catheter beyond the GE junction and fluoroscopy demonstrate that the tube was coiling in the patient's mouth. Stiff Amplatz wire was advanced through the NG tube and this helped advance the catheter into the stomach. Catheter was secured to the skin with tape. Nasogastric tube tip confirmed in the gastric fundus region with fluoroscopy. Fluoroscopic images were taken and saved for this procedure. IMPRESSION: Successful placement of a nasogastric tube with fluoroscopic guidance. Electronically Signed   By: Richarda Overlie M.D.   On: 02/01/2018 13:48   Dg Kayleen Memos  W/kub  Result Date: 02/05/2018 CLINICAL DATA:  Perforated duodenal ulcer, evaluate for sealing EXAM: WATER SOLUBLE UPPER GI SERIES TECHNIQUE: Single-column upper GI series was performed using water soluble contrast. CONTRAST:  Isotonic water-soluble contrast COMPARISON:  Abdominal CT 01/31/2018 FLUOROSCOPY TIME:  Fluoroscopy Time:  3 minutes 6 seconds Radiation Exposure Index (if provided by the fluoroscopic device): 28.1 mGy Number of Acquired Spot Images: 3 FINDINGS: KUB shows a normal bowel gas pattern. The patient had difficulty swallowing recumbent such that took a few swallows via straw sitting up and then was returned recumbent for imaging. The contrast accumulated within the fundus,  requiring right lateral decubitus imaging to advance into the antrum. The stomach was moderately distended by gas. Slow passage of contrast through the pylorus, likely due to edema, with no extravasation seen. Duodenal diverticula are present. A discrete ulcer was not identified. Gastroesophageal reflux incidentally noted. IMPRESSION: 1. Negative for gastric or duodenal extravasation. 2. Numerous duodenal diverticula. Electronically Signed   By: Marnee Spring M.D.   On: 02/05/2018 11:22   Dg Swallowing Func-speech Pathology  Result Date: 02/06/2018 Objective Swallowing Evaluation: Type of Study: MBS-Modified Barium Swallow Study  Patient Details Name: BRENDT DIBLE MRN: [id number] Date of Birth: April 17, 1923 Today's Date: 02/06/2018 Time: SLP Start Time (ACUTE ONLY): 1415 -SLP Stop Time (ACUTE ONLY): 1455 SLP Time Calculation (min) (ACUTE ONLY): 40 min Past Medical History: Past Medical History: Diagnosis Date . Allergy  . Anxiety  . Diverticulosis  . History of colon polyps  . History of hemorrhoids  . History of inguinal herniorrhaphy   right . Hyperlipidemia  . Hypogonadism, male  . Sciatica   right Past Surgical History: Past Surgical History: Procedure Laterality Date . CARPAL TUNNEL RELEASE   . HERNIA REPAIR   HPI: pt is a 84 yo male adm to [facility] with abdomen pain- found to have perforated peptic ulcer.   Today pt with increased ATX at lung bases, WBC elevation.  Swallow evaluation ordered.  Subjective: pt awake in chair Assessment / Plan / Recommendation CHL IP CLINICAL IMPRESSIONS 02/06/2018 Clinical Impression Patient presents with moderately severe oropharyngeal dysphagia mostly characterized by weakness.  Pt as only given approximately 9 boluses of barium - thin, nectar and pudding.  Poor oral transiting/bolus cohesion skills due to weakness noted which results in premature spillage and oral residuals.  Pt also extended head upward to aid oral transiting with liquids of increased viscocity.  In addition,  pharyngeal swallow is weak resulting in residuals that patient intermittently senses.  Multiple swallows with all consistencies assist to decrease residuals but did not full eliminate them - thus increasing pt's aspiration risk.  Although he did NOT aspirate or penetrate, certain he does experience aspiration due to this level of weakness and residuals.  At this time, would recommend to continue clear liquids via tsp only - with pt swallowing multiple times with each tsp.  NO CUPS/STRAWS nor thicker consistencies.  Pt educated to findings/recommendation but uncertain to his level of comprehension.  Thankful pt is for palliative consult as concerned for his ability to meet nutritional needs with level of dysphagia.   SLP Visit Diagnosis Dysphagia, oropharyngeal phase (R13.12) Attention and concentration deficit following -- Frontal lobe and executive function deficit following -- Impact on safety and function Severe aspiration risk;Risk for inadequate nutrition/hydration;Moderate aspiration risk   CHL IP TREATMENT RECOMMENDATION 02/06/2018 Treatment Recommendations Therapy as outlined in treatment plan below   Prognosis 02/06/2018 Prognosis for Safe Diet Advancement Guarded Barriers to Reach Goals -- Barriers/Prognosis Comment -- CHL IP DIET RECOMMENDATION 02/06/2018 SLP Diet Recommendations Thin liquid Liquid Administration via No straw;Spoon Medication Administration Other (Comment) Compensations Small sips/bites;Slow rate;Multiple dry swallows after each bite/sip Postural Changes Remain semi-upright after after feeds/meals (Comment);Seated upright at 90 degrees   CHL IP OTHER RECOMMENDATIONS 02/06/2018 Recommended Consults -- Oral Care Recommendations Oral care QID Other Recommendations Have oral suction available   CHL IP FOLLOW UP RECOMMENDATIONS 02/06/2018 Follow up Recommendations (No Data)   CHL IP FREQUENCY AND DURATION 02/06/2018 Speech Therapy Frequency (ACUTE ONLY) min 1 x/week Treatment Duration 1 week       CHL IP ORAL PHASE 02/06/2018 Oral Phase Impaired Oral - Pudding Teaspoon -- Oral - Pudding Cup -- Oral - Honey Teaspoon -- Oral - Honey Cup -- Oral - Nectar Teaspoon -- Oral - Nectar Cup Weak lingual manipulation;Delayed oral transit;Decreased bolus cohesion;Reduced posterior propulsion Oral - Nectar Straw Weak lingual manipulation;Delayed oral transit;Decreased bolus cohesion;Reduced posterior propulsion Oral - Thin Teaspoon Weak lingual manipulation;Delayed oral transit;Decreased bolus cohesion;Reduced posterior propulsion Oral - Thin Cup Weak lingual manipulation;Delayed oral transit;Decreased bolus cohesion;Reduced posterior propulsion Oral - Thin Straw Weak lingual manipulation;Delayed oral transit;Decreased bolus cohesion;Reduced posterior propulsion Oral - Puree Weak lingual manipulation;Delayed oral transit;Decreased bolus cohesion;Reduced posterior propulsion;Holding of bolus Oral - Mech Soft -- Oral - Regular -- Oral - Multi-Consistency -- Oral - Pill -- Oral Phase - Comment pt extended head upward to aid oral transiting of boluses, excessively delayed with pudding - needing liquids to transit piecemealed portion of bolus  CHL IP PHARYNGEAL PHASE 02/06/2018 Pharyngeal Phase Impaired Pharyngeal- Pudding Teaspoon -- Pharyngeal -- Pharyngeal- Pudding Cup -- Pharyngeal -- Pharyngeal- Honey Teaspoon -- Pharyngeal -- Pharyngeal- Honey Cup -- Pharyngeal -- Pharyngeal- Nectar Teaspoon Pharyngeal residue - valleculae;Pharyngeal residue - pyriform;Reduced tongue base retraction;Reduced epiglottic inversion;Reduced pharyngeal peristalsis;Reduced laryngeal elevation;Reduced airway/laryngeal closure Pharyngeal -- Pharyngeal- Nectar Cup -- Pharyngeal -- Pharyngeal- Nectar Straw Pharyngeal residue - valleculae;Pharyngeal residue - pyriform;Reduced pharyngeal peristalsis;Reduced epiglottic inversion;Reduced tongue base retraction;Reduced laryngeal elevation;Reduced airway/laryngeal closure Pharyngeal -- Pharyngeal- Thin  Teaspoon Pharyngeal residue - valleculae;Pharyngeal residue - pyriform;Reduced pharyngeal peristalsis;Reduced epiglottic inversion;Reduced tongue base retraction;Reduced laryngeal elevation;Reduced airway/laryngeal closure Pharyngeal --  Pharyngeal- Thin Cup Pharyngeal residue - valleculae;Pharyngeal residue - pyriform;Reduced pharyngeal peristalsis;Reduced epiglottic inversion;Reduced tongue base retraction;Reduced laryngeal elevation;Reduced airway/laryngeal closure Pharyngeal -- Pharyngeal- Thin Straw Pharyngeal residue - valleculae;Pharyngeal residue - pyriform;Reduced pharyngeal peristalsis;Reduced epiglottic inversion;Reduced tongue base retraction;Reduced airway/laryngeal closure;Reduced laryngeal elevation Pharyngeal -- Pharyngeal- Puree Pharyngeal residue - valleculae;Pharyngeal residue - pyriform;Reduced epiglottic inversion;Reduced pharyngeal peristalsis;Reduced tongue base retraction;Reduced laryngeal elevation;Reduced airway/laryngeal closure Pharyngeal -- Pharyngeal- Mechanical Soft -- Pharyngeal -- Pharyngeal- Regular -- Pharyngeal -- Pharyngeal- Multi-consistency -- Pharyngeal -- Pharyngeal- Pill -- Pharyngeal -- Pharyngeal Comment pt does not sense residuals, multiple swallows decrease pharyngeal resdiuals but do not fully clear them,  he is at aspiration due to residuals despite not aspirating  CHL IP CERVICAL ESOPHAGEAL PHASE 02/06/2018 Cervical Esophageal Phase Impaired Pudding Teaspoon -- Pudding Cup -- Honey Teaspoon -- Honey Cup -- Nectar Teaspoon -- Nectar Cup -- Nectar Straw -- Thin Teaspoon -- Thin Cup -- Thin Straw -- Puree -- Mechanical Soft -- Regular -- Multi-consistency -- Pill -- Cervical Esophageal Comment -- Chales Abrahams 02/06/2018, 3:00 PM  Donavan Burnet, MS [facility] SLP Acute Rehab Services Pager [phone] Office [phone]               Subjective: Clam, complaints of pain.   Discharge Exam: Vitals:   02/11/18 0500 02/11/18 0600  BP:    Pulse: (!) 103 (!) 103   Resp:    Temp:    SpO2: 96% 95%   Vitals:   02/11/18 0342 02/11/18 0400 02/11/18 0500 02/11/18 0600  BP:  (!) 105/48    Pulse:  (!) 101 (!) 103 (!) 103  Resp:      Temp: 97.9 F (36.6 C) 97.9 F (36.6 C)    TempSrc: Axillary Axillary    SpO2:  97% 96% 95%  Weight:      Height:        General: ill appearing, sleepy.  Cardiovascular: RRR, S1/S2 +, no rubs, no gallops Respiratory: bilateral ronchus.  Abdominal: Distended, tender, tight.  Extremities: edema all 4 extremities.    The results of significant diagnostics from this hospitalization (including imaging, microbiology, ancillary and laboratory) are listed below for reference.     Microbiology: Recent Results (from the past 240 hour(s))  MRSA PCR Screening     Status: None   Collection Time: 02/01/18  3:00 PM  Result Value Ref Range Status   MRSA by PCR NEGATIVE NEGATIVE Final    Comment:        The GeneXpert MRSA Assay (FDA approved for NASAL specimens only), is one component of a comprehensive MRSA colonization surveillance program. It is not intended to diagnose MRSA infection nor to guide or monitor treatment for MRSA infections. Performed at [facility], [address] [place], [place] [address]   Culture, blood (routine x 2)     Status: None   Collection Time: 02/05/18  9:40 AM  Result Value Ref Range Status   Specimen Description   Final    BLOOD RIGHT HAND Performed at [facility], [address], [place], [place] [address]    Special Requests   Final    BOTTLES DRAWN AEROBIC ONLY Blood Culture results may not be optimal due to an inadequate volume of blood received in culture bottles Performed at [facility], [address], [place], [place] [address]    Culture   Final    NO GROWTH 5 DAYS Performed at [facility], [address] [place], [place] [address]  Report Status 02/10/2018 FINAL  Final  Culture, blood (routine x  2)     Status: None   Collection Time: 02/05/18  9:40 AM  Result Value Ref Range Status   Specimen Description   Final    BLOOD RIGHT HAND Performed at [facility], [address] [place], [place] [address]    Special Requests   Final    BOTTLES DRAWN AEROBIC ONLY Blood Culture results may not be optimal due to an inadequate volume of blood received in culture bottles Performed at [facility], [address] [place], [place] [address]    Culture   Final    NO GROWTH 5 DAYS Performed at [facility], [address] [place], [place] [address]    Report Status 02/10/2018 FINAL  Final     Labs: BNP (last 3 results) No results for input(s): BNP in the last 8760 hours. Basic Metabolic Panel: Recent Labs  Lab 02/06/18 0258 02/07/18 0433 02/08/18 0108 02/09/18 0835 02/10/18 0741  NA 139 142 141 140 142  K 4.3 3.2* 3.5 4.3 3.6  CL 112* 116* 115* 110 110  CO2 16* 21* 19* 22 23  GLUCOSE 119* 136* 142* 136* 142*  BUN 35* 41* 40* 44* 50*  CREATININE 0.94 1.05 0.98 1.14 1.51*  CALCIUM 7.8* 6.5* 7.0* 7.7* 8.2*  MG 2.1 1.6* 2.0 2.2  --    Liver Function Tests: No results for input(s): AST, ALT, ALKPHOS, BILITOT, PROT, ALBUMIN in the last 168 hours. No results for input(s): LIPASE, AMYLASE in the last 168 hours. No results for input(s): AMMONIA in the last 168 hours. CBC: Recent Labs  Lab 02/05/18 0356  02/07/18 0635 02/08/18 0108 02/08/18 0905 02/09/18 0835 02/10/18 0704  WBC 20.1*   < > 19.6* QUESTIONABLE RESULTS, RECOMMEND RECOLLECT TO VERIFY 22.1*  QUESTIONABLE RESULTS, RECOMMEND RECOLLECT TO VERIFY 22.2* 29.8*  NEUTROABS 17.3*  --  16.8*  --   --   --   --   HGB 14.5   < > 14.5 QUESTIONABLE RESULTS, RECOMMEND RECOLLECT TO VERIFY 14.6  QUESTIONABLE RESULTS, RECOMMEND RECOLLECT TO VERIFY 14.7 15.3  HCT 44.3   < > 42.7 QUESTIONABLE RESULTS, RECOMMEND RECOLLECT TO VERIFY 44.4  QUESTIONABLE RESULTS, RECOMMEND RECOLLECT TO  VERIFY 43.6 45.7  MCV 90.4   < > 87.0 QUESTIONABLE RESULTS, RECOMMEND RECOLLECT TO VERIFY 90.4  QUESTIONABLE RESULTS, RECOMMEND RECOLLECT TO VERIFY 89.2 88.4  PLT 136*   < > 171 QUESTIONABLE RESULTS, RECOMMEND RECOLLECT TO VERIFY 193  QUESTIONABLE RESULTS, RECOMMEND RECOLLECT TO VERIFY 210 242   < > = values in this interval not displayed.   Cardiac Enzymes: No results for input(s): CKTOTAL, CKMB, CKMBINDEX, TROPONINI in the last 168 hours. BNP: Invalid input(s): POCBNP CBG: Recent Labs  Lab 02/07/18 0741  GLUCAP 122*   D-Dimer No results for input(s): DDIMER in the last 72 hours. Hgb A1c No results for input(s): HGBA1C in the last 72 hours. Lipid Profile No results for input(s): CHOL, HDL, LDLCALC, TRIG, CHOLHDL, LDLDIRECT in the last 72 hours. Thyroid function studies No results for input(s): TSH, T4TOTAL, T3FREE, THYROIDAB in the last 72 hours.  Invalid input(s): FREET3 Anemia work up No results for input(s): VITAMINB12, FOLATE, FERRITIN, TIBC, IRON, RETICCTPCT in the last 72 hours. Urinalysis    Component Value Date/Time   COLORURINE YELLOW 01/31/2018 2315   APPEARANCEUR CLEAR 01/31/2018 2315   LABSPEC 1.039 (H) 01/31/2018 2315   PHURINE 5.0 01/31/2018 2315   GLUCOSEU NEGATIVE 01/31/2018  2315   HGBUR SMALL (A) 01/31/2018 2315   BILIRUBINUR NEGATIVE 01/31/2018 2315   KETONESUR NEGATIVE 01/31/2018 2315   PROTEINUR NEGATIVE 01/31/2018 2315   NITRITE NEGATIVE 01/31/2018 2315   LEUKOCYTESUR NEGATIVE 01/31/2018 2315   Sepsis Labs Invalid input(s): PROCALCITONIN,  WBC,  LACTICIDVEN Microbiology Recent Results (from the past 240 hour(s))  MRSA PCR Screening     Status: None   Collection Time: 02/01/18  3:00 PM  Result Value Ref Range Status   MRSA by PCR NEGATIVE NEGATIVE Final    Comment:        The GeneXpert MRSA Assay (FDA approved for NASAL specimens only), is one component of a comprehensive MRSA colonization surveillance program. It is not intended to  diagnose MRSA infection nor to guide or monitor treatment for MRSA infections. Performed at [facility], [address] [place], [place] [address]   Culture, blood (routine x 2)     Status: None   Collection Time: 02/05/18  9:40 AM  Result Value Ref Range Status   Specimen Description   Final    BLOOD RIGHT HAND Performed at [facility], [address], [place], [place] [address]    Special Requests   Final    BOTTLES DRAWN AEROBIC ONLY Blood Culture results may not be optimal due to an inadequate volume of blood received in culture bottles Performed at [facility], [address], [place], [place] [address]    Culture   Final    NO GROWTH 5 DAYS Performed at [facility], [address], [place], [place] [address]    Report Status 02/10/2018 FINAL  Final  Culture, blood (routine x 2)     Status: None   Collection Time: 02/05/18  9:40 AM  Result Value Ref Range Status   Specimen Description   Final    BLOOD RIGHT HAND Performed at [facility], [address], [place], [place] [address]    Special Requests   Final    BOTTLES DRAWN AEROBIC ONLY Blood Culture results may not be optimal due to an inadequate volume of blood received in culture bottles Performed at [facility], [address] [place], [place] [address]    Culture   Final    NO GROWTH 5 DAYS Performed at [facility], [address], [place], [place] [address]    Report Status 02/10/2018 FINAL  Final     Time coordinating discharge: 35 minutes.   SIGNED:   Alba Cory, MD  [facility] 02/11/2018, 8:05 AM Pager   If 7PM-7AM, please contact night-coverage [email or web address] Password [email or web address]

## 2018-02-11 NOTE — Progress Notes (Signed)
[facility] Liaison RN note '@1000'$    Received request from [place] for family interest in [facility] with request for transfer today. Chart reviewed and received report from bedside RN Helene Kelp). Met with patient and family (3 daughters) to confirm interest and explain services. Family agreeable to transfer today. CSW aware.  Registration paper work completed (via daughter Loletha Carrow)  at 40 today.  Dr. Orpah Melter to assume care per family request. Please fax discharge summary to [phone]. RN please call report to [phone]. Please arrange transport for patient to arrive after 1100 tpday if possible.  Thank you.   Vicente Serene, [facility] Liaison  [place] are on AMION

## 2018-02-12 ENCOUNTER — Telehealth: Payer: Self-pay | Admitting: *Deleted

## 2018-03-07 NOTE — Telephone Encounter (Signed)
Pt was on TCM report admitted 01/31/18 for increased abdomen pain had CT which showed a highly suspicious for for perforated peptic ulcer with a small foci of extraluminal air in the porta hepatis. Patient also developed A. fib with RVR,  he was evaluated by cardiology, and was a started on amiodarone infusion, IV Cardizem. Patient developed worsening lactic acidosis, continues to have leukocytosis. Patient develop portal vein thrombosis, treated with heparin Gtt. Develop worsening abdominal distension and worsening leukocytosi. He continue to decline, and family agree with comfort care and [facility]. Pt D/C 02/11/18 [facility] for comfort measures.Marland Kitchen.Raechel Chute/lmb

## 2018-03-07 DEATH — deceased

## 2018-04-30 ENCOUNTER — Telehealth: Payer: Self-pay | Admitting: Internal Medicine

## 2018-04-30 NOTE — Telephone Encounter (Signed)
FYI - routing to medical records as well.

## 2018-04-30 NOTE — Telephone Encounter (Signed)
Copied from CRM [phone]. Topic: General - Other >> Apr 30, 2018  2:35 PM Burchel, Abbi R wrote: Reason for CRM:   Pt's daughter called to notify the office that pt is deceased.  She would like to request that no other phone calls be made to her in regards to this pt.

## 2020-07-09 IMAGING — CT CT HEAD W/O CM
3 series · 16 of 47 positions shown, 19 images · non-contrast
Comparison: None.

CLINICAL DATA: Disorientation.

EXAM:
CT HEAD WITHOUT CONTRAST
TECHNIQUE: Contiguous axial images were obtained from the base of the skull
through the vertex without intravenous contrast.

[Series 2: head wo · axial · 0.46mm/px · z∈[+1442,+1577]mm · 10 of 33 slices shown, 13 images]
[im 3/33  brain]
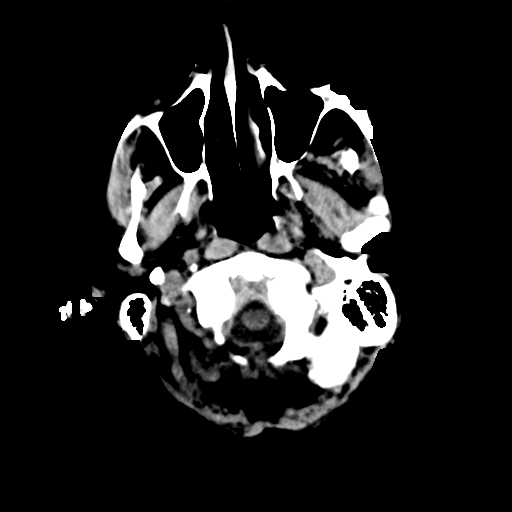
[im 3/33  bone]
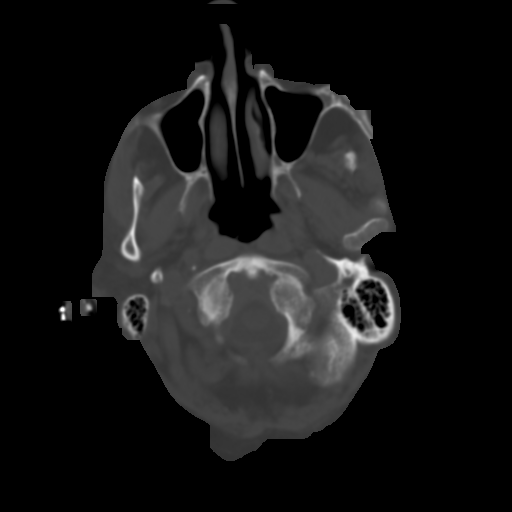
[im 6/33  brain]
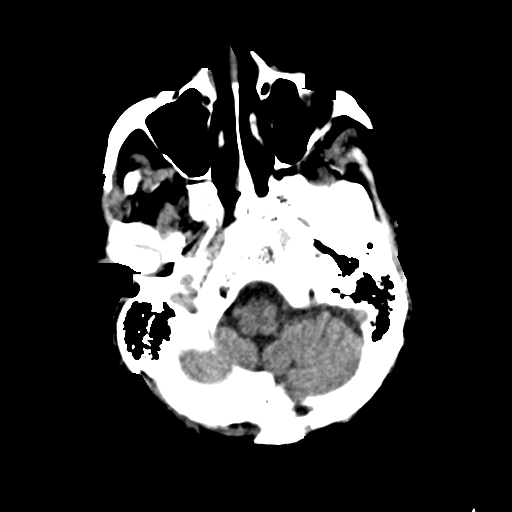
[im 9/33  brain]
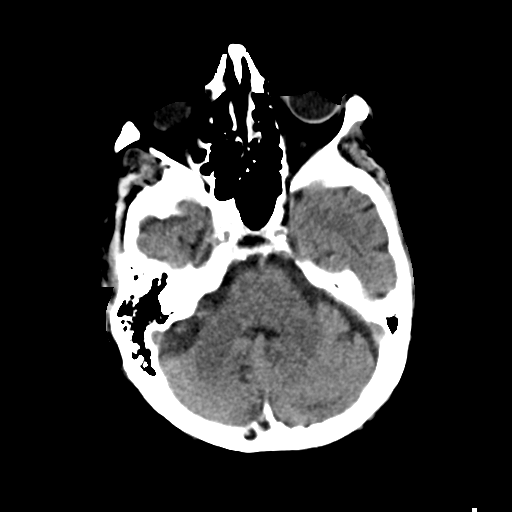
[im 12/33  brain]
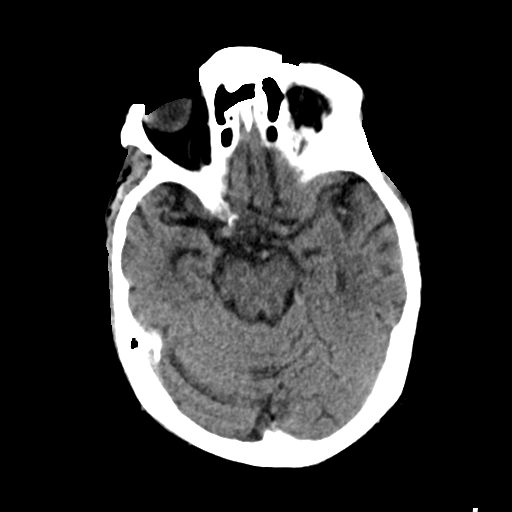
[im 15/33  brain]
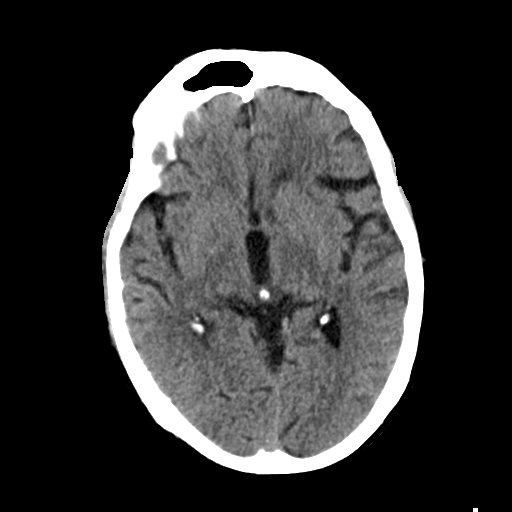
[im 15/33  bone]
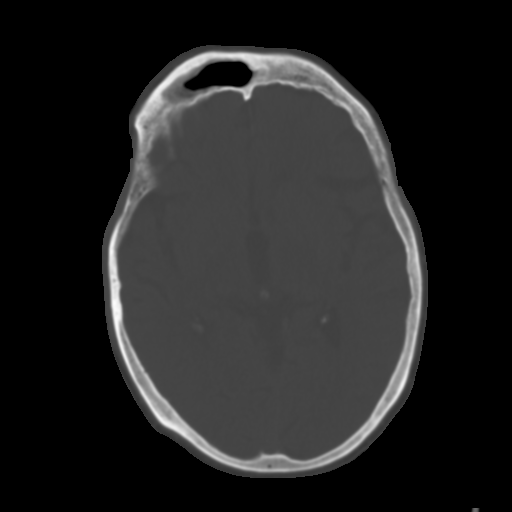
[im 18/33  brain]
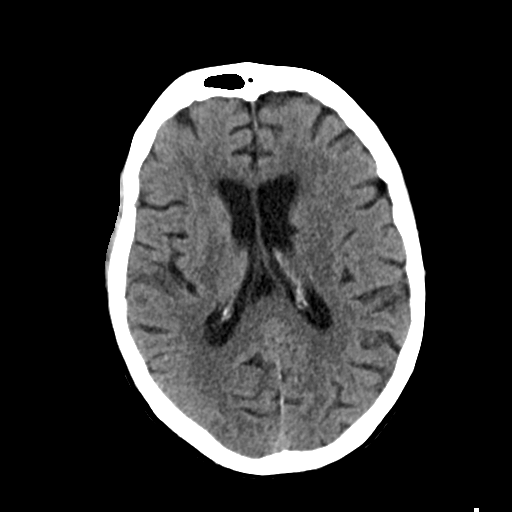
[im 21/33  brain]
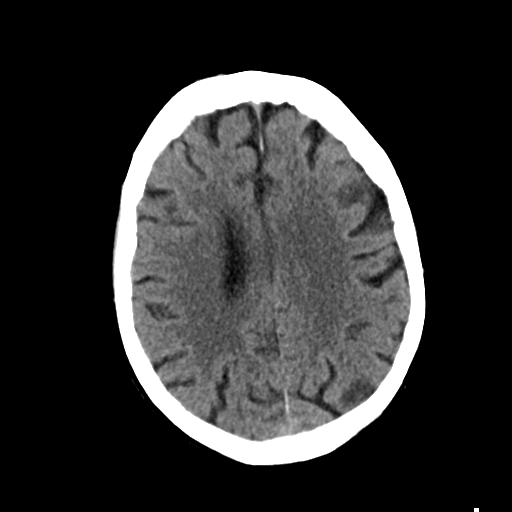
[im 25/33  brain]
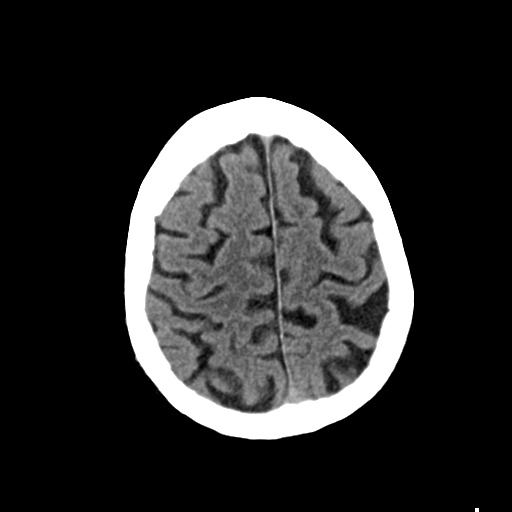
[im 27/33  brain]
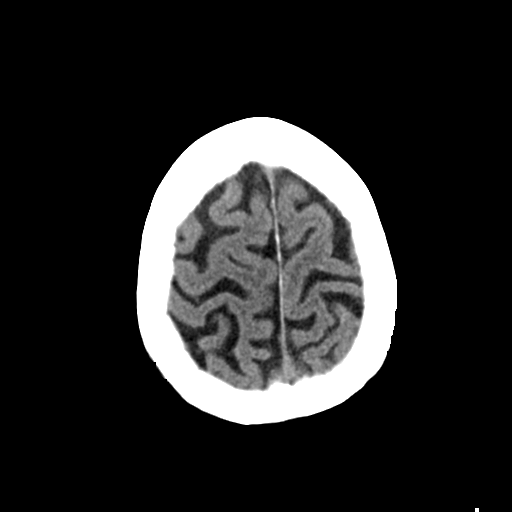
[im 27/33  bone]
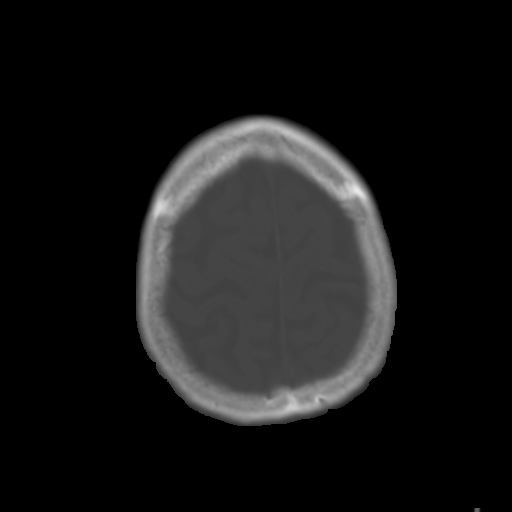
[im 30/33  brain]
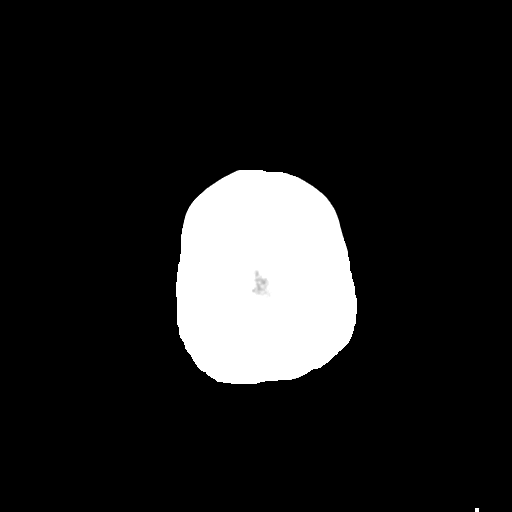

[Series 4: coronal soft tissue · coronal · 0.32mm/px · 3 of 71 slices shown]
[im 24/71  brain]
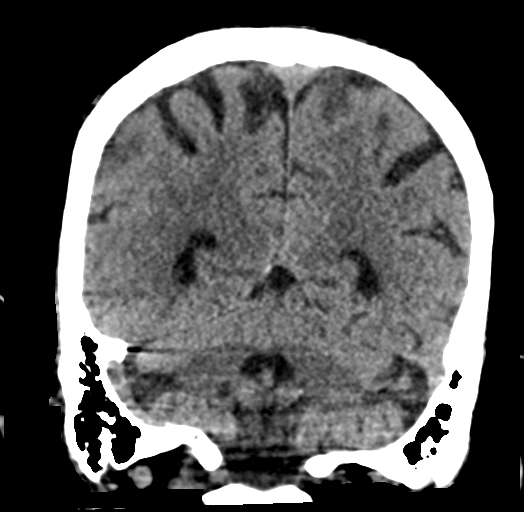
[im 32/71  brain]
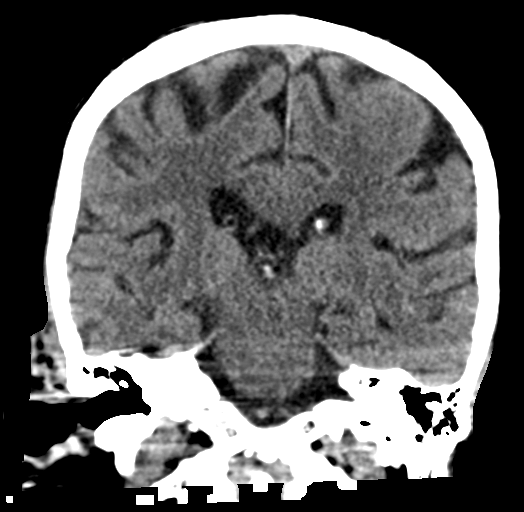
[im 39/71  brain]
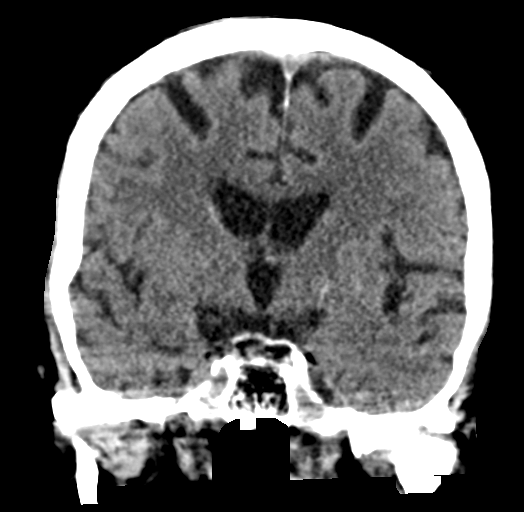

[Series 5: sagittal soft tissue · sagittal · 0.33mm/px · 3 of 62 slices shown]
[im 21/62  brain]
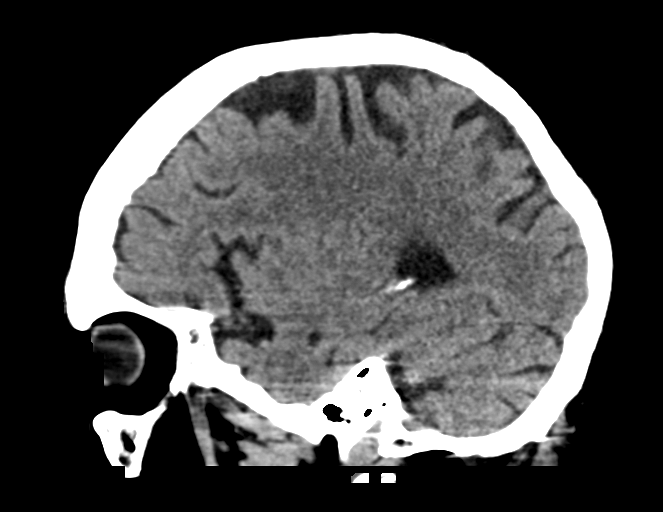
[im 31/62  brain]
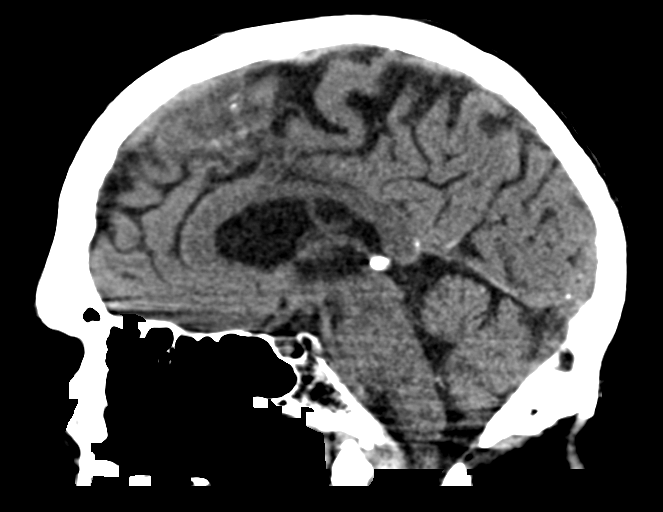
[im 41/62  brain]
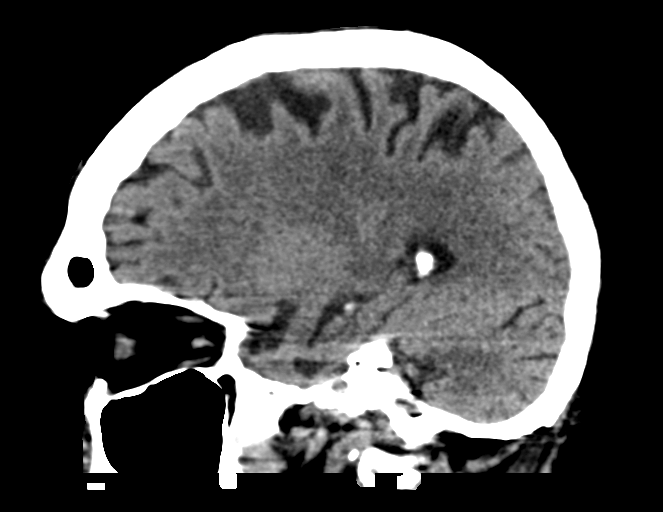

[16 of 47 positions shown; findings below may reference images not displayed]

FINDINGS: Brain: There is no evidence of acute infarct, intracranial
hemorrhage, mass, midline shift, or extra-axial fluid collection.
There is a chronic lacunar infarct in the left caudate nucleus.
Scattered cerebral white matter hypodensities are nonspecific but
compatible with mild chronic small vessel ischemic disease. Mild
cerebral atrophy is not greater than expected for age.

Vascular: Calcified atherosclerosis at the skull base. No hyperdense
vessel.

Skull: No fracture or suspicious osseous lesion.

Sinuses/Orbits: No acute findings. Bilateral cataract extraction.

Other: None.
IMPRESSION: 1. No evidence of acute intracranial abnormality.
2. Mild chronic small vessel ischemic disease.

## 2020-07-10 IMAGING — DX DG CHEST 1V PORT
1 series · 1 of 1 positions shown · non-contrast
Comparison: 01/31/2018

CLINICAL DATA: Hypoxia.  Right pleural effusion.  Emphysema.

EXAM:
PORTABLE CHEST 1 VIEW

[chest ap]
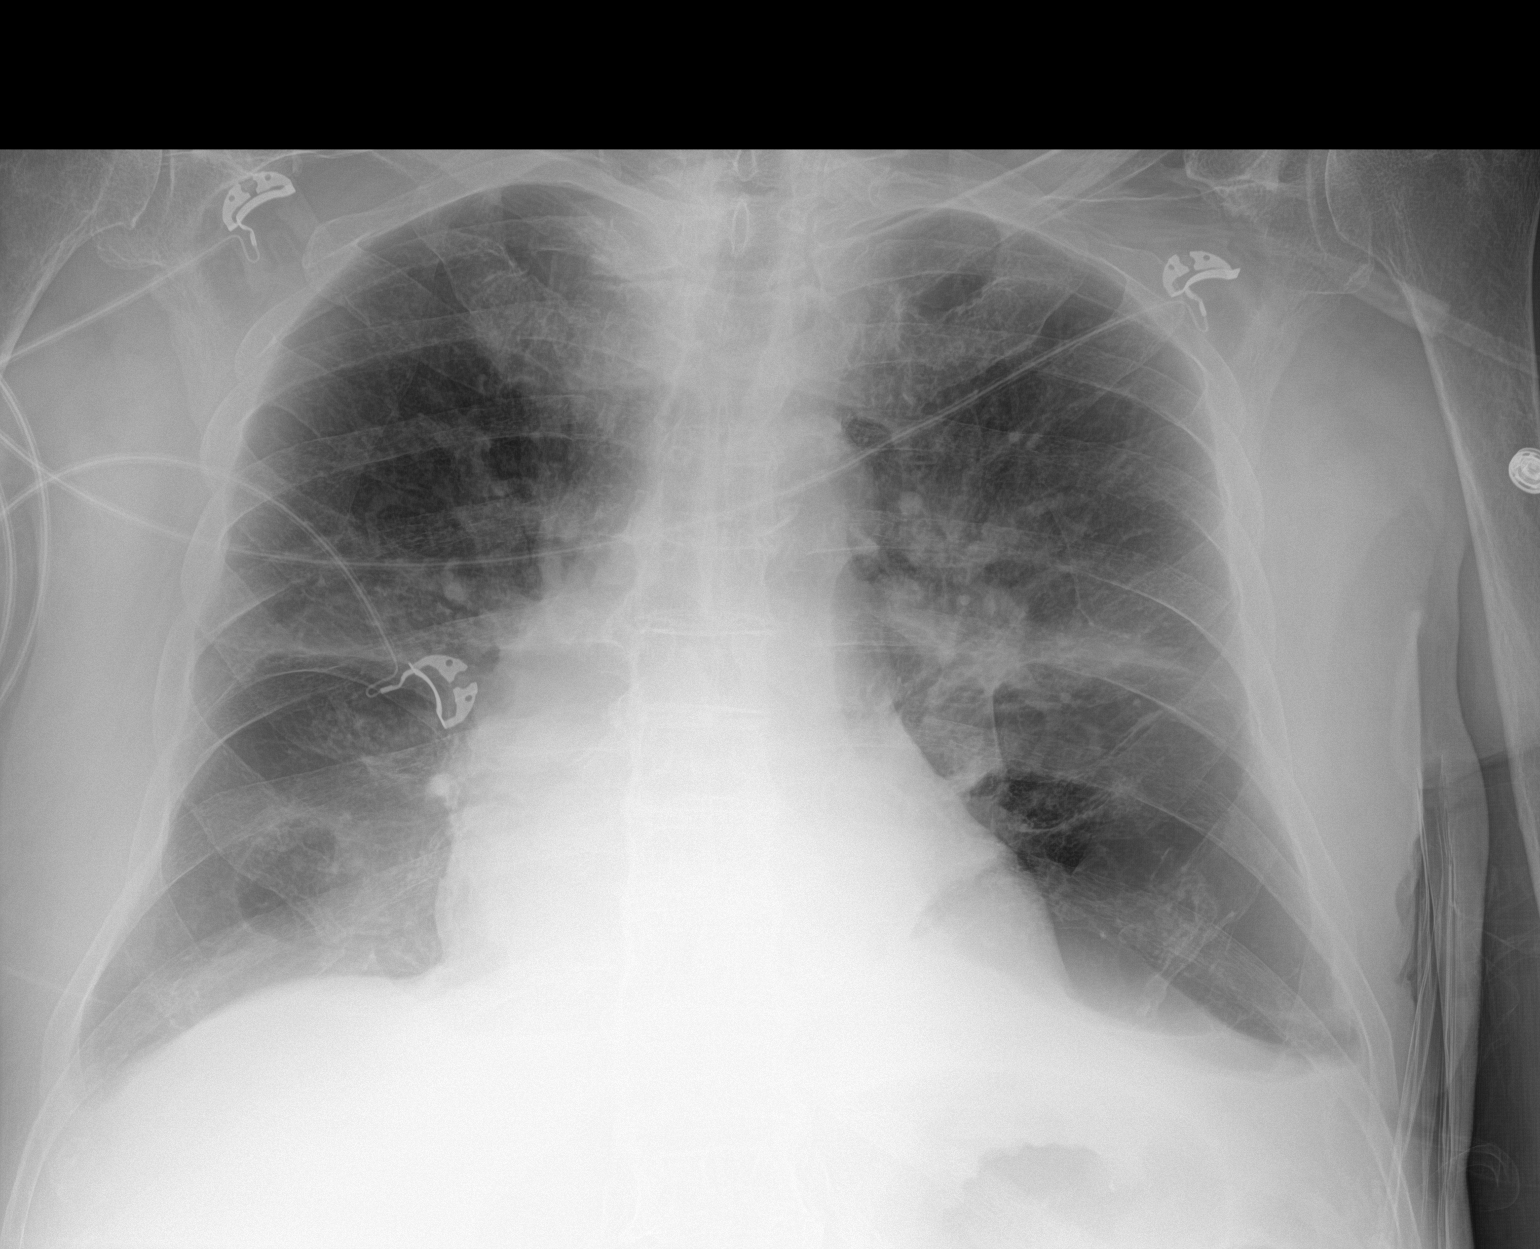

[1 of 1 positions shown; findings below may reference images not displayed]

FINDINGS: Heart size remains normal. Today's film is lordotically positioned.
Question increased density in the lower lobes bilaterally which
could go along with some worsened atelectasis. Background scarring
and emphysematous change as seen previously.
IMPRESSION: Question worsened atelectasis in both lower lungs. Lordotically
position film.

## 2020-07-13 IMAGING — DX DG CHEST 1V PORT
1 series · 1 of 1 positions shown · non-contrast
Comparison: 02/05/2018 and earlier. CT Abdomen and Pelvis
02/07/2018.

CLINICAL DATA: [AGE] male with shortness of breath. Recent
portal venous thrombus, suspected perforated peptic ulcer.

EXAM:
PORTABLE CHEST 1 VIEW

[chest ap]
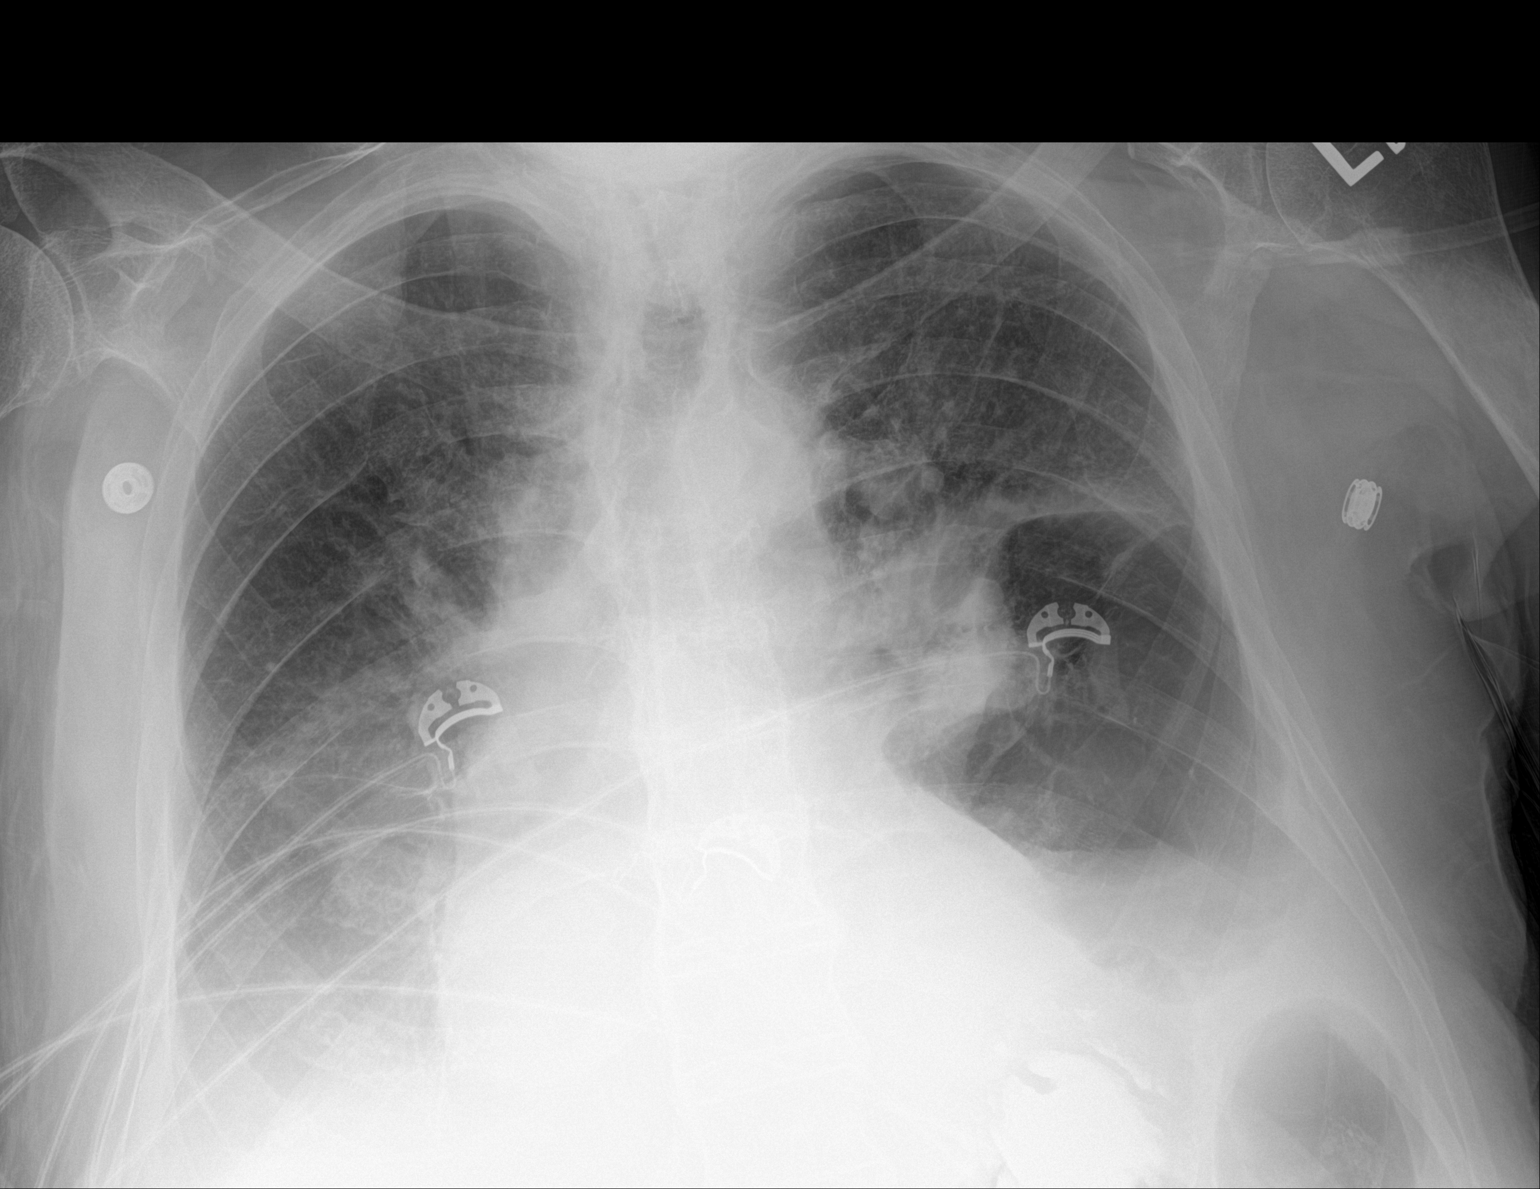

[1 of 1 positions shown; findings below may reference images not displayed]

FINDINGS: Portable AP semi upright view at 6846 hours. Layering and partially
loculated pleural effusions demonstrated at the lung bases yesterday
by CT with atelectasis. Superimposed gas trapping in the left lower
lobe. Stable ventilation since 02/05/2018. No pneumothorax. Stable
cardiac size and mediastinal contours. Retained barium type contrast
in the left upper abdomen.
IMPRESSION: Bilateral pleural effusions, partially loculated on the left and
with gas trapping in the left lower lobe. Ventilation is stable
since 02/05/2018.

## 2020-07-14 IMAGING — DX DG ABDOMEN 1V
2 series · 2 of 2 positions shown · non-contrast
Comparison: Abdomen pelvis CT dated 02/07/2018.

CLINICAL DATA: Abdominal pain and distension.

EXAM:
ABDOMEN - 1 VIEW

[abdomen kub (1 of 2)]
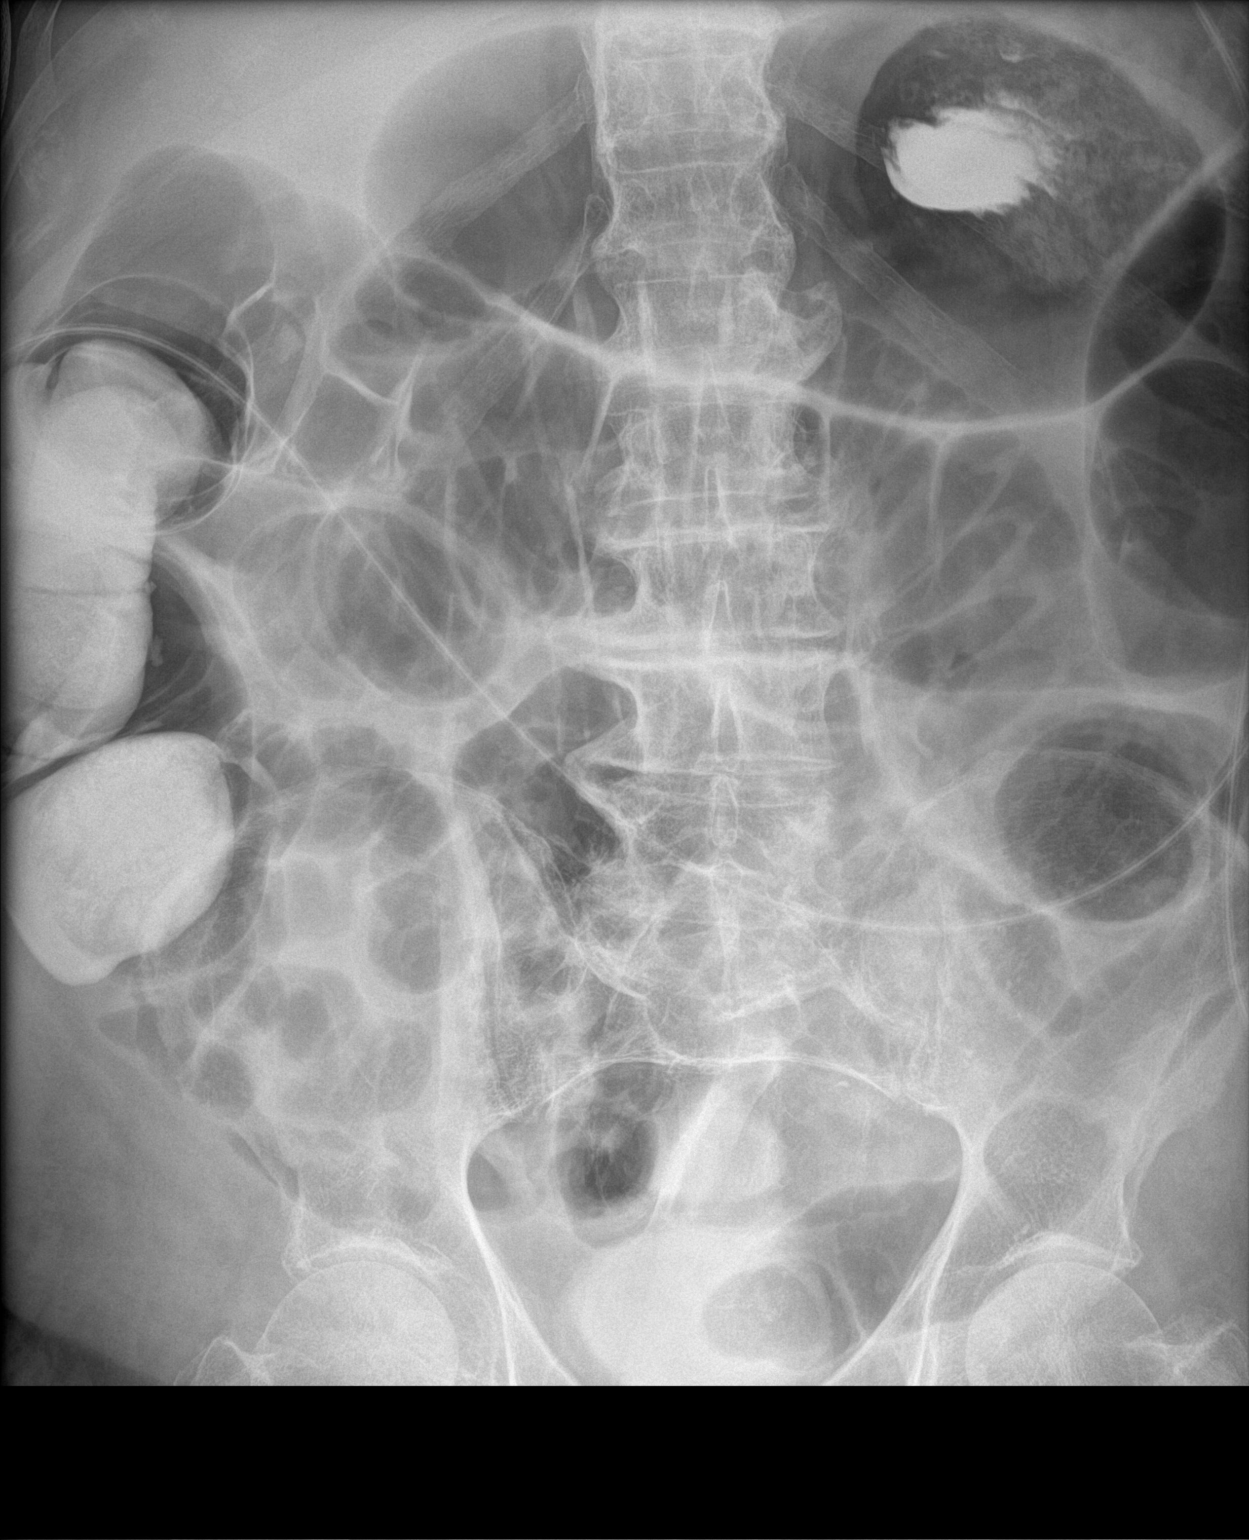

[abdomen kub (2 of 2)]
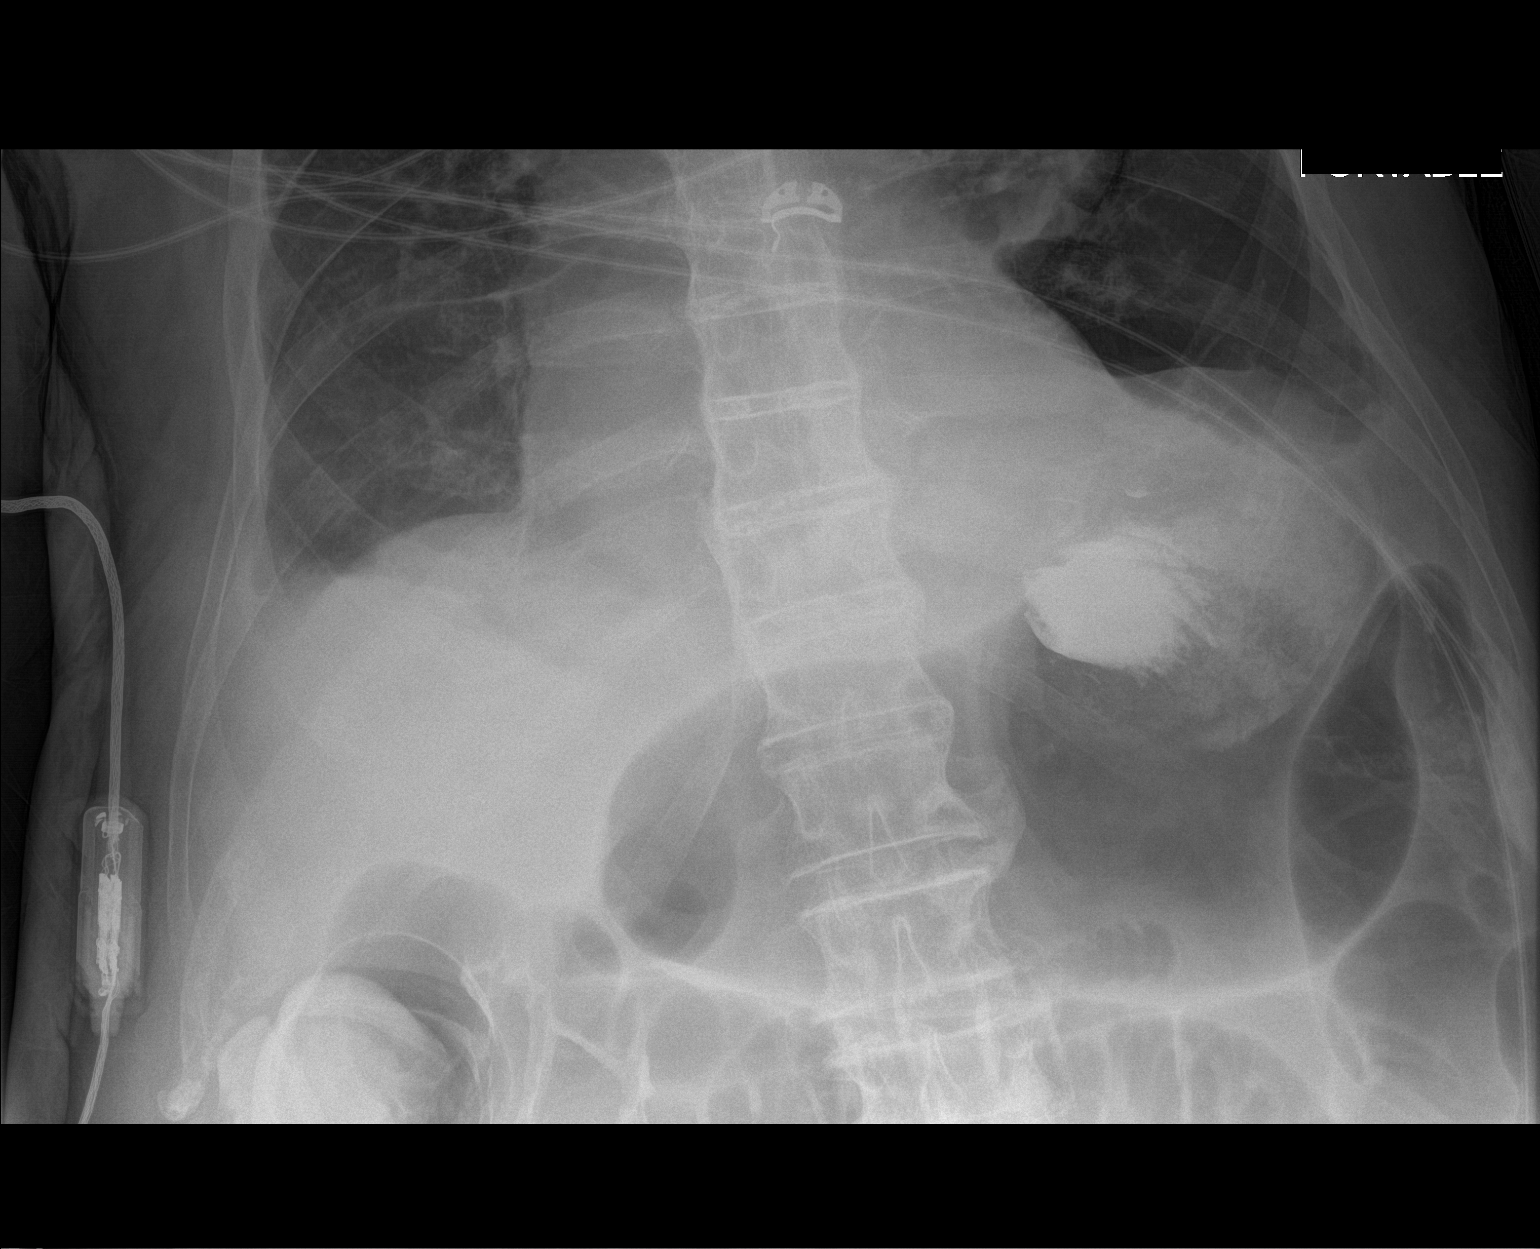

[2 of 2 positions shown; findings below may reference images not displayed]

FINDINGS: Dilated proximal small bowel loops with mild overall improvement and
normal caliber distal small bowel loops. Gas and contrast in mildly
dilated colon. The stomach remains mildly dilated with a small
amount of contrast remaining. Excreted contrast in the urinary
bladder. Small bilateral pleural effusions. Lumbar and thoracic
spine degenerative changes.
IMPRESSION: 1. Mildly improved partial small bowel obstruction.
2. Stable probable colonic ileus.
3. Small bilateral pleural effusions.
